# Patient Record
Sex: Female | Born: 1935 | Race: Black or African American | Hispanic: No | Smoking: Former smoker
Health system: Southern US, Community
[De-identification: ages and names within clinical notes are randomized; demographics above are authoritative.]

## PROBLEM LIST (undated history)

## (undated) DIAGNOSIS — E119 Type 2 diabetes mellitus without complications: Secondary | ICD-10-CM

## (undated) DIAGNOSIS — M1990 Unspecified osteoarthritis, unspecified site: Secondary | ICD-10-CM

## (undated) DIAGNOSIS — C801 Malignant (primary) neoplasm, unspecified: Secondary | ICD-10-CM

## (undated) DIAGNOSIS — M48061 Spinal stenosis, lumbar region without neurogenic claudication: Secondary | ICD-10-CM

## (undated) DIAGNOSIS — M549 Dorsalgia, unspecified: Secondary | ICD-10-CM

## (undated) DIAGNOSIS — I10 Essential (primary) hypertension: Secondary | ICD-10-CM

## (undated) HISTORY — PX: ABDOMINAL HYSTERECTOMY: SHX81

## (undated) HISTORY — DX: Spinal stenosis, lumbar region without neurogenic claudication: M48.061

## (undated) HISTORY — DX: Type 2 diabetes mellitus without complications: E11.9

## (undated) HISTORY — DX: Unspecified osteoarthritis, unspecified site: M19.90

---

## 2004-06-22 ENCOUNTER — Ambulatory Visit: Payer: Self-pay | Admitting: Specialist

## 2004-07-19 ENCOUNTER — Ambulatory Visit: Payer: Self-pay | Admitting: Specialist

## 2005-06-27 ENCOUNTER — Ambulatory Visit: Payer: Self-pay | Admitting: Specialist

## 2005-07-04 ENCOUNTER — Ambulatory Visit: Payer: Self-pay | Admitting: Specialist

## 2005-10-17 ENCOUNTER — Ambulatory Visit: Payer: Self-pay | Admitting: Internal Medicine

## 2005-10-23 ENCOUNTER — Ambulatory Visit: Payer: Self-pay | Admitting: Internal Medicine

## 2006-02-24 ENCOUNTER — Emergency Department: Payer: Self-pay | Admitting: General Practice

## 2006-05-15 ENCOUNTER — Ambulatory Visit: Payer: Self-pay | Admitting: Rheumatology

## 2006-07-24 ENCOUNTER — Ambulatory Visit: Payer: Self-pay | Admitting: Pain Medicine

## 2006-07-30 ENCOUNTER — Ambulatory Visit: Payer: Self-pay | Admitting: Pain Medicine

## 2006-09-04 ENCOUNTER — Ambulatory Visit: Payer: Self-pay | Admitting: Pain Medicine

## 2006-09-24 ENCOUNTER — Ambulatory Visit: Payer: Self-pay | Admitting: Pain Medicine

## 2006-11-27 ENCOUNTER — Ambulatory Visit: Payer: Self-pay | Admitting: Pain Medicine

## 2008-12-04 ENCOUNTER — Encounter: Payer: Self-pay | Admitting: Anesthesiology

## 2008-12-17 ENCOUNTER — Encounter: Payer: Self-pay | Admitting: Anesthesiology

## 2009-01-16 ENCOUNTER — Encounter: Payer: Self-pay | Admitting: Anesthesiology

## 2009-02-16 ENCOUNTER — Encounter: Payer: Self-pay | Admitting: Anesthesiology

## 2010-09-25 ENCOUNTER — Emergency Department: Payer: Self-pay | Admitting: Internal Medicine

## 2012-01-20 ENCOUNTER — Emergency Department: Payer: Self-pay | Admitting: *Deleted

## 2014-03-25 ENCOUNTER — Ambulatory Visit: Payer: Self-pay | Admitting: Ophthalmology

## 2014-04-06 ENCOUNTER — Ambulatory Visit: Payer: Self-pay | Admitting: Ophthalmology

## 2014-07-17 ENCOUNTER — Emergency Department: Payer: Self-pay | Admitting: Emergency Medicine

## 2014-10-14 ENCOUNTER — Encounter: Payer: Self-pay | Admitting: *Deleted

## 2014-10-22 ENCOUNTER — Ambulatory Visit: Payer: Self-pay | Admitting: General Surgery

## 2014-10-26 ENCOUNTER — Ambulatory Visit: Payer: Self-pay | Admitting: General Surgery

## 2015-01-09 NOTE — Op Note (Signed)
PATIENT NAME:  Ruth Rhodes, Ruth Rhodes MR#:  [id number] DATE OF BIRTH:  Jun 11, 1934  DATE OF PROCEDURE:  04/06/2014  PREOPERATIVE DIAGNOSIS:  Cataract, left eye.    POSTOPERATIVE DIAGNOSIS:  Cataract, left eye.  PROCEDURE PERFORMED:  Extracapsular cataract extraction using phacoemulsification with  placement of an Alcon [id number], 22.0-diopter posterior chamber lens, serial #[id number].  SURGEON:  Loura Back. Chayton Murata, MD  ASSISTANT:  None.  ANESTHESIA:  4% lidocaine and 0.75% Marcaine in a 50/50 mixture with 10 units/mL of Hylenex added, given as a peribulbar.   ANESTHESIOLOGIST: Dr. Benjamine Mola.  COMPLICATIONS:  None.  ESTIMATED BLOOD LOSS:  Less than 1 ml.  DESCRIPTION OF PROCEDURE:  The patient was brought to the operating room and given a peribulbar block.  The patient was then prepped and draped in the usual fashion.  The vertical rectus muscles were imbricated using 5-0 silk sutures.  These sutures were then clamped to the sterile drapes as bridle sutures.  A limbal peritomy was performed extending two clock hours and hemostasis was obtained with cautery.  A partial thickness scleral groove was made at the surgical limbus and dissected anteriorly in a lamellar dissection using an Alcon crescent knife.  The anterior chamber was entered supero-temporally with a Superblade and through the lamellar dissection with a 2.6 mm keratome.  DisCoVisc was used to replace the aqueous and a continuous tear capsulorrhexis was carried out.  Hydrodissection and hydrodelineation were carried out with balanced salt and a 27 gauge canula.  The nucleus was rotated to confirm the effectiveness of the hydrodissection.  Phacoemulsification was carried out using a divide-and-conquer technique.  Total ultrasound time was 1 minutes and 37.6 seconds with an average power of 23.8 percent and CDE of 36.72.  Irrigation/aspiration was used to remove the residual cortex.  DisCoVisc was used to inflate the capsule and the internal  incision was enlarged to 3 mm with the crescent knife.  The intraocular lens was folded and inserted into the capsular bag using the AcrySert delivery system. Irrigation/aspiration was used to remove the residual DisCoVisc.  Miostat was injected into the anterior chamber through the paracentesis track to inflate the anterior chamber and induce miosis. A tenth of a milliliter of cefuroxime was injected via the paracentesis tract containing 1 mg of drug. The wound was checked for leaks and none were found. The conjunctiva was closed with cautery and the bridle sutures were removed.  Two drops of 0.3% Vigamox were placed on the eye.   An eye shield was placed on the eye.  The patient was discharged to the recovery room in good condition.   ____________________________ Loura Back Nero Sawatzky, MD sad:sb D: 04/06/2014 10:54:50 ET T: 04/06/2014 11:20:02 ET JOB#: [id number]  cc: Remo Lipps A. Bowden Boody, MD, <Dictator> Martie Lee MD ELECTRONICALLY SIGNED 04/06/2014 12:35

## 2015-04-16 ENCOUNTER — Emergency Department
Admission: EM | Admit: 2015-04-16 | Discharge: 2015-04-17 | Disposition: A | Discharge status: Home / Self Care | Payer: Medicare Other | Attending: Emergency Medicine | Admitting: Emergency Medicine

## 2015-04-16 ENCOUNTER — Encounter: Payer: Self-pay | Admitting: *Deleted

## 2015-04-16 DIAGNOSIS — E119 Type 2 diabetes mellitus without complications: Secondary | ICD-10-CM | POA: Diagnosis not present

## 2015-04-16 DIAGNOSIS — I10 Essential (primary) hypertension: Secondary | ICD-10-CM | POA: Insufficient documentation

## 2015-04-16 DIAGNOSIS — Z79899 Other long term (current) drug therapy: Secondary | ICD-10-CM | POA: Insufficient documentation

## 2015-04-16 DIAGNOSIS — M79662 Pain in left lower leg: Secondary | ICD-10-CM | POA: Insufficient documentation

## 2015-04-16 DIAGNOSIS — R52 Pain, unspecified: Secondary | ICD-10-CM

## 2015-04-16 DIAGNOSIS — M79605 Pain in left leg: Secondary | ICD-10-CM

## 2015-04-16 DIAGNOSIS — Z791 Long term (current) use of non-steroidal anti-inflammatories (NSAID): Secondary | ICD-10-CM | POA: Insufficient documentation

## 2015-04-16 HISTORY — DX: Essential (primary) hypertension: I10

## 2015-04-16 HISTORY — DX: Unspecified osteoarthritis, unspecified site: M19.90

## 2015-04-16 HISTORY — DX: Type 2 diabetes mellitus without complications: E11.9

## 2015-04-16 NOTE — ED Notes (Addendum)
Pt states has left leg pain from knee to foot. Pt states "I have arthritis and it really hurts today." cms intact to toes. Left foot and ankle are swollen, no redness noted.

## 2015-04-17 ENCOUNTER — Encounter: Payer: Self-pay | Admitting: Emergency Medicine

## 2015-04-17 ENCOUNTER — Emergency Department: Payer: Medicare Other

## 2015-04-17 MED ORDER — ACETAMINOPHEN 325 MG PO TABS: 650.0000 mg | ORAL_TABLET | Freq: Four times a day (QID) | ORAL | Status: DC | PRN

## 2015-04-17 MED ORDER — OXYCODONE-ACETAMINOPHEN 5-325 MG PO TABS: 1.0000 | ORAL_TABLET | Freq: Four times a day (QID) | ORAL | Status: DC | PRN

## 2015-04-17 NOTE — ED Notes (Signed)
MD at bedside. 

## 2015-04-17 NOTE — ED Notes (Signed)
Patient transported to Ultrasound 

## 2015-04-17 NOTE — ED Notes (Signed)
Patient with pain and swelling to left lower extremity from knee down to her foot. Patient reports that she has arthritis and that she has problems with her leg swelling. She reports that the pain is worse today.

## 2015-04-17 NOTE — ED Provider Notes (Signed)
[facility] Emergency Department Provider Note  ____________________________________________  Time seen: 12:00 AM  I have reviewed the triage vital signs and the nursing notes.   HISTORY  Chief Complaint Leg Pain    HPI Ruth Rhodes is a 81 y.o. female who complains of pain in the left leg from the knee down to the foot is been going on for about a week. She denies any recent travel trauma hospitalization or surgery. She did have a fall at home 2 weeks ago which made her worried about her independence because she lives at home, but notes that she did not have any pain after that and does not feel that her current symptoms are related. She seen her primary care doctor who told her that it is arthritis and has not done any other things at this time. She denies any fevers chills chest pain shortness of breath. She is able to walk without difficulty. Pain is a cramping feeling in the back of the knee and the calf.     Past Medical History  Diagnosis Date  . Hypertension   . Arthritis   . Diabetes mellitus without complication     There are no active problems to display for this patient.   History reviewed. No pertinent past surgical history.  Current Outpatient Rx  Name  Route  Sig  Dispense  Refill  . clorazepate (TRANXENE) 7.5 MG tablet   Oral   Take 7.5 mg by mouth 2 (two) times daily as needed for anxiety.         . naproxen (NAPROSYN) 500 MG tablet   Oral   Take 500 mg by mouth 2 (two) times daily with a meal.         . nebivolol (BYSTOLIC) 10 MG tablet   Oral   Take 20 mg by mouth daily.         . pregabalin (LYRICA) 100 MG capsule   Oral   Take 100 mg by mouth 2 (two) times daily.         Marland Kitchen acetaminophen (TYLENOL) 325 MG tablet   Oral   Take 2 tablets (650 mg total) by mouth every 6 (six) hours as needed.   60 tablet   0     Allergies Review of patient's allergies indicates no known allergies.  History reviewed. No  pertinent family history.  Social History History  Substance Use Topics  . Smoking status: Unknown If Ever Smoked  . Smokeless tobacco: Never Used  . Alcohol Use: No    Review of Systems  Constitutional: No fever or chills. No weight changes Eyes:No blurry vision or double vision.  ENT: No sore throat. Cardiovascular: No chest pain. Respiratory: No dyspnea or cough. Gastrointestinal: Negative for abdominal pain, vomiting and diarrhea.  No BRBPR or melena. Genitourinary: Negative for dysuria, urinary retention, bloody urine, or difficulty urinating. Musculoskeletal: Left leg pain as above. Skin: Negative for rash. Neurological: Negative for headaches, focal weakness or numbness. Psychiatric:No anxiety or depression.   Endocrine:No hot/cold intolerance, changes in energy, or sleep difficulty.  10-point ROS otherwise negative.  ____________________________________________   PHYSICAL EXAM:  VITAL SIGNS: ED Triage Vitals  Enc Vitals Group     BP 04/16/15 2139 159/65 mmHg     Pulse Rate 04/16/15 2139 73     Resp 04/16/15 2139 14     Temp 04/16/15 2139 98.1 F (36.7 C)     Temp Source 04/16/15 2139 Oral     SpO2 04/16/15 2139  100 %     Weight 04/16/15 2139 130 lb (58.968 kg)     Height 04/16/15 2139 '5\' 2"'$  (1.575 m)     Head Cir --      Peak Flow --      Pain Score 04/16/15 2140 8     Pain Loc --      Pain Edu? --      Excl. in [place]? --      Constitutional: Alert and oriented. Well appearing and in no distress. Eyes: No scleral icterus. No conjunctival pallor. PERRL. EOMI ENT   Head: Normocephalic and atraumatic.   Nose: No congestion/rhinnorhea. No septal hematoma   Mouth/Throat: MMM, no pharyngeal erythema. No peritonsillar mass. No uvula shift.   Neck: No stridor. No SubQ emphysema. No meningismus. Hematological/Lymphatic/Immunilogical: No cervical lymphadenopathy. Cardiovascular: RRR. Normal and symmetric distal pulses are present in all  extremities. No murmurs, rubs, or gallops. Respiratory: Normal respiratory effort without tachypnea nor retractions. Breath sounds are clear and equal bilaterally. No wheezes/rales/rhonchi. Gastrointestinal: Soft and nontender. No distention. There is no CVA tenderness.  No rebound, rigidity, or guarding. Genitourinary: deferred Musculoskeletal: normal range of motion in all extremities. No joint effusions.  No lower extremity tenderness.  No edema. Calf circumference equal on both sides. Compartments soft, no erythema in duration or appreciable swelling. No crepitus. The patient does report tenderness to touch in the calf and behind the knee. No bony tenderness Neurologic:   Normal speech and language.  CN 2-10 normal. Motor grossly intact. No pronator drift.  Normal gait. No gross focal neurologic deficits are appreciated.  Skin:  Skin is warm, dry and intact. No rash noted.  No petechiae, purpura, or bullae. Psychiatric: Mood and affect are normal. Speech and behavior are normal. Patient exhibits appropriate insight and judgment.  ____________________________________________    LABS (pertinent positives/negatives) (all labs ordered are listed, but only abnormal results are displayed) Labs Reviewed - No data to display ____________________________________________   EKG    ____________________________________________    RADIOLOGY  Ultrasound unremarkable  ____________________________________________   PROCEDURES  ____________________________________________   INITIAL IMPRESSION / ASSESSMENT AND PLAN / ED COURSE  Pertinent labs & imaging results that were available during my care of the patient were reviewed by me and considered in my medical decision making (see chart for details).  Patient presents with left leg pain times one week. Exam is unremarkable, low suspicion for soft tissue infection necrotizing fasciitis DVT fracture or dislocation. There may be an element  of musculoskeletal pain from underlying arthritis or muscle or ligamentous strain from the fall, but nothing appreciable on exam at this time. Due to ongoing pain, we'll get an ultrasound to make sure she doesn't have a DVT. I expect she'll go to discharge home and follow-up with her primary care. ----------------------------------------- 4:17 AM on 04/17/2015 -----------------------------------------  Ultrasound unremarkable with discharge home. ____________________________________________   FINAL CLINICAL IMPRESSION(S) / ED DIAGNOSES  Final diagnoses:  Left leg pain      Carrie Mew, MD 04/17/15 [phone]

## 2015-04-17 NOTE — Discharge Instructions (Signed)
Pain of Unknown Etiology (Pain Without a Known Cause) °You have come to your caregiver because of pain. Pain can occur in any part of the body. Often there is not a definite cause. If your laboratory (blood or urine) work was normal and X-rays or other studies were normal, your caregiver may treat you without knowing the cause of the pain. An example of this is the headache. Most headaches are diagnosed by taking a history. This means your caregiver asks you questions about your headaches. Your caregiver determines a treatment based on your answers. Usually testing done for headaches is normal. Often testing is not done unless there is no response to medications. Regardless of where your pain is located today, you can be given medications to make you comfortable. If no physical cause of pain can be found, most cases of pain will gradually leave as suddenly as they came.  °If you have a painful condition and no reason can be found for the pain, it is important that you follow up with your caregiver. If the pain becomes worse or does not go away, it may be necessary to repeat tests and look further for a possible cause. °· Only take over-the-counter or prescription medicines for pain, discomfort, or fever as directed by your caregiver. °· For the protection of your privacy, test results cannot be given over the phone. Make sure you receive the results of your test. Ask how these results are to be obtained if you have not been informed. It is your responsibility to obtain your test results. °· You may continue all activities unless the activities cause more pain. When the pain lessens, it is important to gradually resume normal activities. Resume activities by beginning slowly and gradually increasing the intensity and duration of the activities or exercise. During periods of severe pain, bed rest may be helpful. Lie or sit in any position that is comfortable. °· Ice used for acute (sudden) conditions may be effective.  Use a large plastic bag filled with ice and wrapped in a towel. This may provide pain relief. °· See your caregiver for continued problems. Your caregiver can help or refer you for exercises or physical therapy if necessary. °If you were given medications for your condition, do not drive, operate machinery or power tools, or sign legal documents for 24 hours. Do not drink alcohol, take sleeping pills, or take other medications that may interfere with treatment. °See your caregiver immediately if you have pain that is becoming worse and not relieved by medications. °Document Released: 05/30/2001 Document Revised: 06/25/2013 Document Reviewed: 09/04/2005 °[employer]® Patient Information ©2015 [employer]. This information is not intended to replace advice given to you by your health care provider. Make sure you discuss any questions you have with your health care provider. ° °

## 2015-05-03 ENCOUNTER — Emergency Department
Admission: EM | Admit: 2015-05-03 | Discharge: 2015-05-03 | Disposition: A | Discharge status: Home / Self Care | Payer: Medicare Other | Attending: Emergency Medicine | Admitting: Emergency Medicine

## 2015-05-03 ENCOUNTER — Encounter: Payer: Self-pay | Admitting: Emergency Medicine

## 2015-05-03 DIAGNOSIS — M792 Neuralgia and neuritis, unspecified: Secondary | ICD-10-CM | POA: Diagnosis not present

## 2015-05-03 DIAGNOSIS — G8929 Other chronic pain: Secondary | ICD-10-CM | POA: Diagnosis not present

## 2015-05-03 DIAGNOSIS — Z87891 Personal history of nicotine dependence: Secondary | ICD-10-CM | POA: Diagnosis not present

## 2015-05-03 DIAGNOSIS — E119 Type 2 diabetes mellitus without complications: Secondary | ICD-10-CM | POA: Insufficient documentation

## 2015-05-03 DIAGNOSIS — M545 Low back pain: Secondary | ICD-10-CM | POA: Insufficient documentation

## 2015-05-03 DIAGNOSIS — I10 Essential (primary) hypertension: Secondary | ICD-10-CM | POA: Insufficient documentation

## 2015-05-03 DIAGNOSIS — M79605 Pain in left leg: Secondary | ICD-10-CM | POA: Diagnosis present

## 2015-05-03 MED ORDER — ONDANSETRON 4 MG PO TBDP
4.0000 mg | ORAL_TABLET | Freq: Once | ORAL | Status: AC
  Administered 2015-05-03: 4 mg via ORAL
  Filled 2015-05-03: qty 1

## 2015-05-03 MED ORDER — GABAPENTIN 300 MG PO CAPS: 300.0000 mg | ORAL_CAPSULE | Freq: Every day | ORAL | Status: DC

## 2015-05-03 MED ORDER — OXYCODONE-ACETAMINOPHEN 5-325 MG PO TABS
1.0000 | ORAL_TABLET | Freq: Once | ORAL | Status: AC
  Administered 2015-05-03: 1 via ORAL
  Filled 2015-05-03: qty 1

## 2015-05-03 MED ORDER — OXYCODONE-ACETAMINOPHEN 5-325 MG PO TABS: 1.0000 | ORAL_TABLET | ORAL | Status: DC | PRN

## 2015-05-03 MED ORDER — DIAZEPAM 2 MG PO TABS
1.0000 mg | ORAL_TABLET | Freq: Once | ORAL | Status: AC
  Administered 2015-05-03: 1 mg via ORAL
  Filled 2015-05-03: qty 1

## 2015-05-03 MED ORDER — DIAZEPAM 2 MG PO TABS: 2.0000 mg | ORAL_TABLET | Freq: Three times a day (TID) | ORAL | Status: DC | PRN

## 2015-05-03 MED ORDER — SODIUM CHLORIDE 0.9 % IV BOLUS (SEPSIS): 1000.0000 mL | Freq: Once | INTRAVENOUS | Status: DC

## 2015-05-03 MED ORDER — DIAZEPAM 5 MG/ML IJ SOLN
1.0000 mg | Freq: Once | INTRAMUSCULAR | Status: DC
  Filled 2015-05-03: qty 2

## 2015-05-03 MED ORDER — GABAPENTIN 300 MG PO CAPS
300.0000 mg | ORAL_CAPSULE | Freq: Once | ORAL | Status: AC
  Administered 2015-05-03: 300 mg via ORAL
  Filled 2015-05-03: qty 1

## 2015-05-03 MED ORDER — ONDANSETRON HCL 4 MG/2ML IJ SOLN
4.0000 mg | Freq: Once | INTRAMUSCULAR | Status: DC
  Filled 2015-05-03: qty 2

## 2015-05-03 NOTE — ED Notes (Signed)
Pt in with co "prickling" to inner thighs bilat, no rash noted at this time.

## 2015-05-03 NOTE — ED Notes (Signed)
Pt refused lab draw and IV meds. Pt made aware of risks. Dr. Beather Arbour notified.

## 2015-05-03 NOTE — Discharge Instructions (Signed)
1. Start gabapentin '300mg'$  nightly (#30). 2. Take medicines as needed for pain & muscle spasms (Percocet/Valium #15). 3. Return to the ER for worsening symptoms, persistent vomiting, difficulty breathing or other concerns.  Diabetic Neuropathy Diabetic neuropathy is a nerve disease or nerve damage that is caused by diabetes mellitus. About half of all people with diabetes mellitus have some form of nerve damage. Nerve damage is more common in those who have had diabetes mellitus for many years and who generally have not had good control of their blood sugar (glucose) level. Diabetic neuropathy is a common complication of diabetes mellitus. There are three more common types of diabetic neuropathy and a fourth type that is less common and less understood:   Peripheral neuropathy--This is the most common type of diabetic neuropathy. It causes damage to the nerves of the feet and legs first and then eventually the hands and arms.The damage affects the ability to sense touch.  Autonomic neuropathy--This type causes damage to the autonomic nervous system, which controls the following functions:  Heartbeat.  Body temperature.  Blood pressure.  Urination.  Digestion.  Sweating.  Sexual function.  Focal neuropathy--Focal neuropathy can be painful and unpredictable and occurs most often in older adults with diabetes mellitus. It involves a specific nerve or one area and often comes on suddenly. It usually does not cause long-term problems.  Radiculoplexus neuropathy-- Sometimes called lumbosacral radiculoplexus neuropathy, radiculoplexus neuropathy affects the nerves of the thighs, hips, buttocks, or legs. It is more common in people with type 2 diabetes mellitus and in older men. It is characterized by debilitating pain, weakness, and atrophy, usually in the thigh muscles. CAUSES  The cause of peripheral, autonomic, and focal neuropathies is diabetes mellitus that is uncontrolled and high glucose  levels. The cause of radiculoplexus neuropathy is unknown. However, it is thought to be caused by inflammation related to uncontrolled glucose levels. SIGNS AND SYMPTOMS  Peripheral Neuropathy Peripheral neuropathy develops slowly over time. When the nerves of the feet and legs no longer work there may be:   Burning, stabbing, or aching pain in the legs or feet.  Inability to feel pressure or pain in your feet. This can lead to:  Thick calluses over pressure areas.  Pressure sores.  Ulcers.  Foot deformities.  Reduced ability to feel temperature changes.  Muscle weakness. Autonomic Neuropathy The symptoms of autonomic neuropathy vary depending on which nerves are affected. Symptoms may include:  Problems with digestion, such as:  Feeling sick to your stomach (nausea).  Vomiting.  Bloating.  Constipation.  Diarrhea.  Abdominal pain.  Difficulty with urination. This occurs if you lose your ability to sense when your bladder is full. Problems include:  Urine leakage (incontinence).  Inability to empty your bladder completely (retention).  Rapid or irregular heartbeat (palpitations).  Blood pressure drops when you stand up (orthostatic hypotension). When you stand up you may feel:  Dizzy.  Weak.  Faint.  In men, inability to attain and maintain an erection.  In women, vaginal dryness and problems with decreased sexual desire and arousal.  Problems with body temperature regulation.  Increased or decreased sweating. Focal Neuropathy  Abnormal eye movements or abnormal alignment of both eyes.  Weakness in the wrist.  Foot drop. This results in an inability to lift the foot properly and abnormal walking or foot movement.  Paralysis on one side of your face (Bell palsy).  Chest or abdominal pain. Radiculoplexus Neuropathy  Sudden, severe pain in your hip, thigh, or buttocks.  Weakness and wasting of thigh muscles.  Difficulty rising from a seated  position.  Abdominal swelling.  Unexplained weight loss (usually more than 10 lb [4.5 kg]). DIAGNOSIS  Peripheral Neuropathy Your senses may be tested. Sensory function testing can be done with:  A light touch using a monofilament.  A vibration with tuning fork.  A sharp sensation with a pin prick. Other tests that can help diagnose neuropathy are:  Nerve conduction velocity. This test checks the transmission of an electrical current through a nerve.  Electromyography. This shows how muscles respond to electrical signals transmitted by nearby nerves.  Quantitative sensory testing. This is used to assess how your nerves respond to vibrations and changes in temperature. Autonomic Neuropathy Diagnosis is often based on reported symptoms. Tell your health care provider if you experience:   Dizziness.   Constipation.   Diarrhea.   Inappropriate urination or inability to urinate.   Inability to get or maintain an erection.  Tests that may be done include:   Electrocardiography or Holter monitor. These are tests that can help show problems with the heart rate or heart rhythm.   An X-ray exam may be done. Focal Neuropathy Diagnosis is made based on your symptoms and what your health care provider finds during your exam. Other tests may be done. They may include:  Nerve conduction velocities. This checks the transmission of electrical current through a nerve.  Electromyography. This shows how muscles respond to electrical signals transmitted by nearby nerves.  Quantitative sensory testing. This test is used to assess how your nerves respond to vibration and changes in temperature. Radiculoplexus Neuropathy  Often the first thing is to eliminate any other issue or problems that might be the cause, as there is no stick test for diagnosis.  X-ray exam of your spine and lumbar region.  Spinal tap to rule out cancer.  MRI to rule out other lesions. TREATMENT  Once  nerve damage occurs, it cannot be reversed. The goal of treatment is to keep the disease or nerve damage from getting worse and affecting more nerve fibers. Controlling your blood glucose level is the key. Most people with radiculoplexus neuropathy see at least a partial improvement over time. You will need to keep your blood glucose and HbA1c levels in the target range determined by your health care provider. Things that help control blood glucose levels include:   Blood glucose monitoring.   Meal planning.   Physical activity.   Diabetes medicine.  Over time, maintaining lower blood glucose levels helps lessen symptoms. Sometimes, prescription pain medicine is needed. HOME CARE INSTRUCTIONS:  Do not smoke.  Keep your blood glucose level in the range that you and your health care provider have determined acceptable for you.  Keep your blood pressure level in the range that you and your health care provider have determined acceptable for you.  Eat a well-balanced diet.  Be active every day.  Check your feet every day. SEEK MEDICAL CARE IF:   You have burning, stabbing, or aching pain in the legs or feet.  You are unable to feel pressure or pain in your feet.  You develop problems with digestion such as:  Nausea.  Vomiting.  Bloating.  Constipation.  Diarrhea.  Abdominal pain.  You have difficulty with urination, such as:  Incontinence.  Retention.  You have palpitations.  You develop orthostatic hypotension. When you stand up you may feel:  Dizzy.  Weak.  Faint.  You cannot attain and maintain an erection (  in men).  You have vaginal dryness and problems with decreased sexual desire and arousal (in women).  You have severe pain in your thighs, legs, or buttocks.  You have unexplained weight loss. Document Released: 11/13/2001 Document Revised: 06/25/2013 Document Reviewed: 02/13/2013 [facility] Patient Information 2015 [place], [place]. This  information is not intended to replace advice given to you by your health care provider. Make sure you discuss any questions you have with your health care provider.

## 2015-05-03 NOTE — ED Provider Notes (Signed)
[facility] Emergency Department Provider Note  ____________________________________________  Time seen: Approximately 4:06 AM  I have reviewed the triage vital signs and the nursing notes.   HISTORY  Chief Complaint Leg Pain    HPI Ruth Rhodes is a 81 y.o. female who presents to the ED from home with a chief complaint of "pins and needles" sensation to bilateral inner thighs. Patient states she has a history of osteoarthritis, recently evaluated in the ED s/p recent fall with negative left lower extremity Doppler who awoke prior to arrival with "prickling" sensation to both inner thighs. States she has had this previously. States she is a diabetic and sometimes experiences similar sensations in the bottom of her feet. Denies fever, chills, chest pain, shortness of breath, abdominal pain, vomiting, diarrhea, weakness.   Past Medical History  Diagnosis Date  . Hypertension   . Arthritis   . Diabetes mellitus without complication     There are no active problems to display for this patient.   History reviewed. No pertinent past surgical history.  Current Outpatient Rx  Name  Route  Sig  Dispense  Refill  . clorazepate (TRANXENE) 7.5 MG tablet   Oral   Take 7.5 mg by mouth 2 (two) times daily as needed for anxiety.         . naproxen (NAPROSYN) 500 MG tablet   Oral   Take 500 mg by mouth 2 (two) times daily with a meal.         . nebivolol (BYSTOLIC) 10 MG tablet   Oral   Take 20 mg by mouth daily.         Marland Kitchen oxyCODONE-acetaminophen (ROXICET) 5-325 MG per tablet   Oral   Take 1 tablet by mouth every 6 (six) hours as needed for severe pain.   8 tablet   0   . pregabalin (LYRICA) 100 MG capsule   Oral   Take 100 mg by mouth 2 (two) times daily.           Allergies Review of patient's allergies indicates no known allergies.  History reviewed. No pertinent family history.  Social History Social History  Substance Use Topics   . Smoking status: Former Research scientist (life sciences)  . Smokeless tobacco: Never Used  . Alcohol Use: No    Review of Systems Constitutional: No fever/chills Eyes: No visual changes. ENT: No sore throat. Cardiovascular: Denies chest pain. Respiratory: Denies shortness of breath. Gastrointestinal: No abdominal pain.  No nausea, no vomiting.  No diarrhea.  No constipation. Genitourinary: Negative for dysuria. Musculoskeletal: Positive for chronic back pain. Skin: Positive for prickling sensation to inner thighs bilaterally. Negative for rash. Neurological: Negative for headaches, focal weakness or numbness.  10-point ROS otherwise negative.  ____________________________________________   PHYSICAL EXAM:  VITAL SIGNS: ED Triage Vitals  Enc Vitals Group     BP 05/03/15 0209 169/69 mmHg     Pulse Rate 05/03/15 0209 67     Resp 05/03/15 0209 18     Temp 05/03/15 0209 98.1 F (36.7 C)     Temp Source 05/03/15 0209 Oral     SpO2 05/03/15 0209 97 %     Weight 05/03/15 0208 130 lb (58.968 kg)     Height 05/03/15 0208 '5\' 2"'$  (1.575 m)     Head Cir --      Peak Flow --      Pain Score 05/03/15 0209 10     Pain Loc --      Pain  Edu? --      Excl. in [place]? --     Constitutional: Alert and oriented. Tearful and in mild acute distress. Eyes: Conjunctivae are normal. PERRL. EOMI. Head: Atraumatic. Nose: No congestion/rhinnorhea. Mouth/Throat: Mucous membranes are moist.  Oropharynx non-erythematous. Neck: No stridor.   Cardiovascular: Normal rate, regular rhythm. Grossly normal heart sounds.  Good peripheral circulation. Respiratory: Normal respiratory effort.  No retractions. Lungs CTAB. Gastrointestinal: Soft and nontender. No distention. No abdominal bruits. No CVA tenderness. Musculoskeletal: Bilateral lower extremities symmetrically warm with 2+ femoral, popliteal, DT and PT pulses. Brisk less than 5 second capillary refill. Bilateral calves supple without swelling or tenderness. Negative Homans  sign. Bilateral thighs without evidence of external injury. There is no bruising or swelling to bilateral thighs. Patient complains of pain upon brushing my hand over the skin of her inner thighs. Neurologic:  Normal speech and language. No gross focal neurologic deficits are appreciated. No gait instability. Skin:  Skin is warm, dry and intact. No rash noted. Psychiatric: Mood and affect are anxious. Speech and behavior are normal.  ____________________________________________   LABS (all labs ordered are listed, but only abnormal results are displayed)  Labs Reviewed - No data to display ____________________________________________  EKG  None ____________________________________________  RADIOLOGY  None ____________________________________________   PROCEDURES  Procedure(s) performed: None  Critical Care performed: No  ____________________________________________   INITIAL IMPRESSION / ASSESSMENT AND PLAN / ED COURSE  Pertinent labs & imaging results that were available during my care of the patient were reviewed by me and considered in my medical decision making (see chart for details).  81 year old female complaining of neuropathic pain to bilateral inner thighs. There is no evidence for limb ischemia, compartment syndrome, necrotizing fasciitis or DVT. Will administer IV Valium for both patient's anxiety as well as possible muscle spasms. Will check electrolytes and CK. Administer gabapentin for neuropathic pain.  ----------------------------------------- 5:14 AM on 05/03/2015 -----------------------------------------  Patient refused blood draw and IV. Discussed with patient and spouse; will continue gabapentin, valium and percocet as needed for pain & muscle spasms. Close follow-up with PCP. Strict return precautions given. Both verbalize understanding and agree with plan of care. ____________________________________________   FINAL CLINICAL IMPRESSION(S) / ED  DIAGNOSES  Final diagnoses:  Neuropathic pain      Paulette Blanch, MD 05/03/15 [phone]

## 2015-05-04 ENCOUNTER — Ambulatory Visit (INDEPENDENT_AMBULATORY_CARE_PROVIDER_SITE_OTHER): Payer: Medicare Other | Admitting: Podiatry

## 2015-05-04 ENCOUNTER — Encounter: Payer: Self-pay | Admitting: Podiatry

## 2015-05-04 VITALS — BP 160/97 | HR 67 | Resp 17

## 2015-05-04 DIAGNOSIS — M205X9 Other deformities of toe(s) (acquired), unspecified foot: Secondary | ICD-10-CM

## 2015-05-04 DIAGNOSIS — L84 Corns and callosities: Secondary | ICD-10-CM | POA: Diagnosis not present

## 2015-05-04 DIAGNOSIS — B351 Tinea unguium: Secondary | ICD-10-CM

## 2015-05-04 DIAGNOSIS — M79676 Pain in unspecified toe(s): Secondary | ICD-10-CM | POA: Diagnosis not present

## 2015-05-04 MED ORDER — CICLOPIROX 8 % EX SOLN: Freq: Every day | CUTANEOUS | Status: DC

## 2015-05-04 NOTE — Patient Instructions (Signed)

## 2015-05-04 NOTE — Progress Notes (Signed)
Subjective:     Patient ID: Ruth Rhodes, female   DOB: 02/08/1934, 81 y.o.   MRN: [id number]  HPI  81 year old female presents the office today for concerns of right hallux nail fungus. She also says the remainder nails are also discolored, thick and elongated. She said no prior treatment. She states the nails are painful between the pressure and irritation. Denies any redness or drainage. No other complaints at this time.  Review of Systems  All other systems reviewed and are negative.      Objective:   Physical Exam AAO x3, NAD DP/PT pulses palpable bilaterally, CRT less than 3 seconds Protective sensation intact with Simms Weinstein monofilament, vibratory sensation decreased on the left, Achilles tendon reflex intact  nails are hypertrophic, dystrophic, brittle, discolored, elongated 10. Particularly the right hallux his most significant. There is tenderness to palpation of Nails 1-5 bilaterally. There is no swelling erythema, edema, drainage or other signs of infection. Hyperkeratotic lesions bilateral fifth digits along the dorsal lateral aspect. There is adductovarus deformity of the digits. No other areas of tenderness to bilateral lower extremities. MMT 5/5, ROM WNL.  No Other open lesions or pre-ulcerative lesions.  No overlying edema, erythema, increase in warmth to bilateral lower extremities.  No pain with calf compression, swelling, warmth, erythema bilaterally.      Assessment:      81 year old female with symptomatic onychomycosis, hyperkeratotic lesions     Plan:     -Treatment options discussed including all alternatives, risks, and complications -Nail sharply debrided 10 without complication/bleeding -Hyperkeratotic lesion sharply debrided 2 without complication/bleeding -Discussed the issue we options for onychomycosis. She  Elects to proceed with topical treatment. A prescription for Penlac was given.  Discussed applications instructions as well as side  effects. -Discussed importance daily foot inspection -Follow-up 3 months or sooner if any problems arise. In the meantime, encouraged to call the office with any questions, concerns, change in symptoms.   Celesta Gentile, DPM

## 2015-05-21 ENCOUNTER — Other Ambulatory Visit: Payer: Self-pay | Admitting: Specialist

## 2015-05-21 DIAGNOSIS — M4696 Unspecified inflammatory spondylopathy, lumbar region: Secondary | ICD-10-CM

## 2015-05-26 ENCOUNTER — Ambulatory Visit
Admission: RE | Admit: 2015-05-26 | Discharge: 2015-05-26 | Disposition: A | Discharge status: Home / Self Care | Payer: Medicare Other | Source: Ambulatory Visit | Attending: Specialist | Admitting: Specialist

## 2015-05-26 DIAGNOSIS — M4696 Unspecified inflammatory spondylopathy, lumbar region: Secondary | ICD-10-CM | POA: Diagnosis present

## 2015-05-26 DIAGNOSIS — M5136 Other intervertebral disc degeneration, lumbar region: Secondary | ICD-10-CM | POA: Diagnosis not present

## 2015-05-26 DIAGNOSIS — M2580 Other specified joint disorders, unspecified joint: Secondary | ICD-10-CM | POA: Diagnosis not present

## 2015-05-26 DIAGNOSIS — M4854XA Collapsed vertebra, not elsewhere classified, thoracic region, initial encounter for fracture: Secondary | ICD-10-CM | POA: Insufficient documentation

## 2015-05-26 DIAGNOSIS — R29890 Loss of height: Secondary | ICD-10-CM | POA: Insufficient documentation

## 2015-05-26 DIAGNOSIS — M4806 Spinal stenosis, lumbar region: Secondary | ICD-10-CM | POA: Insufficient documentation

## 2015-07-27 ENCOUNTER — Encounter: Payer: Self-pay | Admitting: Emergency Medicine

## 2015-07-27 ENCOUNTER — Emergency Department
Admission: EM | Admit: 2015-07-27 | Discharge: 2015-07-27 | Disposition: A | Discharge status: Home / Self Care | Payer: Medicare Other | Attending: Emergency Medicine | Admitting: Emergency Medicine

## 2015-07-27 DIAGNOSIS — I10 Essential (primary) hypertension: Secondary | ICD-10-CM | POA: Diagnosis not present

## 2015-07-27 DIAGNOSIS — Z79899 Other long term (current) drug therapy: Secondary | ICD-10-CM | POA: Insufficient documentation

## 2015-07-27 DIAGNOSIS — Z791 Long term (current) use of non-steroidal anti-inflammatories (NSAID): Secondary | ICD-10-CM | POA: Diagnosis not present

## 2015-07-27 DIAGNOSIS — Z87891 Personal history of nicotine dependence: Secondary | ICD-10-CM | POA: Diagnosis not present

## 2015-07-27 DIAGNOSIS — E119 Type 2 diabetes mellitus without complications: Secondary | ICD-10-CM | POA: Insufficient documentation

## 2015-07-27 DIAGNOSIS — M5441 Lumbago with sciatica, right side: Secondary | ICD-10-CM

## 2015-07-27 DIAGNOSIS — M545 Low back pain: Secondary | ICD-10-CM | POA: Diagnosis present

## 2015-07-27 HISTORY — DX: Dorsalgia, unspecified: M54.9

## 2015-07-27 MED ORDER — TAPENTADOL HCL ER 50 MG PO TB12: 50.0000 mg | ORAL_TABLET | Freq: Two times a day (BID) | ORAL | Status: DC

## 2015-07-27 MED ORDER — OXYCODONE-ACETAMINOPHEN 5-325 MG PO TABS
2.0000 | ORAL_TABLET | Freq: Once | ORAL | Status: AC
  Administered 2015-07-27: 2 via ORAL
  Filled 2015-07-27: qty 2

## 2015-07-27 NOTE — ED Notes (Signed)
Bladder scan =185m, pt voided after scan

## 2015-07-27 NOTE — ED Notes (Signed)
Pt to ed with c/o back injection on Thursday of last week.  Pt states increased pain in back since, was seen at [facility] today and told to come to ED.

## 2015-07-27 NOTE — Discharge Instructions (Signed)

## 2015-07-27 NOTE — ED Notes (Signed)
Patient states she was seeing someone At the [facility] for a "accupuncture-like" procedure. Patient got "stuck vertebrae 14 and down". Patient tearful upon questioning. "No one will do anything for me."

## 2015-07-27 NOTE — ED Provider Notes (Addendum)
[facility] Emergency Department Provider Note  Time seen: 5:26 PM  I have reviewed the triage vital signs and the nursing notes.   HISTORY  Chief Complaint Back Pain    HPI Ruth Rhodes is a 81 y.o. female with a past medical history of hypertension, arthritis, diabetes who presents the emergency department with back pain. Patient states she had a fall approximately 2 months ago. She had an MRI done at that time showing a T12 compression fracture and several areas of canal stenosis. Has been seeing a specialist and had an epidural injection performed 6 days ago, at [facility]. Patient states after the injection she was doing much better, but her pain is coming back once again. Patient admits she was prescribed OxyContin, but is not taking it because she did not want to become addicted to the medication. States occasional incontinence, but states this is because of pain and she is not able to get to the bathroom in time. States occasional tingling in her legs, but denies any new or acute tingling. Patient is able to walk but states it hurts when she walks. Describes her pain as moderate to severe, located in the lower back.    Past Medical History  Diagnosis Date  . Hypertension   . Arthritis   . Diabetes mellitus without complication ([place])   . Back pain     There are no active problems to display for this patient.   History reviewed. No pertinent past surgical history.  Current Outpatient Rx  Name  Route  Sig  Dispense  Refill  . BYSTOLIC 20 MG TABS                 Dispense as written.   . ciclopirox (PENLAC) 8 % solution   Topical   Apply topically at bedtime. Apply over nail and surrounding skin. Apply daily over previous coat. After seven (7) days, may remove with alcohol and continue cycle.   6.6 mL   2   . clorazepate (TRANXENE) 7.5 MG tablet   Oral   Take 7.5 mg by mouth 2 (two) times daily as needed for anxiety.         . diazepam  (VALIUM) 2 MG tablet   Oral   Take 1 tablet (2 mg total) by mouth every 8 (eight) hours as needed for muscle spasms.   15 tablet   0   . fluconazole (DIFLUCAN) 150 MG tablet               . gabapentin (NEURONTIN) 300 MG capsule   Oral   Take 1 capsule (300 mg total) by mouth at bedtime.   30 capsule   0   . GLYXAMBI 25-5 MG TABS                 Dispense as written.   Marland Kitchen lisinopril (PRINIVIL,ZESTRIL) 20 MG tablet               . meloxicam (MOBIC) 15 MG tablet               . naproxen (NAPROSYN) 500 MG tablet   Oral   Take 500 mg by mouth 2 (two) times daily with a meal.         . nebivolol (BYSTOLIC) 10 MG tablet   Oral   Take 20 mg by mouth daily.         Marland Kitchen oxyCODONE-acetaminophen (PERCOCET) 10-325 MG per tablet               .  oxyCODONE-acetaminophen (ROXICET) 5-325 MG per tablet   Oral   Take 1 tablet by mouth every 4 (four) hours as needed for severe pain.   15 tablet   0   . pregabalin (LYRICA) 100 MG capsule   Oral   Take 100 mg by mouth 2 (two) times daily.         . Saxagliptin-Metformin (KOMBIGLYZE XR) 01-999 MG TB24   Oral   Take by mouth.         . triamterene-hydrochlorothiazide (MAXZIDE-25) 37.5-25 MG per tablet                 Allergies Tramadol  History reviewed. No pertinent family history.  Social History Social History  Substance Use Topics  . Smoking status: Former Research scientist (life sciences)  . Smokeless tobacco: Never Used  . Alcohol Use: No    Review of Systems Constitutional: Negative for fever. Cardiovascular: Negative for chest pain. Respiratory: Negative for shortness of breath. Gastrointestinal: Negative for abdominal pain Musculoskeletal: Positive for lower back pain. Neurological: Negative for headaches, focal weakness or numbness, tingling in bilateral legs. 10-point ROS otherwise negative.  ____________________________________________   PHYSICAL EXAM:  VITAL SIGNS: ED Triage Vitals  Enc Vitals Group      BP 07/27/15 1658 153/92 mmHg     Pulse Rate 07/27/15 1658 85     Resp 07/27/15 1658 14     Temp 07/27/15 1658 98 F (36.7 C)     Temp Source 07/27/15 1658 Oral     SpO2 07/27/15 1658 98 %     Weight --      Height --      Head Cir --      Peak Flow --      Pain Score 07/27/15 1508 10     Pain Loc --      Pain Edu? --      Excl. in [place]? --     Constitutional: Alert and oriented. Well appearing and in no distress. Eyes: Normal exam ENT   Head: Normocephalic and atraumatic.   Mouth/Throat: Mucous membranes are moist. Cardiovascular: Normal rate, regular rhythm.  Respiratory: Normal respiratory effort without tachypnea nor retractions. Breath sounds are clear and equal bilaterally. No wheezes/rales/rhonchi. Gastrointestinal: Soft and nontender. No distention. Musculoskeletal: Nontender with normal range of motion in all extremities.  Neurologic:  Normal speech and language. No gross focal neurologic deficits are appreciated. Speech is normal. 5/5 motor in lower extremities. Patient states sensation is equal and intact in lower extremities however she does note a tingling sensation in bilateral thighs. Skin:  Skin is warm, dry and intact.  Psychiatric: Mood and affect are normal. Speech and behavior are normal.  ____________________________________________    INITIAL IMPRESSION / ASSESSMENT AND PLAN / ED COURSE  Pertinent labs & imaging results that were available during my care of the patient were reviewed by me and considered in my medical decision making (see chart for details).  Patient presents to the emergency department with continued back pain. She describes incontinence at times, but states this is because she cannot get to the restroom fast enough. It does not appear to be overflow incontinence, we will obtain a postvoid bladder scan to evaluate. Patient denies any acute worsening of pain, states it is chronic and progressive. Denies any new numbness, states  tingling in bilateral thighs which has been ongoing for 2 months. Patient had an MRI in September. Recent epidural injection 07/21/15. Patient states she is not taking anything but Tylenol for her pain  because she did not want to become addicted to pain medications. I discussed with the patient that her discomfort will likely last weeks to months and pain medication might be necessary. Patient is agreeable to trying Nucynta. Patient has great motor in bilateral lower extremities, overall a very normal neurologic exam. I do not suspect cauda equina syndrome. I discussed with the patient obtaining a repeat MRI given the incontinence. The patient adamantly opposes an MRI at this time. I discussed with the patient for symptoms worsen she is to return to the emergency department for the MRI, the patient is agreeable. We will do a trial of pain medication at home, follow up with her primary care physician. Patient is to return to the emergency department for any worsening symptoms.  Postvoid bladder scan shows 150 cc, and the patient was able to urinate again after that but her scan. We will discharge the patient home with Nucynta for pain control, she is follow-up with her primary care physician in one to 2 days.  ____________________________________________   FINAL CLINICAL IMPRESSION(S) / ED DIAGNOSES  Lower back pain   Harvest Dark, MD 07/27/15 1732  Harvest Dark, MD 07/27/15 1747

## 2015-08-05 ENCOUNTER — Ambulatory Visit: Payer: Medicare Other | Admitting: Podiatry

## 2015-08-16 ENCOUNTER — Emergency Department: Payer: Medicare Other

## 2015-08-16 ENCOUNTER — Emergency Department
Admission: EM | Admit: 2015-08-16 | Discharge: 2015-08-16 | Disposition: A | Discharge status: Home / Self Care | Payer: Medicare Other | Attending: Emergency Medicine | Admitting: Emergency Medicine

## 2015-08-16 DIAGNOSIS — M62838 Other muscle spasm: Secondary | ICD-10-CM | POA: Diagnosis not present

## 2015-08-16 DIAGNOSIS — Z791 Long term (current) use of non-steroidal anti-inflammatories (NSAID): Secondary | ICD-10-CM | POA: Diagnosis not present

## 2015-08-16 DIAGNOSIS — M79605 Pain in left leg: Secondary | ICD-10-CM | POA: Diagnosis not present

## 2015-08-16 DIAGNOSIS — Z79899 Other long term (current) drug therapy: Secondary | ICD-10-CM | POA: Insufficient documentation

## 2015-08-16 DIAGNOSIS — E119 Type 2 diabetes mellitus without complications: Secondary | ICD-10-CM | POA: Diagnosis not present

## 2015-08-16 DIAGNOSIS — I10 Essential (primary) hypertension: Secondary | ICD-10-CM | POA: Diagnosis not present

## 2015-08-16 DIAGNOSIS — Z87891 Personal history of nicotine dependence: Secondary | ICD-10-CM | POA: Insufficient documentation

## 2015-08-16 DIAGNOSIS — R2242 Localized swelling, mass and lump, left lower limb: Secondary | ICD-10-CM | POA: Diagnosis present

## 2015-08-16 MED ORDER — DIAZEPAM 2 MG PO TABS
1.0000 mg | ORAL_TABLET | Freq: Once | ORAL | Status: AC
  Administered 2015-08-16: 1 mg via ORAL
  Filled 2015-08-16: qty 1

## 2015-08-16 MED ORDER — DIAZEPAM 2 MG PO TABS: ORAL_TABLET | ORAL | Status: DC

## 2015-08-16 MED ORDER — IBUPROFEN 600 MG PO TABS
600.0000 mg | ORAL_TABLET | Freq: Once | ORAL | Status: AC
  Administered 2015-08-16: 600 mg via ORAL
  Filled 2015-08-16: qty 1

## 2015-08-16 MED ORDER — NAPROXEN 500 MG PO TABS: 500.0000 mg | ORAL_TABLET | Freq: Two times a day (BID) | ORAL | Status: DC

## 2015-08-16 NOTE — ED Notes (Signed)
Patient to room from ultrasound.

## 2015-08-16 NOTE — ED Notes (Signed)
Pt to u/s via w/c accomp by u/s tech

## 2015-08-16 NOTE — ED Provider Notes (Signed)
[facility] Emergency Department Provider Note  ____________________________________________  Time seen: Approximately 4:15 AM  I have reviewed the triage vital signs and the nursing notes.   HISTORY  Chief Complaint Leg Swelling    HPI Ruth Rhodes is a 81 y.o. female who presents to the ED from home with a chief complaint of left leg pain and swelling. Patient states she had a prior episode several weeks ago. Denies recent trauma or injury to leg (she had a recent injury to her back and is scheduled to have back surgery in the near future). Complains of a one-day history of left lower leg pain, cramps and swelling. Denies associated symptoms of leg weakness, numbness or tingling. Denies recent travel. Denies fever, chills, chest pain, shortness of breath, abdominal pain, nausea, vomiting, diarrhea, weakness. Nothing makes her pain better.Movement and ambulation make her pain worse.   Past Medical History  Diagnosis Date  . Hypertension   . Arthritis   . Diabetes mellitus without complication ([place])   . Back pain     There are no active problems to display for this patient.   No past surgical history on file.  Current Outpatient Rx  Name  Route  Sig  Dispense  Refill  . BYSTOLIC 20 MG TABS                 Dispense as written.   . ciclopirox (PENLAC) 8 % solution   Topical   Apply topically at bedtime. Apply over nail and surrounding skin. Apply daily over previous coat. After seven (7) days, may remove with alcohol and continue cycle.   6.6 mL   2   . clorazepate (TRANXENE) 7.5 MG tablet   Oral   Take 7.5 mg by mouth 2 (two) times daily as needed for anxiety.         . diazepam (VALIUM) 2 MG tablet   Oral   Take 1 tablet (2 mg total) by mouth every 8 (eight) hours as needed for muscle spasms.   15 tablet   0   . fluconazole (DIFLUCAN) 150 MG tablet               . gabapentin (NEURONTIN) 300 MG capsule   Oral   Take 1  capsule (300 mg total) by mouth at bedtime.   30 capsule   0   . GLYXAMBI 25-5 MG TABS                 Dispense as written.   Marland Kitchen lisinopril (PRINIVIL,ZESTRIL) 20 MG tablet               . meloxicam (MOBIC) 15 MG tablet               . naproxen (NAPROSYN) 500 MG tablet   Oral   Take 500 mg by mouth 2 (two) times daily with a meal.         . nebivolol (BYSTOLIC) 10 MG tablet   Oral   Take 20 mg by mouth daily.         Marland Kitchen oxyCODONE-acetaminophen (PERCOCET) 10-325 MG per tablet               . oxyCODONE-acetaminophen (ROXICET) 5-325 MG per tablet   Oral   Take 1 tablet by mouth every 4 (four) hours as needed for severe pain.   15 tablet   0   . pregabalin (LYRICA) 100 MG capsule   Oral   Take 100 mg  by mouth 2 (two) times daily.         . Saxagliptin-Metformin (KOMBIGLYZE XR) 01-999 MG TB24   Oral   Take by mouth.         . Tapentadol HCl (NUCYNTA ER) 50 MG TB12   Oral   Take 50 mg by mouth 2 (two) times daily.   20 tablet   0   . triamterene-hydrochlorothiazide (MAXZIDE-25) 37.5-25 MG per tablet                 Allergies Tramadol  No family history on file.  Social History Social History  Substance Use Topics  . Smoking status: Former Research scientist (life sciences)  . Smokeless tobacco: Never Used  . Alcohol Use: No    Review of Systems Constitutional: No fever/chills Eyes: No visual changes. ENT: No sore throat. Cardiovascular: Denies chest pain. Respiratory: Denies shortness of breath. Gastrointestinal: No abdominal pain.  No nausea, no vomiting.  No diarrhea.  No constipation. Genitourinary: Negative for dysuria. Musculoskeletal: Positive for left lower leg pain and swelling. Negative for back pain. Skin: Negative for rash. Neurological: Negative for headaches, focal weakness or numbness.  10-point ROS otherwise negative.  ____________________________________________   PHYSICAL EXAM:  VITAL SIGNS: ED Triage Vitals  Enc Vitals Group      BP 08/16/15 0149 175/68 mmHg     Pulse Rate 08/16/15 0149 66     Resp 08/16/15 0149 18     Temp 08/16/15 0149 97.9 F (36.6 C)     Temp Source 08/16/15 0149 Oral     SpO2 08/16/15 0149 98 %     Weight 08/16/15 0149 130 lb (58.968 kg)     Height 08/16/15 0149 '5\' 2"'$  (1.575 m)     Head Cir --      Peak Flow --      Pain Score 08/16/15 0410 10     Pain Loc --      Pain Edu? --      Excl. in [place]? --     Constitutional: Alert and oriented. Well appearing and in no acute distress. Eyes: Conjunctivae are normal. PERRL. EOMI. Head: Atraumatic. Nose: No congestion/rhinnorhea. Mouth/Throat: Mucous membranes are moist.  Oropharynx non-erythematous. Neck: No stridor.   Cardiovascular: Normal rate, regular rhythm. Grossly normal heart sounds.  Good peripheral circulation. Respiratory: Normal respiratory effort.  No retractions. Lungs CTAB. Gastrointestinal: Soft and nontender. No distention. No abdominal bruits. No CVA tenderness. Musculoskeletal: Left lower extremity with no visibly appreciable swelling. There is no warmth or erythema. Equal calf circumference bilaterally at 34 cm. Left calf is supple but tender to palpation. No joint effusions. 2+ femoral, popliteal and distal pulses. Symmetrically warm limbs without evidence for ischemia. Brisk, less than 5 second capillary refill. Neurologic:  Normal speech and language. No gross focal neurologic deficits are appreciated. No gait instability. Skin:  Skin is warm, dry and intact. No rash noted. Psychiatric: Mood and affect are normal. Speech and behavior are normal.  ____________________________________________   LABS (all labs ordered are listed, but only abnormal results are displayed)  Labs Reviewed - No data to display ____________________________________________  EKG  None ____________________________________________  RADIOLOGY  Left lower extremity Doppler ultrasound interpreted per Dr. Alroy Dust: No evidence of deep venous  thrombosis. ____________________________________________   PROCEDURES  Procedure(s) performed: None  Critical Care performed: No  ____________________________________________   INITIAL IMPRESSION / ASSESSMENT AND PLAN / ED COURSE  Pertinent labs & imaging results that were available during my care of the patient were reviewed by  me and considered in my medical decision making (see chart for details).  81 year old female who presents with left lower extremity pain and cramps. Doppler ultrasound negative for DVT. Patient burst into tears at my suggestion for checking lab work for electrolytes and CK as patient is on a diuretic. We will hold off on lab draw as this upset the patient greatly. Will treat with NSAIDs and mild muscle relaxer. Strict return precautions given. Patient verbalizes understanding and agrees with plan of care. ____________________________________________   FINAL CLINICAL IMPRESSION(S) / ED DIAGNOSES  Final diagnoses:  Muscle spasm of left lower extremity  Pain of left lower extremity      Paulette Blanch, MD 08/16/15 [phone]

## 2015-08-16 NOTE — ED Notes (Signed)
Pt ambulatory to triage with no difficulty. Pt reports pain to her left lower leg with swelling that started on Sunday. Pt denies injury but does report recent injury to her back reporting she is scheduled to have back surgery in the near future.

## 2015-08-16 NOTE — Discharge Instructions (Signed)
1. You may take medicines as needed for pain and muscle cramps (Naprosyn/Valium). 2. Apply moist heat to affected area several times daily. 3. Return to the ER for worsening symptoms, persistent vomiting, difficulty breathing or other concerns.  Muscle Cramps and Spasms Muscle cramps and spasms occur when a muscle or muscles tighten and you have no control over this tightening (involuntary muscle contraction). They are a common problem and can develop in any muscle. The most common place is in the calf muscles of the leg. Both muscle cramps and muscle spasms are involuntary muscle contractions, but they also have differences:   Muscle cramps are sporadic and painful. They may last a few seconds to a quarter of an hour. Muscle cramps are often more forceful and last longer than muscle spasms.  Muscle spasms may or may not be painful. They may also last just a few seconds or much longer. CAUSES  It is uncommon for cramps or spasms to be due to a serious underlying problem. In many cases, the cause of cramps or spasms is unknown. Some common causes are:   Overexertion.   Overuse from repetitive motions (doing the same thing over and over).   Remaining in a certain position for a long period of time.   Improper preparation, form, or technique while performing a sport or activity.   Dehydration.   Injury.   Side effects of some medicines.   Abnormally low levels of the salts and ions in your blood (electrolytes), especially potassium and calcium. This could happen if you are taking water pills (diuretics) or you are pregnant.  Some underlying medical problems can make it more likely to develop cramps or spasms. These include, but are not limited to:   Diabetes.   Parkinson disease.   Hormone disorders, such as thyroid problems.   Alcohol abuse.   Diseases specific to muscles, joints, and bones.   Blood vessel disease where not enough blood is getting to the muscles.   HOME CARE INSTRUCTIONS   Stay well hydrated. Drink enough water and fluids to keep your urine clear or pale yellow.  It may be helpful to massage, stretch, and relax the affected muscle.  For tight or tense muscles, use a warm towel, heating pad, or hot shower water directed to the affected area.  If you are sore or have pain after a cramp or spasm, applying ice to the affected area may relieve discomfort.  Put ice in a plastic bag.  Place a towel between your skin and the bag.  Leave the ice on for 15-20 minutes, 03-04 times a day.  Medicines used to treat a known cause of cramps or spasms may help reduce their frequency or severity. Only take over-the-counter or prescription medicines as directed by your caregiver. SEEK MEDICAL CARE IF:  Your cramps or spasms get more severe, more frequent, or do not improve over time.  MAKE SURE YOU:   Understand these instructions.  Will watch your condition.  Will get help right away if you are not doing well or get worse.   This information is not intended to replace advice given to you by your health care provider. Make sure you discuss any questions you have with your health care provider.   Document Released: 02/24/2002 Document Revised: 12/30/2012 Document Reviewed: 08/21/2012 Elsevier Interactive Patient Education 2016 Elsevier Inc.  Musculoskeletal Pain Musculoskeletal pain is muscle and boney aches and pains. These pains can occur in any part of the body. Your caregiver may treat  you without knowing the cause of the pain. They may treat you if blood or urine tests, X-rays, and other tests were normal.  CAUSES There is often not a definite cause or reason for these pains. These pains may be caused by a type of germ (virus). The discomfort may also come from overuse. Overuse includes working out too hard when your body is not fit. Boney aches also come from weather changes. Bone is sensitive to atmospheric pressure changes. HOME  CARE INSTRUCTIONS   Ask when your test results will be ready. Make sure you get your test results.  Only take over-the-counter or prescription medicines for pain, discomfort, or fever as directed by your caregiver. If you were given medications for your condition, do not drive, operate machinery or power tools, or sign legal documents for 24 hours. Do not drink alcohol. Do not take sleeping pills or other medications that may interfere with treatment.  Continue all activities unless the activities cause more pain. When the pain lessens, slowly resume normal activities. Gradually increase the intensity and duration of the activities or exercise.  During periods of severe pain, bed rest may be helpful. Lay or sit in any position that is comfortable.  Putting ice on the injured area.  Put ice in a bag.  Place a towel between your skin and the bag.  Leave the ice on for 15 to 20 minutes, 3 to 4 times a day.  Follow up with your caregiver for continued problems and no reason can be found for the pain. If the pain becomes worse or does not go away, it may be necessary to repeat tests or do additional testing. Your caregiver may need to look further for a possible cause. SEEK IMMEDIATE MEDICAL CARE IF:  You have pain that is getting worse and is not relieved by medications.  You develop chest pain that is associated with shortness or breath, sweating, feeling sick to your stomach (nauseous), or throw up (vomit).  Your pain becomes localized to the abdomen.  You develop any new symptoms that seem different or that concern you. MAKE SURE YOU:   Understand these instructions.  Will watch your condition.  Will get help right away if you are not doing well or get worse.   This information is not intended to replace advice given to you by your health care provider. Make sure you discuss any questions you have with your health care provider.   Document Released: 09/04/2005 Document Revised:  11/27/2011 Document Reviewed: 05/09/2013 Elsevier Interactive Patient Education 2016 [place] therapy can help ease sore, stiff, injured, and tight muscles and joints. Heat relaxes your muscles, which may help ease your pain. Heat therapy should only be used on old, pre-existing, or long-lasting (chronic) injuries. Do not use heat therapy unless told by your doctor. HOW TO USE HEAT THERAPY There are several different kinds of heat therapy, including:  Moist heat pack.  Warm water bath.  Hot water bottle.  Electric heating pad.  Heated gel pack.  Heated wrap.  Electric heating pad. GENERAL HEAT THERAPY RECOMMENDATIONS   Do not sleep while using heat therapy. Only use heat therapy while you are awake.  Your skin may turn pink while using heat therapy. Do not use heat therapy if your skin turns red.  Do not use heat therapy if you have new pain.  High heat or long exposure to heat can cause burns. Be careful when using heat therapy to avoid burning  your skin.  Do not use heat therapy on areas of your skin that are already irritated, such as with a rash or sunburn. GET HELP IF:   You have blisters, redness, swelling (puffiness), or numbness.  You have new pain.  Your pain is worse. MAKE SURE YOU:  Understand these instructions.  Will watch your condition.  Will get help right away if you are not doing well or get worse.   This information is not intended to replace advice given to you by your health care provider. Make sure you discuss any questions you have with your health care provider.   Document Released: 11/27/2011 Document Revised: 09/25/2014 Document Reviewed: 10/28/2013 Elsevier Interactive Patient Education [employer].

## 2015-09-03 ENCOUNTER — Other Ambulatory Visit (HOSPITAL_COMMUNITY): Payer: Self-pay | Admitting: Neurosurgery

## 2015-09-17 ENCOUNTER — Encounter (HOSPITAL_COMMUNITY)
Admission: RE | Admit: 2015-09-17 | Discharge: 2015-09-17 | Disposition: A | Discharge status: Home / Self Care | Payer: Medicare Other | Source: Ambulatory Visit | Attending: Neurosurgery | Admitting: Neurosurgery

## 2015-09-17 ENCOUNTER — Encounter (HOSPITAL_COMMUNITY): Payer: Self-pay

## 2015-09-17 DIAGNOSIS — Z7984 Long term (current) use of oral hypoglycemic drugs: Secondary | ICD-10-CM | POA: Insufficient documentation

## 2015-09-17 DIAGNOSIS — Z01812 Encounter for preprocedural laboratory examination: Secondary | ICD-10-CM | POA: Diagnosis not present

## 2015-09-17 DIAGNOSIS — E119 Type 2 diabetes mellitus without complications: Secondary | ICD-10-CM | POA: Insufficient documentation

## 2015-09-17 DIAGNOSIS — Z79899 Other long term (current) drug therapy: Secondary | ICD-10-CM | POA: Diagnosis not present

## 2015-09-17 DIAGNOSIS — Z87891 Personal history of nicotine dependence: Secondary | ICD-10-CM | POA: Diagnosis not present

## 2015-09-17 DIAGNOSIS — Z01818 Encounter for other preprocedural examination: Secondary | ICD-10-CM | POA: Diagnosis present

## 2015-09-17 DIAGNOSIS — I10 Essential (primary) hypertension: Secondary | ICD-10-CM | POA: Insufficient documentation

## 2015-09-17 DIAGNOSIS — M549 Dorsalgia, unspecified: Secondary | ICD-10-CM | POA: Diagnosis not present

## 2015-09-17 DIAGNOSIS — M4806 Spinal stenosis, lumbar region: Secondary | ICD-10-CM | POA: Insufficient documentation

## 2015-09-17 DIAGNOSIS — R9431 Abnormal electrocardiogram [ECG] [EKG]: Secondary | ICD-10-CM | POA: Insufficient documentation

## 2015-09-17 DIAGNOSIS — M5127 Other intervertebral disc displacement, lumbosacral region: Secondary | ICD-10-CM | POA: Diagnosis not present

## 2015-09-17 LAB — CBC
HCT: 37.5 % (ref 36.0–46.0)
HEMOGLOBIN: 13 g/dL (ref 12.0–15.0)
MCH: 28.8 pg (ref 26.0–34.0)
MCHC: 34.7 g/dL (ref 30.0–36.0)
MCV: 83 fL (ref 78.0–100.0)
Platelets: 244 10*3/uL (ref 150–400)
RBC: 4.52 MIL/uL (ref 3.87–5.11)
RDW: 14.3 % (ref 11.5–15.5)
WBC: 5 10*3/uL (ref 4.0–10.5)

## 2015-09-17 LAB — BASIC METABOLIC PANEL
ANION GAP: 11 (ref 5–15)
BUN: 15 mg/dL (ref 6–20)
CALCIUM: 9.6 mg/dL (ref 8.9–10.3)
CHLORIDE: 108 mmol/L (ref 101–111)
CO2: 24 mmol/L (ref 22–32)
Creatinine, Ser: 0.74 mg/dL (ref 0.44–1.00)
GFR calc non Af Amer: >60 mL/min (ref >60)
Glucose, Bld: 187 mg/dL — ABNORMAL HIGH (ref 65–99)
Potassium: 3.7 mmol/L (ref 3.5–5.1)
Sodium: 143 mmol/L (ref 135–145)

## 2015-09-17 LAB — SURGICAL PCR SCREEN
MRSA, PCR: NEGATIVE
Staphylococcus aureus: NEGATIVE

## 2015-09-17 LAB — GLUCOSE, CAPILLARY: Glucose-Capillary: 165 mg/dL — ABNORMAL HIGH (ref 65–99)

## 2015-09-17 NOTE — Progress Notes (Signed)
PCP is Dr Nicky Pugh Denies ever seeing a cardiologist. Denies ever having a stress test, echo, or card cath. States that she doesn't check her blood sugar, and doesn't have a cbg machine at home.

## 2015-09-17 NOTE — Pre-Procedure Instructions (Signed)
Ruth Rhodes  09/17/2015      [facility] - [place], [place] - [address] [place] [address] Phone: [phone] Fax: [phone]    Your procedure is scheduled on Jan 5th  Report to [place] at 1015 A.M.  Call this number if you have problems the morning of surgery:  [phone]   Remember:  Do not eat food or drink liquids after midnight.  Take these medicines the morning of surgery with A SIP OF WATER gabapentin (Neurontin), Nebivolol (Bystolic), methocarbamol (Robaxin), Oxyc Oxycodone-acetaminophen (Roxicet) if needed  Stop taking Asprin, Ibuprofen, Advil, Aleve, BC's, Goody's, Herbal medication, Fish Oil, Naproxen. How to Manage Your Diabetes Before Surgery   Why is it important to control my blood sugar before and after surgery?   Improving blood sugar levels before and after surgery helps healing and can limit problems.  A way of improving blood sugar control is eating a healthy diet by:  - Eating less sugar and carbohydrates  - Increasing activity/exercise  - Talk with your doctor about reaching your blood sugar goals  High blood sugars (greater than 180 mg/dL) can raise your risk of infections and slow down your recovery so you will need to focus on controlling your diabetes during the weeks before surgery.  Make sure that the doctor who takes care of your diabetes knows about your planned surgery including the date and location.  How do I manage my blood sugars before surgery?   Check your blood sugar at least 4 times a day, 2 days before surgery to make sure that they are not too high or low.   Check your blood sugar the morning of your surgery when you wake up and every 2  hours until you get to the Short-Stay unit.  If your blood sugar is less than 70 mg/dL, you will need to treat for low blood sugar by:  Treat a low blood sugar (less than 70 mg/dL) with 1/2 cup of clear juice (cranberry or  apple), 4 glucose tablets, OR glucose gel.  Recheck blood sugar in 15 minutes after treatment (to make sure it is greater than 70 mg/dL).  If blood sugar is not greater than 70 mg/dL on re-check, call [phone] for further instructions.   Report your blood sugar to the Short-Stay nurse when you get to Short-Stay.  References:  [facility], 2007 "How to Manage your Diabetes Before and After Surgery".  What do I do about my diabetes medications?   Do not take oral diabetes medicines (pills) the morning of surgery. Glyxambi    If your CBG is greater than 220 mg/dL, you may take 1/2 of your sliding scale (correction) dose of insulin.   Do not wear jewelry, make-up or nail polish.  Do not wear lotions, powders, or perfumes.  You may wear deodorant.  Do not shave 48 hours prior to surgery.  Men may shave face and neck.  Do not bring valuables to the hospital.  [facility] is not responsible for any belongings or valuables.  Contacts, dentures or bridgework may not be worn into surgery.  Leave your suitcase in the car.  After surgery it may be brought to your room.  For patients admitted to the hospital, discharge time will be determined by your treatment team.  Patients discharged the day of surgery will not be allowed to drive home.    Special instructions: [place] - Preparing for Surgery  Before surgery, you can play an important role.  Because skin is not sterile, your skin needs to be as free of germs as possible.  You can reduce the number of germs on you skin by washing with CHG (chlorahexidine gluconate) soap before surgery.  CHG is an antiseptic cleaner which kills germs and bonds with the skin to continue killing germs even after washing.  Please DO NOT use if you have an allergy to CHG or antibacterial soaps.  If your skin becomes reddened/irritated stop using the CHG and inform your nurse when you arrive at Short Stay.  Do not shave  (including legs and underarms) for at least 48 hours prior to the first CHG shower.  You may shave your face.  Please follow these instructions carefully:   1.  Shower with CHG Soap the night before surgery and the    morning of Surgery.  2.  If you choose to wash your hair, wash your hair first as usual with your normal shampoo.  3.  After you shampoo, rinse your hair and body thoroughly to remove the   Shampoo.  4.  Use CHG as you would any other liquid soap.  You can apply chg directly   to the skin and wash gently with scrungie or a clean washcloth.  5.  Apply the CHG Soap to your body ONLY FROM THE NECK DOWN.    Do not use on open wounds or open sores.  Avoid contact with your eyes,  ears, mouth and genitals (private parts).  Wash genitals (private parts) with your normal soap.  6.  Wash thoroughly, paying special attention to the area where your surgery  will be performed.  7.  Thoroughly rinse your body with warm water from the neck down.  8.  DO NOT shower/wash with your normal soap after using and rinsing off   the CHG Soap.  9.  Pat yourself dry with a clean towel.            10.  Wear clean pajamas.            11.  Place clean sheets on your bed the night of your first shower and do not        sleep with pets.  Day of Surgery  Do not apply any lotions/deoderants the morning of surgery.  Please wear clean clothes to the hospital/surgery center.     Please read over the following fact sheets that you were given. Pain Booklet, Coughing and Deep Breathing, MRSA Information and Surgical Site Infection Prevention

## 2015-09-18 LAB — HEMOGLOBIN A1C
Hgb A1c MFr Bld: 8.9 % — ABNORMAL HIGH (ref 4.8–5.6)
Mean Plasma Glucose: 209 mg/dL

## 2015-09-21 NOTE — Progress Notes (Addendum)
Anesthesia chart review: Patient is a 82 year old female scheduled for laminectomy and foraminotomy L4-5 bilateral, Coflex L5-S1, left on 09/23/2015 by Dr. Hal Neer.  History includes former smoker, hypertension, diabetes mellitus type 2, back pain, arthritis, hysterectomy. PCP is listed as Dr. Nicky Pugh. She was evaluated by cardiologist Dr. Lujean Amel in 03/2014 for a pre-operative evaluation for eye surgery due to finding of murmur on physical exam. According to his note, she reported a previous cardiac work-up several years ago that was "okay."  Meds include by systolic, Neurontin, lisinopril, Robaxin, Naprosyn, Roxicet,Glyxambi.   09/17/15 EKG: NSR, inferior infarct (age undetermined), T wave abnormality, consider anterolateral ischemia. T wave changes less prominent compared to previous tracing 03/25/14.   04/01/14 Echo (Care Everywhere): INTERPRETATION NORMAL LEFT VENTRICULAR SYSTOLIC FUNCTION WITH MODERATE LVH MILD VALVULAR REGURGITATION (Mild AR/MR/TR/PR) NO VALVULAR STENOSIS IAS ANEURYSM Mild PHTN Normal left ventricular function ejection fraction of between 50 and 55%  Preoperative labs noted. A1C 8.9.   Discussed with anesthesiologist Dr. Linna Caprice. Abnormal EKG is overall similar to 03/2014. However, due to CAD risk factors including HTN and  uncontrolled DM would recommend cardiology clearance. It doesn't look like she has had a recent stress test. She was seen by Dr. Clayborn Bigness last year prior to cataract extraction with echo only by records available to me. He will need to determine if additional testing is needed prior to this surgery. Jessica at Dr. Sande Rives office notified.   George Hugh [facility] Short Stay Center/Anesthesiology Phone [phone] 09/21/2015 3:02 PM   Addendum: Dr. Clayborn Bigness was sent my note and the last two EKGs in Epic for review. Today he signed a note of cardiac clearance for this procedure.  George Hugh [facility] Short Stay  Center/Anesthesiology Phone [phone] 09/22/2015 3:29 PM

## 2015-09-22 MED ORDER — DEXAMETHASONE SODIUM PHOSPHATE 10 MG/ML IJ SOLN
10.0000 mg | INTRAMUSCULAR | Status: AC
  Administered 2015-09-23: 10 mg via INTRAVENOUS
  Filled 2015-09-22: qty 1

## 2015-09-22 MED ORDER — CEFAZOLIN SODIUM-DEXTROSE 2-3 GM-% IV SOLR
2.0000 g | INTRAVENOUS | Status: AC
  Administered 2015-09-23: 2 g via INTRAVENOUS
  Filled 2015-09-22: qty 50

## 2015-09-23 ENCOUNTER — Inpatient Hospital Stay (HOSPITAL_COMMUNITY): Payer: Medicare Other | Admitting: Vascular Surgery

## 2015-09-23 ENCOUNTER — Inpatient Hospital Stay (HOSPITAL_COMMUNITY): Payer: Medicare Other | Admitting: Certified Registered Nurse Anesthetist

## 2015-09-23 ENCOUNTER — Encounter (HOSPITAL_COMMUNITY): Payer: Self-pay | Admitting: *Deleted

## 2015-09-23 ENCOUNTER — Inpatient Hospital Stay (HOSPITAL_COMMUNITY): Payer: Medicare Other

## 2015-09-23 ENCOUNTER — Inpatient Hospital Stay (HOSPITAL_COMMUNITY)
Admission: RE | Admit: 2015-09-23 | Discharge: 2015-09-28 | DRG: 520 | Disposition: A | Discharge status: Skilled Nursing Facility | Payer: Medicare Other | Source: Ambulatory Visit | Attending: Neurosurgery | Admitting: Neurosurgery

## 2015-09-23 ENCOUNTER — Encounter (HOSPITAL_COMMUNITY)
Admission: RE | Disposition: A | Discharge status: Skilled Nursing Facility | Payer: Self-pay | Source: Ambulatory Visit | Attending: Neurosurgery

## 2015-09-23 DIAGNOSIS — Z888 Allergy status to other drugs, medicaments and biological substances status: Secondary | ICD-10-CM

## 2015-09-23 DIAGNOSIS — M4806 Spinal stenosis, lumbar region: Secondary | ICD-10-CM | POA: Diagnosis present

## 2015-09-23 DIAGNOSIS — Z7984 Long term (current) use of oral hypoglycemic drugs: Secondary | ICD-10-CM | POA: Diagnosis not present

## 2015-09-23 DIAGNOSIS — Z79899 Other long term (current) drug therapy: Secondary | ICD-10-CM

## 2015-09-23 DIAGNOSIS — E119 Type 2 diabetes mellitus without complications: Secondary | ICD-10-CM | POA: Diagnosis present

## 2015-09-23 DIAGNOSIS — Z87891 Personal history of nicotine dependence: Secondary | ICD-10-CM

## 2015-09-23 DIAGNOSIS — M48062 Spinal stenosis, lumbar region with neurogenic claudication: Secondary | ICD-10-CM | POA: Diagnosis present

## 2015-09-23 DIAGNOSIS — Z419 Encounter for procedure for purposes other than remedying health state, unspecified: Secondary | ICD-10-CM

## 2015-09-23 DIAGNOSIS — M549 Dorsalgia, unspecified: Secondary | ICD-10-CM | POA: Diagnosis present

## 2015-09-23 DIAGNOSIS — I10 Essential (primary) hypertension: Secondary | ICD-10-CM | POA: Diagnosis present

## 2015-09-23 HISTORY — PX: LUMBAR LAMINECTOMY WITH COFLEX 2 LEVEL: SHX6515

## 2015-09-23 LAB — GLUCOSE, CAPILLARY
GLUCOSE-CAPILLARY: 121 mg/dL — AB (ref 65–99)
GLUCOSE-CAPILLARY: 85 mg/dL (ref 65–99)
GLUCOSE-CAPILLARY: 146 mg/dL — AB (ref 65–99)
GLUCOSE-CAPILLARY: 292 mg/dL — AB (ref 65–99)
Glucose-Capillary: 103 mg/dL — ABNORMAL HIGH (ref 65–99)
Glucose-Capillary: 71 mg/dL (ref 65–99)
Glucose-Capillary: 157 mg/dL — ABNORMAL HIGH (ref 65–99)

## 2015-09-23 SURGERY — LUMBAR LAMINECTOMY WITH COFLEX 2 LEVEL
Anesthesia: General | Site: Spine Lumbar | Laterality: Bilateral

## 2015-09-23 MED ORDER — SODIUM CHLORIDE 0.9 % IJ SOLN
INTRAMUSCULAR | Status: AC
  Filled 2015-09-23: qty 10

## 2015-09-23 MED ORDER — FENTANYL CITRATE (PF) 250 MCG/5ML IJ SOLN
INTRAMUSCULAR | Status: AC
  Filled 2015-09-23: qty 5

## 2015-09-23 MED ORDER — LISINOPRIL 20 MG PO TABS
20.0000 mg | ORAL_TABLET | Freq: Every day | ORAL | Status: DC
  Administered 2015-09-24 – 2015-09-28 (×4): 20 mg via ORAL
  Filled 2015-09-23 (×5): qty 1

## 2015-09-23 MED ORDER — EPHEDRINE SULFATE 50 MG/ML IJ SOLN
INTRAMUSCULAR | Status: AC
  Filled 2015-09-23: qty 1

## 2015-09-23 MED ORDER — INSULIN ASPART 100 UNIT/ML ~~LOC~~ SOLN
0.0000 [IU] | Freq: Every day | SUBCUTANEOUS | Status: DC
  Administered 2015-09-23: 3 [IU] via SUBCUTANEOUS

## 2015-09-23 MED ORDER — ARTIFICIAL TEARS OP OINT
TOPICAL_OINTMENT | OPHTHALMIC | Status: AC
  Filled 2015-09-23: qty 3.5

## 2015-09-23 MED ORDER — HEMOSTATIC AGENTS (NO CHARGE) OPTIME
TOPICAL | Status: DC | PRN
  Administered 2015-09-23: 1 via TOPICAL

## 2015-09-23 MED ORDER — ROCURONIUM BROMIDE 50 MG/5ML IV SOLN
INTRAVENOUS | Status: AC
  Filled 2015-09-23: qty 1

## 2015-09-23 MED ORDER — LIDOCAINE HCL (CARDIAC) 20 MG/ML IV SOLN
INTRAVENOUS | Status: AC
  Filled 2015-09-23: qty 5

## 2015-09-23 MED ORDER — PHENOL 1.4 % MT LIQD: 1.0000 | OROMUCOSAL | Status: DC | PRN

## 2015-09-23 MED ORDER — INSULIN ASPART 100 UNIT/ML ~~LOC~~ SOLN
4.0000 [IU] | Freq: Three times a day (TID) | SUBCUTANEOUS | Status: DC
  Administered 2015-09-25 – 2015-09-26 (×2): 4 [IU] via SUBCUTANEOUS

## 2015-09-23 MED ORDER — SODIUM CHLORIDE 0.9 % IV SOLN: 250.0000 mL | INTRAVENOUS | Status: DC

## 2015-09-23 MED ORDER — SODIUM CHLORIDE 0.9 % IJ SOLN
3.0000 mL | Freq: Two times a day (BID) | INTRAMUSCULAR | Status: DC
  Administered 2015-09-26: 3 mL via INTRAVENOUS

## 2015-09-23 MED ORDER — PANTOPRAZOLE SODIUM 40 MG PO TBEC
40.0000 mg | DELAYED_RELEASE_TABLET | Freq: Every day | ORAL | Status: DC
  Administered 2015-09-23 – 2015-09-28 (×5): 40 mg via ORAL
  Filled 2015-09-23 (×6): qty 1

## 2015-09-23 MED ORDER — FENTANYL CITRATE (PF) 100 MCG/2ML IJ SOLN
INTRAMUSCULAR | Status: DC | PRN
  Administered 2015-09-23: 150 ug via INTRAVENOUS
  Administered 2015-09-23 (×2): 50 ug via INTRAVENOUS

## 2015-09-23 MED ORDER — 0.9 % SODIUM CHLORIDE (POUR BTL) OPTIME
TOPICAL | Status: DC | PRN
  Administered 2015-09-23: 1000 mL

## 2015-09-23 MED ORDER — NEBIVOLOL HCL 10 MG PO TABS
20.0000 mg | ORAL_TABLET | Freq: Every day | ORAL | Status: DC
  Administered 2015-09-24 – 2015-09-28 (×4): 20 mg via ORAL
  Filled 2015-09-23 (×5): qty 2

## 2015-09-23 MED ORDER — LACTATED RINGERS IV SOLN
INTRAVENOUS | Status: DC
  Administered 2015-09-23 (×2): via INTRAVENOUS

## 2015-09-23 MED ORDER — SUCCINYLCHOLINE CHLORIDE 20 MG/ML IJ SOLN
INTRAMUSCULAR | Status: AC
  Filled 2015-09-23: qty 1

## 2015-09-23 MED ORDER — METHOCARBAMOL 500 MG PO TABS
500.0000 mg | ORAL_TABLET | Freq: Four times a day (QID) | ORAL | Status: DC | PRN
  Administered 2015-09-23 – 2015-09-28 (×9): 500 mg via ORAL
  Filled 2015-09-23 (×12): qty 1

## 2015-09-23 MED ORDER — ONDANSETRON HCL 4 MG/2ML IJ SOLN
INTRAMUSCULAR | Status: AC
  Filled 2015-09-23: qty 2

## 2015-09-23 MED ORDER — EPHEDRINE SULFATE 50 MG/ML IJ SOLN
INTRAMUSCULAR | Status: DC | PRN
  Administered 2015-09-23: 5 mg via INTRAVENOUS
  Administered 2015-09-23: 10 mg via INTRAVENOUS

## 2015-09-23 MED ORDER — EMPAGLIFLOZIN-LINAGLIPTIN 25-5 MG PO TABS: 1.0000 | ORAL_TABLET | Freq: Every day | ORAL | Status: DC

## 2015-09-23 MED ORDER — NEOSTIGMINE METHYLSULFATE 10 MG/10ML IV SOLN
INTRAVENOUS | Status: DC | PRN
  Administered 2015-09-23: 3 mg via INTRAVENOUS

## 2015-09-23 MED ORDER — OXYCODONE-ACETAMINOPHEN 5-325 MG PO TABS
ORAL_TABLET | ORAL | Status: AC
  Filled 2015-09-23: qty 2

## 2015-09-23 MED ORDER — OXYCODONE-ACETAMINOPHEN 5-325 MG PO TABS
1.0000 | ORAL_TABLET | ORAL | Status: DC | PRN
  Administered 2015-09-23 – 2015-09-28 (×19): 2 via ORAL
  Filled 2015-09-23 (×18): qty 2

## 2015-09-23 MED ORDER — LINAGLIPTIN 5 MG PO TABS
5.0000 mg | ORAL_TABLET | Freq: Every day | ORAL | Status: DC
  Administered 2015-09-24 – 2015-09-28 (×4): 5 mg via ORAL
  Filled 2015-09-23 (×5): qty 1

## 2015-09-23 MED ORDER — HYDROMORPHONE HCL 1 MG/ML IJ SOLN
INTRAMUSCULAR | Status: AC
  Filled 2015-09-23: qty 1

## 2015-09-23 MED ORDER — GABAPENTIN 400 MG PO CAPS
400.0000 mg | ORAL_CAPSULE | Freq: Three times a day (TID) | ORAL | Status: DC
  Administered 2015-09-23 – 2015-09-28 (×14): 400 mg via ORAL
  Filled 2015-09-23 (×8): qty 1
  Filled 2015-09-23: qty 4
  Filled 2015-09-23 (×4): qty 1

## 2015-09-23 MED ORDER — CANAGLIFLOZIN 100 MG PO TABS
100.0000 mg | ORAL_TABLET | Freq: Every day | ORAL | Status: DC
  Administered 2015-09-24 – 2015-09-28 (×4): 100 mg via ORAL
  Filled 2015-09-23 (×7): qty 1

## 2015-09-23 MED ORDER — ACETAMINOPHEN 650 MG RE SUPP: 650.0000 mg | RECTAL | Status: DC | PRN

## 2015-09-23 MED ORDER — GLYCOPYRROLATE 0.2 MG/ML IJ SOLN
INTRAMUSCULAR | Status: DC | PRN
  Administered 2015-09-23: .4 mg via INTRAVENOUS

## 2015-09-23 MED ORDER — BUPIVACAINE HCL (PF) 0.5 % IJ SOLN
INTRAMUSCULAR | Status: DC | PRN
  Administered 2015-09-23: 20 mL

## 2015-09-23 MED ORDER — ACETAMINOPHEN 325 MG PO TABS
650.0000 mg | ORAL_TABLET | ORAL | Status: DC | PRN
  Administered 2015-09-26: 650 mg via ORAL
  Filled 2015-09-23: qty 2

## 2015-09-23 MED ORDER — GLYCOPYRROLATE 0.2 MG/ML IJ SOLN
INTRAMUSCULAR | Status: AC
  Filled 2015-09-23: qty 3

## 2015-09-23 MED ORDER — INSULIN ASPART 100 UNIT/ML ~~LOC~~ SOLN
0.0000 [IU] | Freq: Three times a day (TID) | SUBCUTANEOUS | Status: DC
  Administered 2015-09-25: 2 [IU] via SUBCUTANEOUS
  Administered 2015-09-26: 5 [IU] via SUBCUTANEOUS

## 2015-09-23 MED ORDER — LACTATED RINGERS IV SOLN: INTRAVENOUS | Status: DC

## 2015-09-23 MED ORDER — NEOSTIGMINE METHYLSULFATE 10 MG/10ML IV SOLN
INTRAVENOUS | Status: AC
  Filled 2015-09-23: qty 1

## 2015-09-23 MED ORDER — THROMBIN 5000 UNITS EX SOLR
CUTANEOUS | Status: DC | PRN
Start: 2015-09-23 — End: 2015-09-23
  Administered 2015-09-23 (×2): 5000 [IU] via TOPICAL

## 2015-09-23 MED ORDER — DEXTROSE 50 % IV SOLN
INTRAVENOUS | Status: AC
  Administered 2015-09-23: .5 via INTRAVENOUS
  Filled 2015-09-23: qty 50

## 2015-09-23 MED ORDER — ONDANSETRON HCL 4 MG/2ML IJ SOLN
4.0000 mg | INTRAMUSCULAR | Status: DC | PRN
  Administered 2015-09-27: 4 mg via INTRAVENOUS
  Filled 2015-09-23: qty 2

## 2015-09-23 MED ORDER — HYDROMORPHONE HCL 1 MG/ML IJ SOLN
0.2500 mg | INTRAMUSCULAR | Status: DC | PRN
  Administered 2015-09-23 (×3): 0.5 mg via INTRAVENOUS

## 2015-09-23 MED ORDER — ROCURONIUM BROMIDE 100 MG/10ML IV SOLN
INTRAVENOUS | Status: DC | PRN
  Administered 2015-09-23: 10 mg via INTRAVENOUS
  Administered 2015-09-23: 40 mg via INTRAVENOUS

## 2015-09-23 MED ORDER — METHOCARBAMOL 1000 MG/10ML IJ SOLN
500.0000 mg | Freq: Four times a day (QID) | INTRAVENOUS | Status: DC | PRN
  Administered 2015-09-26: 500 mg via INTRAVENOUS
  Filled 2015-09-23 (×2): qty 5

## 2015-09-23 MED ORDER — BACITRACIN 50000 UNITS IM SOLR
INTRAMUSCULAR | Status: DC | PRN
  Administered 2015-09-23: 14:00:00

## 2015-09-23 MED ORDER — LIDOCAINE HCL (CARDIAC) 20 MG/ML IV SOLN
INTRAVENOUS | Status: DC | PRN
  Administered 2015-09-23: 60 mg via INTRAVENOUS

## 2015-09-23 MED ORDER — MENTHOL 3 MG MT LOZG: 1.0000 | LOZENGE | OROMUCOSAL | Status: DC | PRN

## 2015-09-23 MED ORDER — ONDANSETRON HCL 4 MG/2ML IJ SOLN
INTRAMUSCULAR | Status: DC | PRN
  Administered 2015-09-23: 4 mg via INTRAVENOUS

## 2015-09-23 MED ORDER — CEFAZOLIN SODIUM-DEXTROSE 2-3 GM-% IV SOLR
2.0000 g | Freq: Three times a day (TID) | INTRAVENOUS | Status: AC
  Administered 2015-09-23 – 2015-09-24 (×2): 2 g via INTRAVENOUS
  Filled 2015-09-23 (×2): qty 50

## 2015-09-23 MED ORDER — PHENYLEPHRINE 40 MCG/ML (10ML) SYRINGE FOR IV PUSH (FOR BLOOD PRESSURE SUPPORT)
PREFILLED_SYRINGE | INTRAVENOUS | Status: AC
  Filled 2015-09-23: qty 10

## 2015-09-23 MED ORDER — BISACODYL 5 MG PO TBEC: 5.0000 mg | DELAYED_RELEASE_TABLET | Freq: Every day | ORAL | Status: DC | PRN

## 2015-09-23 MED ORDER — GLYCOPYRROLATE 0.2 MG/ML IJ SOLN
INTRAMUSCULAR | Status: AC
  Filled 2015-09-23: qty 2

## 2015-09-23 MED ORDER — POTASSIUM CHLORIDE IN NACL 20-0.45 MEQ/L-% IV SOLN
INTRAVENOUS | Status: DC
  Administered 2015-09-27: 75 mL/h via INTRAVENOUS
  Filled 2015-09-23 (×11): qty 1000

## 2015-09-23 MED ORDER — HYDROMORPHONE HCL 1 MG/ML IJ SOLN
1.0000 mg | INTRAMUSCULAR | Status: DC | PRN
  Administered 2015-09-27: 1.5 mg via INTRAMUSCULAR
  Filled 2015-09-23: qty 2

## 2015-09-23 MED ORDER — PANTOPRAZOLE SODIUM 40 MG IV SOLR: 40.0000 mg | Freq: Every day | INTRAVENOUS | Status: DC

## 2015-09-23 MED ORDER — SODIUM CHLORIDE 0.9 % IJ SOLN: 3.0000 mL | INTRAMUSCULAR | Status: DC | PRN

## 2015-09-23 MED ORDER — DOCUSATE SODIUM 100 MG PO CAPS
100.0000 mg | ORAL_CAPSULE | Freq: Two times a day (BID) | ORAL | Status: DC
  Administered 2015-09-23 – 2015-09-28 (×9): 100 mg via ORAL
  Filled 2015-09-23 (×10): qty 1

## 2015-09-23 MED ORDER — PROPOFOL 10 MG/ML IV BOLUS
INTRAVENOUS | Status: AC
  Filled 2015-09-23: qty 20

## 2015-09-23 MED ORDER — PROPOFOL 10 MG/ML IV BOLUS
INTRAVENOUS | Status: DC | PRN
  Administered 2015-09-23: 120 mg via INTRAVENOUS
  Administered 2015-09-23: 20 mg via INTRAVENOUS

## 2015-09-23 SURGICAL SUPPLY — 50 items
BAG DECANTER FOR FLEXI CONT (MISCELLANEOUS) ×3 IMPLANT
BENZOIN TINCTURE PRP APPL 2/3 (GAUZE/BANDAGES/DRESSINGS) ×3 IMPLANT
BLADE CLIPPER SURG (BLADE) ×3 IMPLANT
BRUSH SCRUB EZ PLAIN DRY (MISCELLANEOUS) ×3 IMPLANT
BUR CUTTER 7.0 ROUND (BURR) ×6 IMPLANT
BUR MATCHSTICK NEURO 3.0 LAGG (BURR) IMPLANT
CANISTER SUCT 3000ML PPV (MISCELLANEOUS) ×3 IMPLANT
CLOSURE WOUND 1/2 X4 (GAUZE/BANDAGES/DRESSINGS) ×1
DERMABOND ADVANCED (GAUZE/BANDAGES/DRESSINGS) ×2
DERMABOND ADVANCED .7 DNX12 (GAUZE/BANDAGES/DRESSINGS) ×1 IMPLANT
DEVICE COFLEX STABLIZATION 8MM (Neuro Prosthesis/Implant) ×3 IMPLANT
DRAPE C-ARM 42X72 X-RAY (DRAPES) ×6 IMPLANT
DRAPE LAPAROTOMY 100X72X124 (DRAPES) ×3 IMPLANT
DRAPE SURG 17X23 STRL (DRAPES) ×6 IMPLANT
DRSG OPSITE POSTOP 4X6 (GAUZE/BANDAGES/DRESSINGS) ×3 IMPLANT
DURAPREP 26ML APPLICATOR (WOUND CARE) ×3 IMPLANT
ELECT REM PT RETURN 9FT ADLT (ELECTROSURGICAL) ×3
ELECTRODE REM PT RTRN 9FT ADLT (ELECTROSURGICAL) ×1 IMPLANT
GAUZE SPONGE 4X4 12PLY STRL (GAUZE/BANDAGES/DRESSINGS) ×3 IMPLANT
GAUZE SPONGE 4X4 16PLY XRAY LF (GAUZE/BANDAGES/DRESSINGS) ×6 IMPLANT
GLOVE BIO SURGEON STRL SZ 6.5 (GLOVE) ×2 IMPLANT
GLOVE BIO SURGEONS STRL SZ 6.5 (GLOVE) ×1
GLOVE ECLIPSE 8.0 STRL XLNG CF (GLOVE) ×3 IMPLANT
GLOVE INDICATOR 6.5 STRL GRN (GLOVE) ×6 IMPLANT
GLOVE INDICATOR 7.0 STRL GRN (GLOVE) ×3 IMPLANT
GLOVE INDICATOR 7.5 STRL GRN (GLOVE) ×3 IMPLANT
GLOVE SURG SS PI 6.5 STRL IVOR (GLOVE) ×3 IMPLANT
GOWN STRL REUS W/ TWL LRG LVL3 (GOWN DISPOSABLE) IMPLANT
GOWN STRL REUS W/ TWL XL LVL3 (GOWN DISPOSABLE) ×2 IMPLANT
GOWN STRL REUS W/TWL 2XL LVL3 (GOWN DISPOSABLE) IMPLANT
GOWN STRL REUS W/TWL LRG LVL3 (GOWN DISPOSABLE)
GOWN STRL REUS W/TWL XL LVL3 (GOWN DISPOSABLE) ×4
KIT BASIN OR (CUSTOM PROCEDURE TRAY) ×3 IMPLANT
KIT ROOM TURNOVER OR (KITS) ×3 IMPLANT
LIQUID BAND (GAUZE/BANDAGES/DRESSINGS) ×3 IMPLANT
NEEDLE HYPO 22GX1.5 SAFETY (NEEDLE) ×3 IMPLANT
NEEDLE SPNL 22GX3.5 QUINCKE BK (NEEDLE) ×6 IMPLANT
NS IRRIG 1000ML POUR BTL (IV SOLUTION) ×3 IMPLANT
PACK LAMINECTOMY NEURO (CUSTOM PROCEDURE TRAY) ×3 IMPLANT
PAD ARMBOARD 7.5X6 YLW CONV (MISCELLANEOUS) ×9 IMPLANT
PATTIES SURGICAL .75X.75 (GAUZE/BANDAGES/DRESSINGS) ×3 IMPLANT
RUBBERBAND STERILE (MISCELLANEOUS) ×3 IMPLANT
SPONGE SURGIFOAM ABS GEL SZ50 (HEMOSTASIS) ×3 IMPLANT
STRIP CLOSURE SKIN 1/2X4 (GAUZE/BANDAGES/DRESSINGS) ×2 IMPLANT
SUT PROLENE 0 CT 1 30 (SUTURE) IMPLANT
SUT VIC AB 2-0 OS6 18 (SUTURE) ×9 IMPLANT
SUT VIC AB 3-0 CP2 18 (SUTURE) ×3 IMPLANT
TOWEL OR 17X24 6PK STRL BLUE (TOWEL DISPOSABLE) ×3 IMPLANT
TOWEL OR 17X26 10 PK STRL BLUE (TOWEL DISPOSABLE) ×3 IMPLANT
WATER STERILE IRR 1000ML POUR (IV SOLUTION) ×3 IMPLANT

## 2015-09-23 NOTE — Anesthesia Procedure Notes (Signed)
Procedure Name: Intubation Date/Time: 09/23/2015 2:23 PM Performed by: Mariea Clonts Pre-anesthesia Checklist: Patient identified, Timeout performed, Emergency Drugs available, Suction available and Patient being monitored Patient Re-evaluated:Patient Re-evaluated prior to inductionOxygen Delivery Method: Circle system utilized Preoxygenation: Pre-oxygenation with 100% oxygen Intubation Type: IV induction Ventilation: Mask ventilation without difficulty Laryngoscope Size: Miller and 2 Grade View: Grade I Tube type: Oral Tube size: 7.0 mm Number of attempts: 1 Placement Confirmation: ETT inserted through vocal cords under direct vision,  breath sounds checked- equal and bilateral and positive ETCO2 Tube secured with: Tape Dental Injury: Teeth and Oropharynx as per pre-operative assessment

## 2015-09-23 NOTE — H&P (Signed)
Ruth Rhodes is an 82 y.o. female.   Chief Complaint: Back pain into the legs HPI: The patient is a 82 year old female who is evaluated in the office for back pain with radiation to the legs which is fairly equal bilaterally. She's had a spinal for a number of months. She has seen a pain specialist who gave her an epidural shot without relief. She's tried gabapentin Robaxin Ultram without help. Walking increases the pain. She says that she's been getting steadily worse. The patient underwent an MRI scan of the cervical region because of some weakness of the left shoulder but was not particularly severe. We will back review her MRI scan which showed severe stenosis at L4-5 with a disc herniation L5-S1 on the left. The options were discussed and was elected to decompress L4-5 bilaterally and placing Coflex intralaminar device and then do a discectomy at L5-S1 on the left. The patient agrees with the plan. I've had a long discussion with her regarding the risks and benefits of surgical intervention. The risks discussed include but are not limited to bleeding infection weakness numbness paralysis spinal fluid leak trouble instrumentation coma and death. We have discussed alternative methods of therapy along with risks and benefits of nonintervention. She's had the opportunity numerous questions and appears to understand. With this information in hand she has requested we proceed with surgery.  Past Medical History  Diagnosis Date  . Hypertension   . Arthritis   . Diabetes mellitus without complication ([place])   . Back pain     Past Surgical History  Procedure Laterality Date  . Abdominal hysterectomy      History reviewed. No pertinent family history. Social History:  reports that she has quit smoking. She has never used smokeless tobacco. She reports that she does not drink alcohol or use illicit drugs.  Allergies:  Allergies  Allergen Reactions  . Tramadol Nausea Only and Nausea And Vomiting     Medications Prior to Admission  Medication Sig Dispense Refill  . BYSTOLIC 20 MG TABS Take 20 mg by mouth daily.     Marland Kitchen gabapentin (NEURONTIN) 400 MG capsule Take 400 mg by mouth 3 (three) times daily.    Marland Kitchen GLYXAMBI 25-5 MG TABS Take 1 tablet by mouth daily.     Marland Kitchen lisinopril (PRINIVIL,ZESTRIL) 20 MG tablet Take 20 mg by mouth daily.     Marland Kitchen oxyCODONE-acetaminophen (ROXICET) 5-325 MG per tablet Take 1 tablet by mouth every 4 (four) hours as needed for severe pain. 15 tablet 0  . methocarbamol (ROBAXIN) 500 MG tablet Take 500 mg by mouth 2 (two) times daily.    . naproxen (NAPROSYN) 500 MG tablet Take 1 tablet (500 mg total) by mouth 2 (two) times daily with a meal. 10 tablet 0    Results for orders placed or performed during the hospital encounter of 09/23/15 (from the past 48 hour(s))  Glucose, capillary     Status: Abnormal   Collection Time: 09/23/15  9:14 AM  Result Value Ref Range   Glucose-Capillary 121 (H) 65 - 99 mg/dL  Glucose, capillary     Status: Abnormal   Collection Time: 09/23/15 11:39 AM  Result Value Ref Range   Glucose-Capillary 103 (H) 65 - 99 mg/dL  Glucose, capillary     Status: None   Collection Time: 09/23/15  1:27 PM  Result Value Ref Range   Glucose-Capillary 85 65 - 99 mg/dL   No results found.  Unremarkable  Blood pressure 183/62, pulse 58, temperature  97.8 F (36.6 C), temperature source Oral, resp. rate 18, height '5\' 2"'$  (1.575 m), weight 61.644 kg (135 lb 14.4 oz), SpO2 99 %.  The patient is awake alert and oriented. She is no facial asymmetry. Her gait is mildly antalgic. Reflexes are decreased diffusely. Her lower extremity strength is intact but she does have some weakness of the left deltoid muscle. Assessment/Plan Impression is that of spinal stenosis at L4-5 with a disc herniation at L5-S1. The plan is for decompression L4-5 and discectomy at L5-S1.  Faythe Ghee, MD 09/23/2015, 2:04 PM

## 2015-09-23 NOTE — Anesthesia Postprocedure Evaluation (Signed)
Anesthesia Post Note  Patient: Ruth Rhodes  Procedure(s) Performed: Procedure(s) (LRB): Laminectomy and Foraminotomy L4-L5 bilateral with CoFlex L5-S1 - left (Bilateral)  Patient location during evaluation: PACU Anesthesia Type: General Level of consciousness: awake and alert, awake and oriented Pain management: pain level controlled Vital Signs Assessment: post-procedure vital signs reviewed and stable Respiratory status: spontaneous breathing, nonlabored ventilation, respiratory function stable and patient connected to nasal cannula oxygen Cardiovascular status: blood pressure returned to baseline and stable Postop Assessment: no signs of nausea or vomiting Anesthetic complications: no    Last Vitals:  Filed Vitals:   09/23/15 1800 09/23/15 1808  BP: 172/81   Pulse: 74 76  Temp:  36.6 C  Resp: 11 13    Last Pain:  Filed Vitals:   09/23/15 1809  PainSc: [place] Edward Taye Cato

## 2015-09-23 NOTE — Anesthesia Preprocedure Evaluation (Addendum)
Anesthesia Evaluation  Patient identified by MRN, date of birth, ID band Patient awake    Reviewed: Allergy & Precautions, H&P , NPO status , Patient's Chart, lab work & pertinent test results, reviewed documented beta blocker date and time   Airway Mallampati: II  TM Distance: >3 FB Neck ROM: full    Dental  (+) Dental Advisory Given, Edentulous Upper, Edentulous Lower   Pulmonary neg pulmonary ROS, former smoker,    Pulmonary exam normal breath sounds clear to auscultation       Cardiovascular hypertension, Pt. on medications and Pt. on home beta blockers Normal cardiovascular exam Rhythm:regular Rate:Normal  Normal echo 2015. Has cardiac clearance.   Neuro/Psych negative neurological ROS  negative psych ROS   GI/Hepatic negative GI ROS, Neg liver ROS,   Endo/Other  diabetes, Well Controlled, Type 2Diet controlled DM  Renal/GU negative Renal ROS  negative genitourinary   Musculoskeletal   Abdominal   Peds  Hematology negative hematology ROS (+)   Anesthesia Other Findings   Reproductive/Obstetrics negative OB ROS                         Anesthesia Physical Anesthesia Plan  ASA: III  Anesthesia Plan: General   Post-op Pain Management:    Induction: Intravenous  Airway Management Planned: Oral ETT  Additional Equipment:   Intra-op Plan:   Post-operative Plan: Extubation in OR  Informed Consent: I have reviewed the patients History and Physical, chart, labs and discussed the procedure including the risks, benefits and alternatives for the proposed anesthesia with the patient or authorized representative who has indicated his/her understanding and acceptance.   Dental Advisory Given  Plan Discussed with: CRNA and Surgeon  Anesthesia Plan Comments:         Anesthesia Quick Evaluation

## 2015-09-23 NOTE — Op Note (Signed)
Preop diagnosis: Spinal stenosis L4-5 with grade 1 spondylolisthesis Herniated disc L5-S1 left Postop diagnosis: Same Procedure: Bilateral L4-5 decompressive laminotomy for relief of central and lateral recess stenosis Left L5-S1 microdiscectomy L4-5 intralaminar stabilization with Coflex intralaminar dynamic device Surgeon: Davin Muramoto Asst.: Jones  After and placed the prone position the patient's back was prepped and draped in the usual sterile fashion. Localizing x-rays taken prior to incision to identify the appropriate levels. Midline incision was made above the spinous processes of L4-L5 and S1. Using Bovie cutting current the incision was carried on the spinous processes. Suppressed dissection was then carried out on the left side of the spinous processes and lamina and subcutaneous tract was placed for exposure. X-ray showed approach to the L5-S1 level and L4-5 as well. At L5-S1 we did a laminotomy by removing the inferior one third of the elbow 5 lamina the medial one third of the facet joint the superior one third of the S1 lamina. Residual bone and ligamentum flavum removed in a piecemeal fashion. The microscope was draped brought in the field and used for the remainder of the case at this level. We identified the lateral aspect of the thecal sac and S1 nerve root. Further coagulation was carried out down before the canal to identify the L5-S1 disc which is not remarkably herniated. We incised the disc with a 15 blade and thoroughly cleaned out with pituitary rongeurs and curettes. We inspected all directions for any evidence of residual compression and none could be identified. We then turned our attention to L4-5. We did a subperiosteal still dissection along the right side of the spinous processes and lamina placed bilateral retractors at that level. We identified the intralaminar spinous space removed soft tissue within the spinous processes. We then did generous foraminotomies bilaterally by  removing the inferior one half of the L4 lamina the medial third of the facet joint the superior edge of the L5 lamina. Residual bone and markedly hypertrophic ligamentum flavum was removed. We tracked the L5 nerve root out its foramen and resolved the lateral recess stenosis as well as the medial stenosis. We irrigated once more in all directions for any evidence of residual compression and none could be identified. Any bleeding was controlled proper coagulation Gelfoam. We measured the intra-laminar space and found that an 8 mm Coflex was a good choice. We splayed the wings and then placed the device without difficulty. We then impacted the device without difficulty and crimped the wings after was confirmed to be in good location. We irrigated once more controlled any bleeders proper coagulation Gelfoam. We left a drain in the space around the Coflex device is recommended. We then closed the wound in multiple layers of Vicryl on the fascia subcutaneous and subcuticular tissues. Dermabond and Steri-Strips were placed on the skin. A sterile dressing was then applied and the patient was extubated and taken to recovery room in stable condition.

## 2015-09-23 NOTE — Transfer of Care (Signed)
Immediate Anesthesia Transfer of Care Note  Patient: Ruth Rhodes  Procedure(s) Performed: Procedure(s): Laminectomy and Foraminotomy L4-L5 bilateral with CoFlex L5-S1 - left (Bilateral)  Patient Location: PACU  Anesthesia Type:General  Level of Consciousness: awake, alert , patient cooperative and responds to stimulation  Airway & Oxygen Therapy: Patient Spontanous Breathing and Patient connected to nasal cannula oxygen  Post-op Assessment: Report given to RN, Post -op Vital signs reviewed and stable and Patient moving all extremities X 4  Post vital signs: Reviewed and stable  Last Vitals:  Filed Vitals:   09/23/15 0905  BP: 183/62  Pulse: 58  Temp: 36.6 C  Resp: 18    Complications: No apparent anesthesia complications

## 2015-09-24 ENCOUNTER — Encounter (HOSPITAL_COMMUNITY): Payer: Self-pay | Admitting: Neurosurgery

## 2015-09-24 LAB — GLUCOSE, CAPILLARY
GLUCOSE-CAPILLARY: 203 mg/dL — AB (ref 65–99)
GLUCOSE-CAPILLARY: 126 mg/dL — AB (ref 65–99)
Glucose-Capillary: 148 mg/dL — ABNORMAL HIGH (ref 65–99)
Glucose-Capillary: 163 mg/dL — ABNORMAL HIGH (ref 65–99)

## 2015-09-24 NOTE — Progress Notes (Signed)
Patient ID: Ruth Rhodes, female   DOB: 1934/06/02, 82 y.o.   MRN: [id number] Afeb, vss No new neuro issues Slowly increasing activity. Feels that she needs at least til tomorrowm because she lives alne. Will see what happens re the weather.

## 2015-09-24 NOTE — Clinical Social Work Note (Signed)
Clinical Social Work Assessment  Patient Details  Name: Ruth Rhodes MRN: [id number] Date of Birth: 1933-11-21  Date of referral:  09/24/15               Reason for consult:  Facility Placement                Permission sought to share information with:  Chartered certified accountant granted to share information::  Yes, Verbal Permission Granted  Name::     N/A  Agency::  [facility] SNFs  Relationship::     Contact Information:     Housing/Transportation Living arrangements for the past 2 months:  [place] of Information:  Patient Patient Interpreter Needed:  None Criminal Activity/Legal Involvement Pertinent to Current Situation/Hospitalization:  No - Comment as needed Significant Relationships:  Friend Lives with:    Do you feel safe going back to the place where you live?  No Need for family participation in patient care:  No (Coment)  Care giving concerns:  CSW received referral for possible SNF placement at time of discharge. Patient lives alone. CSW met with patient regarding SNF placement at time of discharge. Patient reports being currently unable to care for herself at home given patient's current physical needs. Patient expressed understanding of SNF process and requests a SNF placement at time of discharge. CSW to continue to follow and assist with discharge planning needs.   Social Worker assessment / plan:  CSW spoke with patient and patient's wife concerning possibility of rehab at [facility] before returning home.  Employment status:  Retired Nurse, adult PT Recommendations:  Not assessed at this time [place] / Referral to community resources:  [place]  Patient/Family's Response to care:  Patient requests rehab before returning home and is agreeable to a SNF in [place]. Patient reported preference for [facility] in [place].  Patient/Family's Understanding of and Emotional  Response to Diagnosis, Current Treatment, and Prognosis:  No questions or concerns.  Emotional Assessment Appearance:  Appears stated age Attitude/Demeanor/Rapport:   (Appropriate) Affect (typically observed):  Accepting, Appropriate Orientation:  Oriented to Self, Oriented to Place, Oriented to  Time, Oriented to Situation Alcohol / Substance use:  Not Applicable Psych involvement (Current and /or in the community):  No (Comment)  Discharge Needs  Concerns to be addressed:  Care Coordination Readmission within the last 30 days:  No Current discharge risk:  None Barriers to Discharge:  Continued Medical Work up   [employer], [place] 09/24/2015, 4:03 PM

## 2015-09-24 NOTE — Progress Notes (Signed)
Patient transferred from unit 3C to room 5C02 at this time. Alert and in stable condition. Bed in lowest position and call bell within reach.

## 2015-09-24 NOTE — NC FL2 (Signed)
[place] MEDICAID FL2 LEVEL OF CARE SCREENING TOOL     IDENTIFICATION  Patient Name: Ruth Rhodes Birthdate: [birth year] Sex: female Admission Date (Current Location): 09/23/2015  [facility] and [place] Number:  Engineering geologist and Address:  [facility], [place] [address], [place], [place] [address]      Provider Number: [id number]  Attending Physician Name and Address:  Karie Chimera, MD  Relative Name and Phone Number:  N/A    Current Level of Care: Hospital Recommended Level of Care: [place] Prior Approval Number:    Date Approved/Denied:   PASRR Number: [id number]  Discharge Plan: SNF    Current Diagnoses: Patient Active Problem List   Diagnosis Date Noted  . Lumbar stenosis with neurogenic claudication 09/23/2015    Orientation RESPIRATION BLADDER Height & Weight    Self, Time, Situation, Place  Normal Continent '5\' 2"'$  (157.5 cm) 135 lbs.  BEHAVIORAL SYMPTOMS/MOOD NEUROLOGICAL BOWEL NUTRITION STATUS   (N/A)   Continent  (Please see DC summary)  AMBULATORY STATUS COMMUNICATION OF NEEDS Skin   Supervision Verbally Surgical wounds (Incision on back.)                       Personal Care Assistance Level of Assistance  Bathing, Feeding, Dressing Bathing Assistance: Independent Feeding assistance: Independent Dressing Assistance: Limited assistance     Functional Limitations Info             SPECIAL CARE FACTORS FREQUENCY                       Contractures      Additional Factors Info  Code Status, Allergies, Insulin Sliding Scale Code Status Info: Full Allergies Info: Tramadol   Insulin Sliding Scale Info: 3 times daily       Current Medications (09/24/2015):  This is the current hospital active medication list Current Facility-Administered Medications  Medication Dose Route Frequency Provider Last Rate Last Dose  . 0.45 % NaCl with KCl 20 mEq / L infusion   Intravenous Continuous Karie Chimera, MD      . 0.9 %  sodium chloride infusion  250 mL Intravenous Continuous Karie Chimera, MD      . acetaminophen (TYLENOL) tablet 650 mg  650 mg Oral Q4H PRN Karie Chimera, MD       Or  . acetaminophen (TYLENOL) suppository 650 mg  650 mg Rectal Q4H PRN Karie Chimera, MD      . bisacodyl (DULCOLAX) EC tablet 5 mg  5 mg Oral Daily PRN Karie Chimera, MD      . canagliflozin [facility]) tablet 100 mg  100 mg Oral QAC breakfast Karie Chimera, MD   100 mg at 09/24/15 0618  . docusate sodium (COLACE) capsule 100 mg  100 mg Oral BID Karie Chimera, MD   100 mg at 09/24/15 1023  . gabapentin (NEURONTIN) capsule 400 mg  400 mg Oral TID Karie Chimera, MD   400 mg at 09/24/15 1023  . HYDROmorphone (DILAUDID) injection 1-1.5 mg  1-1.5 mg Intramuscular Q3H PRN Karie Chimera, MD      . insulin aspart (novoLOG) injection 0-15 Units  0-15 Units Subcutaneous TID WC Karie Chimera, MD   0 Units at 09/24/15 0800  . insulin aspart (novoLOG) injection 0-5 Units  0-5 Units Subcutaneous QHS Karie Chimera, MD   3 Units at 09/23/15 2251  . insulin aspart (novoLOG) injection 4 Units  4 Units Subcutaneous TID  WC Karie Chimera, MD   4 Units at 09/24/15 0800  . linagliptin (TRADJENTA) tablet 5 mg  5 mg Oral Daily Karie Chimera, MD   5 mg at 09/24/15 1026  . lisinopril (PRINIVIL,ZESTRIL) tablet 20 mg  20 mg Oral Daily Karie Chimera, MD   20 mg at 09/24/15 1025  . menthol-cetylpyridinium (CEPACOL) lozenge 3 mg  1 lozenge Oral PRN Karie Chimera, MD       Or  . phenol (CHLORASEPTIC) mouth spray 1 spray  1 spray Mouth/Throat PRN Karie Chimera, MD      . methocarbamol (ROBAXIN) tablet 500 mg  500 mg Oral Q6H PRN Karie Chimera, MD   500 mg at 09/24/15 0532   Or  . methocarbamol (ROBAXIN) 500 mg in dextrose 5 % 50 mL IVPB  500 mg Intravenous Q6H PRN Karie Chimera, MD      . nebivolol (BYSTOLIC) tablet 20 mg  20 mg Oral Daily Karie Chimera, MD   20 mg at 09/24/15 1025  . ondansetron (ZOFRAN) injection 4 mg  4 mg Intravenous  Q4H PRN Karie Chimera, MD      . oxyCODONE-acetaminophen (PERCOCET/ROXICET) 5-325 MG per tablet 1-2 tablet  1-2 tablet Oral Q4H PRN Karie Chimera, MD   2 tablet at 09/24/15 1428  . pantoprazole (PROTONIX) EC tablet 40 mg  40 mg Oral Daily Karie Chimera, MD   40 mg at 09/24/15 1023  . sodium chloride 0.9 % injection 3 mL  3 mL Intravenous Q12H Karie Chimera, MD   3 mL at 09/23/15 2203  . sodium chloride 0.9 % injection 3 mL  3 mL Intravenous PRN Karie Chimera, MD         Discharge Medications: Please see discharge summary for a list of discharge medications.  Relevant Imaging Results:  Relevant Lab Results:   Additional Information SSN: [id number]  Benard Halsted, LCSWA

## 2015-09-24 NOTE — Progress Notes (Signed)
Pt transferred to 5C02. Report given to RN at the bedside. Pt is stable @ transfer. Holli Humbles, RN

## 2015-09-25 LAB — GLUCOSE, CAPILLARY
GLUCOSE-CAPILLARY: 122 mg/dL — AB (ref 65–99)
GLUCOSE-CAPILLARY: 114 mg/dL — AB (ref 65–99)
Glucose-Capillary: 96 mg/dL (ref 65–99)
Glucose-Capillary: 177 mg/dL — ABNORMAL HIGH (ref 65–99)

## 2015-09-25 NOTE — Evaluation (Signed)
Physical Therapy Evaluation Patient Details Name: Ruth Rhodes MRN: [id number] DOB: May 28, 1934 Today's Date: 09/25/2015   History of Present Illness  Pt is a 82 y/o female who presents s/p L4-L5 decompressive laminotomy and L5-S1 microdiscectomy on 09/24/15.  Clinical Impression  Pt admitted with above diagnosis. Pt currently with functional limitations due to the deficits listed below (see PT Problem List). At the time of PT eval pt was able to perform transfers and ambulation with assistance for balance and support. Pt reports she has spoken with staff regarding inpatient rehab at d/c. Therapist explained process of being accepted into inpatient rehab. Pt was very independent PTA and feel she will progress well as pain decreases. Pt will benefit from skilled PT to increase their independence and safety with mobility to allow discharge to the venue listed below.       Follow Up Recommendations CIR;Supervision for mobility/OOB    Equipment Recommendations  Rolling walker with 5" wheels;3in1 (PT)    Recommendations for Other Services Rehab consult     Precautions / Restrictions Precautions Precautions: Fall;Back Precaution Booklet Issued: Yes (comment) Precaution Comments: Reviewed handout and pt was educated on back precautions throughout functional mobility.  Required Braces or Orthoses:  (No brace per orders) Restrictions Weight Bearing Restrictions: No      Mobility  Bed Mobility Overal bed mobility: Needs Assistance Bed Mobility: Rolling;Sidelying to Sit Rolling: Min assist Sidelying to sit: Mod assist       General bed mobility comments: Assist to fully roll to the L side, and assist at the trunk to elevate to full sitting position. Increased time for pt to gain/maintain sitting balance.   Transfers Overall transfer level: Needs assistance Equipment used: Rolling walker (2 wheeled) Transfers: Sit to/from Stand Sit to Stand: Mod assist         General transfer  comment: Assist to power-up to full standing position. Increased time required and VC's for maintenance of back precautions.   Ambulation/Gait Ambulation/Gait assistance: Min guard;Min assist Ambulation Distance (Feet): 15 Feet Assistive device: Rolling walker (2 wheeled) Gait Pattern/deviations: Step-through pattern;Decreased stride length;Trunk flexed Gait velocity: Decreased Gait velocity interpretation: Below normal speed for age/gender General Gait Details: Grossly min guard however occasional min assist required for walker maneuvering around room.   Stairs            Wheelchair Mobility    Modified Rankin (Stroke Patients Only)       Balance Overall balance assessment: Needs assistance Sitting-balance support: Feet supported;No upper extremity supported Sitting balance-Leahy Scale: Poor Sitting balance - Comments: Requires UE support and needs cues to return to midline Postural control: Posterior lean;Left lateral lean Standing balance support: Bilateral upper extremity supported;During functional activity Standing balance-Leahy Scale: Poor                               Pertinent Vitals/Pain Pain Assessment: 0-10 Pain Score: 8  Pain Location: back Pain Descriptors / Indicators: Operative site guarding;Discomfort Pain Intervention(s): Limited activity within patient's tolerance;Monitored during session;Repositioned    Home Living Family/patient expects to be discharged to:: Inpatient rehab Living Arrangements: Alone                    Prior Function Level of Independence: Independent               Hand Dominance   Dominant Hand: Right    Extremity/Trunk Assessment   Upper Extremity Assessment: Defer to OT  evaluation           Lower Extremity Assessment: Generalized weakness      Cervical / Trunk Assessment: Kyphotic  Communication   Communication:  (Slow to respond at times.)  Cognition Arousal/Alertness:  Awake/alert Behavior During Therapy: WFL for tasks assessed/performed Overall Cognitive Status: Within Functional Limits for tasks assessed                      General Comments      Exercises        Assessment/Plan    PT Assessment Patient needs continued PT services  PT Diagnosis Difficulty walking;Acute pain   PT Problem List Decreased strength;Decreased range of motion;Decreased activity tolerance;Decreased balance;Decreased mobility;Decreased knowledge of use of DME;Decreased safety awareness;Decreased knowledge of precautions;Pain  PT Treatment Interventions DME instruction;Gait training;Stair training;Functional mobility training;Therapeutic activities;Therapeutic exercise;Neuromuscular re-education;Patient/family education   PT Goals (Current goals can be found in the Care Plan section) Acute Rehab PT Goals Patient Stated Goal: "Go to inpatient rehab before I go home" PT Goal Formulation: With patient Time For Goal Achievement: 10/09/15 Potential to Achieve Goals: Good    Frequency Min 5X/week   Barriers to discharge Decreased caregiver support Pt lives alone    Co-evaluation               End of Session Equipment Utilized During Treatment: Gait belt Activity Tolerance: Patient limited by fatigue;Patient limited by pain Patient left: in chair;with chair alarm set;with call bell/phone within reach Nurse Communication: Mobility status         Time: 5956-3875 PT Time Calculation (min) (ACUTE ONLY): 23 min   Charges:   PT Evaluation $PT Eval Moderate Complexity: 1 Procedure     PT G Codes:        Rolinda Roan 23-Oct-2015, 1:34 PM   Rolinda Roan, PT, DPT Acute Rehabilitation Services Pager: [phone]

## 2015-09-25 NOTE — Progress Notes (Signed)
Rehab Admissions Coordinator Note:  Patient was screened by Cleatrice Burke for appropriateness for an Inpatient Acute Rehab Consult per PT recommendation.   At this time, we are recommending SNF rehab. It is very doubtful that [facility] will approve an inpt rehab admission for pt's current diagnosis.   Cleatrice Burke 09/25/2015, 7:39 PM  I can be reached at [phone].

## 2015-09-25 NOTE — Progress Notes (Signed)
Patient ID: Ruth Rhodes, female   DOB: 1934/08/13, 82 y.o.   MRN: [id number] Patient condition back pain no leg pain  Strength out of 5 wound clean dry and intact  Patient still feels like she is in too much back pain to be discharged home we'll continue to observe and physical therapy.

## 2015-09-26 LAB — GLUCOSE, CAPILLARY
GLUCOSE-CAPILLARY: 201 mg/dL — AB (ref 65–99)
GLUCOSE-CAPILLARY: 92 mg/dL (ref 65–99)
Glucose-Capillary: 124 mg/dL — ABNORMAL HIGH (ref 65–99)
Glucose-Capillary: 165 mg/dL — ABNORMAL HIGH (ref 65–99)

## 2015-09-26 NOTE — Progress Notes (Signed)
Physical Therapy Treatment Patient Details Name: MAKELLE MARRONE MRN: [id number] DOB: 1934-07-09 Today's Date: 09/26/2015    History of Present Illness Pt is a 82 y/o female who presents s/p L4-L5 decompressive laminotomy and L5-S1 microdiscectomy on 09/24/15.    PT Comments    Patient making improvement with gait.  Bed mobility/transfers limited by increased pain, requiring mod assist.  Noted CIR note.  Patient now for SNF at discharge for continued therapy.  Follow Up Recommendations  SNF;Supervision/Assistance - 24 hour     Equipment Recommendations  Rolling walker with 5" wheels;3in1 (PT)    Recommendations for Other Services       Precautions / Restrictions Precautions Precautions: Fall;Back Precaution Comments: Reviewed back precautions Restrictions Weight Bearing Restrictions: No    Mobility  Bed Mobility Overal bed mobility: Needs Assistance Bed Mobility: Rolling;Sidelying to Sit;Sit to Sidelying Rolling: Mod assist (Due to increased pain) Sidelying to sit: Mod assist     Sit to sidelying: Min assist General bed mobility comments: Assist with bed pad to roll to right side - patient with limited movement of Lt shoulder.  Mod assist to raise trunk to upright sitting position.  Required min assist to bring LE's into bed to return to sidelying.  Transfers Overall transfer level: Needs assistance Equipment used: Rolling walker (2 wheeled) Transfers: Sit to/from Stand Sit to Stand: Mod assist         General transfer comment: Assist to power up to stance from bed and chair.  Ambulation/Gait Ambulation/Gait assistance: Min assist Ambulation Distance (Feet): 140 Feet Assistive device: Rolling walker (2 wheeled) Gait Pattern/deviations: Step-through pattern;Decreased step length - right;Decreased step length - left;Decreased stride length Gait velocity: Decreased Gait velocity interpretation: Below normal speed for age/gender General Gait Details: Slow gait  pattern with short step length.  Assist to maneuver RW in turns and around obstacles.  Pain limiting mobility.   Stairs            Wheelchair Mobility    Modified Rankin (Stroke Patients Only)       Balance                                    Cognition Arousal/Alertness: Awake/alert Behavior During Therapy: WFL for tasks assessed/performed Overall Cognitive Status: Within Functional Limits for tasks assessed                      Exercises General Exercises - Lower Extremity Ankle Circles/Pumps: AROM;Both;10 reps;Seated    General Comments        Pertinent Vitals/Pain Pain Assessment: 0-10 Pain Score: 10-Worst pain ever Pain Location: Back Pain Descriptors / Indicators: Aching;Sore Pain Intervention(s): Monitored during session;Repositioned;Patient requesting pain meds-RN notified;RN gave pain meds during session    Home Living                      Prior Function            PT Goals (current goals can now be found in the care plan section) Acute Rehab PT Goals Patient Stated Goal: Go to SNF in [place] Progress towards PT goals: Progressing toward goals    Frequency  Min 5X/week    PT Plan Discharge plan needs to be updated    Co-evaluation             End of Session Equipment Utilized During Treatment: Gait belt Activity Tolerance: Patient limited  by pain;Patient limited by fatigue Patient left: in bed;with call bell/phone within reach     Time: 1025-1055 PT Time Calculation (min) (ACUTE ONLY): 30 min  Charges:  $Gait Training: 23-37 mins                    G Codes:      Despina Pole 10/17/15, 11:24 AM Carita Pian. Sanjuana Kava, [place] Pager [phone]

## 2015-09-26 NOTE — Progress Notes (Signed)
Order to d/c Hemovac drain without success. Oncall MD notified.

## 2015-09-26 NOTE — Progress Notes (Signed)
No acute events AVSS Moving legs well Incision clean, dry, intact Neurologically stable DC drain Hopefully to SNF tomorrow

## 2015-09-27 ENCOUNTER — Inpatient Hospital Stay (HOSPITAL_COMMUNITY): Payer: Medicare Other | Admitting: Certified Registered Nurse Anesthetist

## 2015-09-27 ENCOUNTER — Encounter (HOSPITAL_COMMUNITY)
Admission: RE | Disposition: A | Discharge status: Skilled Nursing Facility | Payer: Self-pay | Source: Ambulatory Visit | Attending: Neurosurgery

## 2015-09-27 HISTORY — PX: WOUND EXPLORATION: SHX6188

## 2015-09-27 LAB — GLUCOSE, CAPILLARY
GLUCOSE-CAPILLARY: 93 mg/dL (ref 65–99)
Glucose-Capillary: 119 mg/dL — ABNORMAL HIGH (ref 65–99)
Glucose-Capillary: 120 mg/dL — ABNORMAL HIGH (ref 65–99)
Glucose-Capillary: 184 mg/dL — ABNORMAL HIGH (ref 65–99)
Glucose-Capillary: 146 mg/dL — ABNORMAL HIGH (ref 65–99)

## 2015-09-27 LAB — BASIC METABOLIC PANEL
ANION GAP: 10 (ref 5–15)
BUN: 12 mg/dL (ref 6–20)
CHLORIDE: 101 mmol/L (ref 101–111)
CO2: 27 mmol/L (ref 22–32)
Calcium: 9.1 mg/dL (ref 8.9–10.3)
Creatinine, Ser: 0.84 mg/dL (ref 0.44–1.00)
GFR calc non Af Amer: >60 mL/min (ref >60)
GLUCOSE: 138 mg/dL — AB (ref 65–99)
Potassium: 3.2 mmol/L — ABNORMAL LOW (ref 3.5–5.1)
Sodium: 138 mmol/L (ref 135–145)

## 2015-09-27 SURGERY — WOUND EXPLORATION
Anesthesia: General | Site: Back

## 2015-09-27 MED ORDER — ARTIFICIAL TEARS OP OINT
TOPICAL_OINTMENT | OPHTHALMIC | Status: DC | PRN
  Administered 2015-09-27: 1 via OPHTHALMIC

## 2015-09-27 MED ORDER — SUCCINYLCHOLINE CHLORIDE 20 MG/ML IJ SOLN
INTRAMUSCULAR | Status: AC
  Filled 2015-09-27: qty 1

## 2015-09-27 MED ORDER — ONDANSETRON HCL 4 MG/2ML IJ SOLN
INTRAMUSCULAR | Status: DC | PRN
  Administered 2015-09-27: 4 mg via INTRAVENOUS

## 2015-09-27 MED ORDER — BUPIVACAINE LIPOSOME 1.3 % IJ SUSP
INTRAMUSCULAR | Status: DC | PRN
  Administered 2015-09-27: 20 mL

## 2015-09-27 MED ORDER — CEFAZOLIN SODIUM-DEXTROSE 2-3 GM-% IV SOLR
INTRAVENOUS | Status: DC | PRN
  Administered 2015-09-27: 2 g via INTRAVENOUS

## 2015-09-27 MED ORDER — FENTANYL CITRATE (PF) 100 MCG/2ML IJ SOLN
INTRAMUSCULAR | Status: DC | PRN
  Administered 2015-09-27: 100 ug via INTRAVENOUS
  Administered 2015-09-27: 50 ug via INTRAVENOUS

## 2015-09-27 MED ORDER — LACTATED RINGERS IV SOLN
INTRAVENOUS | Status: DC | PRN
  Administered 2015-09-27: 09:00:00 via INTRAVENOUS

## 2015-09-27 MED ORDER — DEXAMETHASONE SODIUM PHOSPHATE 10 MG/ML IJ SOLN
INTRAMUSCULAR | Status: AC
  Filled 2015-09-27: qty 2

## 2015-09-27 MED ORDER — FENTANYL CITRATE (PF) 250 MCG/5ML IJ SOLN
INTRAMUSCULAR | Status: AC
  Filled 2015-09-27: qty 5

## 2015-09-27 MED ORDER — LIDOCAINE HCL (CARDIAC) 20 MG/ML IV SOLN
INTRAVENOUS | Status: AC
  Filled 2015-09-27: qty 5

## 2015-09-27 MED ORDER — PROPOFOL 10 MG/ML IV BOLUS
INTRAVENOUS | Status: DC | PRN
  Administered 2015-09-27: 100 mg via INTRAVENOUS

## 2015-09-27 MED ORDER — DEXAMETHASONE SODIUM PHOSPHATE 10 MG/ML IJ SOLN
INTRAMUSCULAR | Status: DC | PRN
  Administered 2015-09-27: 10 mg via INTRAVENOUS

## 2015-09-27 MED ORDER — BUPIVACAINE LIPOSOME 1.3 % IJ SUSP
20.0000 mL | INTRAMUSCULAR | Status: AC
  Filled 2015-09-27: qty 20

## 2015-09-27 MED ORDER — VANCOMYCIN HCL 1000 MG IV SOLR
INTRAVENOUS | Status: DC | PRN
  Administered 2015-09-27: 1000 mg

## 2015-09-27 MED ORDER — ROCURONIUM BROMIDE 50 MG/5ML IV SOLN
INTRAVENOUS | Status: AC
Start: 2015-09-27 — End: 2015-09-27
  Filled 2015-09-27: qty 2

## 2015-09-27 MED ORDER — PHENYLEPHRINE 40 MCG/ML (10ML) SYRINGE FOR IV PUSH (FOR BLOOD PRESSURE SUPPORT)
PREFILLED_SYRINGE | INTRAVENOUS | Status: AC
  Filled 2015-09-27: qty 10

## 2015-09-27 MED ORDER — SUCCINYLCHOLINE CHLORIDE 20 MG/ML IJ SOLN
INTRAMUSCULAR | Status: DC | PRN
  Administered 2015-09-27: 100 mg via INTRAVENOUS

## 2015-09-27 MED ORDER — CEFAZOLIN SODIUM-DEXTROSE 2-3 GM-% IV SOLR
INTRAVENOUS | Status: AC
  Filled 2015-09-27: qty 50

## 2015-09-27 MED ORDER — THROMBIN 5000 UNITS EX SOLR
CUTANEOUS | Status: DC | PRN
  Administered 2015-09-27 (×2): 5000 [IU] via TOPICAL

## 2015-09-27 MED ORDER — BACITRACIN 50000 UNITS IM SOLR
INTRAMUSCULAR | Status: DC | PRN
  Administered 2015-09-27: 500 mL

## 2015-09-27 MED ORDER — VANCOMYCIN HCL 1000 MG IV SOLR
INTRAVENOUS | Status: AC
  Filled 2015-09-27: qty 1000

## 2015-09-27 MED ORDER — 0.9 % SODIUM CHLORIDE (POUR BTL) OPTIME
TOPICAL | Status: DC | PRN
  Administered 2015-09-27: 1000 mL

## 2015-09-27 MED ORDER — EPHEDRINE SULFATE 50 MG/ML IJ SOLN
INTRAMUSCULAR | Status: AC
  Filled 2015-09-27: qty 1

## 2015-09-27 MED ORDER — EPHEDRINE SULFATE 50 MG/ML IJ SOLN
INTRAMUSCULAR | Status: DC | PRN
  Administered 2015-09-27: 20 mg via INTRAVENOUS

## 2015-09-27 MED ORDER — HEMOSTATIC AGENTS (NO CHARGE) OPTIME
TOPICAL | Status: DC | PRN
  Administered 2015-09-27: 1 via TOPICAL

## 2015-09-27 MED ORDER — PHENYLEPHRINE HCL 10 MG/ML IJ SOLN
INTRAMUSCULAR | Status: DC | PRN
  Administered 2015-09-27: 120 ug via INTRAVENOUS

## 2015-09-27 MED ORDER — LIDOCAINE HCL (CARDIAC) 20 MG/ML IV SOLN
INTRAVENOUS | Status: DC | PRN
  Administered 2015-09-27: 60 mg via INTRAVENOUS

## 2015-09-27 SURGICAL SUPPLY — 54 items
BENZOIN TINCTURE PRP APPL 2/3 (GAUZE/BANDAGES/DRESSINGS) IMPLANT
BIT DRILL NEURO 2X3.1 SFT TUCH (MISCELLANEOUS) IMPLANT
BLADE CLIPPER SURG (BLADE) IMPLANT
BLADE SURG 11 STRL SS (BLADE) IMPLANT
BUR ROUND FLUTED 5 RND (BURR) IMPLANT
CANISTER SUCT 3000ML PPV (MISCELLANEOUS) ×2 IMPLANT
CHLORAPREP W/TINT 26ML (MISCELLANEOUS) ×2 IMPLANT
DECANTER SPIKE VIAL GLASS SM (MISCELLANEOUS) ×2 IMPLANT
DERMABOND ADHESIVE PROPEN (GAUZE/BANDAGES/DRESSINGS) ×1
DERMABOND ADVANCED (GAUZE/BANDAGES/DRESSINGS)
DERMABOND ADVANCED .7 DNX12 (GAUZE/BANDAGES/DRESSINGS) IMPLANT
DERMABOND ADVANCED .7 DNX6 (GAUZE/BANDAGES/DRESSINGS) ×1 IMPLANT
DRAPE MICROSCOPE LEICA (MISCELLANEOUS) IMPLANT
DRAPE POUCH INSTRU U-SHP 10X18 (DRAPES) ×2 IMPLANT
DRAPE SURG 17X23 STRL (DRAPES) IMPLANT
DRILL NEURO 2X3.1 SOFT TOUCH (MISCELLANEOUS)
DRSG OPSITE POSTOP 4X6 (GAUZE/BANDAGES/DRESSINGS) ×2 IMPLANT
ELECT REM PT RETURN 9FT ADLT (ELECTROSURGICAL) ×2
ELECTRODE REM PT RTRN 9FT ADLT (ELECTROSURGICAL) ×1 IMPLANT
GAUZE SPONGE 4X4 12PLY STRL (GAUZE/BANDAGES/DRESSINGS) IMPLANT
GAUZE SPONGE 4X4 16PLY XRAY LF (GAUZE/BANDAGES/DRESSINGS) IMPLANT
GLOVE BIOGEL PI IND STRL 7.5 (GLOVE) ×1 IMPLANT
GLOVE BIOGEL PI INDICATOR 7.5 (GLOVE) ×1
GLOVE EXAM NITRILE LRG STRL (GLOVE) IMPLANT
GLOVE EXAM NITRILE MD LF STRL (GLOVE) IMPLANT
GLOVE EXAM NITRILE XL STR (GLOVE) IMPLANT
GLOVE EXAM NITRILE XS STR PU (GLOVE) IMPLANT
GLOVE SS BIOGEL STRL SZ 7 (GLOVE) ×2 IMPLANT
GLOVE SUPERSENSE BIOGEL SZ 7 (GLOVE) ×2
GOWN STRL REUS W/ TWL LRG LVL3 (GOWN DISPOSABLE) ×3 IMPLANT
GOWN STRL REUS W/ TWL XL LVL3 (GOWN DISPOSABLE) IMPLANT
GOWN STRL REUS W/TWL LRG LVL3 (GOWN DISPOSABLE) ×3
GOWN STRL REUS W/TWL XL LVL3 (GOWN DISPOSABLE)
HEMOSTAT POWDER KIT SURGIFOAM (HEMOSTASIS) IMPLANT
KIT BASIN OR (CUSTOM PROCEDURE TRAY) ×2 IMPLANT
KIT ROOM TURNOVER OR (KITS) ×2 IMPLANT
NEEDLE HYPO 25X1 1.5 SAFETY (NEEDLE) ×2 IMPLANT
NS IRRIG 1000ML POUR BTL (IV SOLUTION) ×2 IMPLANT
PACK LAMINECTOMY NEURO (CUSTOM PROCEDURE TRAY) IMPLANT
PACK SURGICAL SETUP 50X90 (CUSTOM PROCEDURE TRAY) ×2 IMPLANT
PACK UNIVERSAL I (CUSTOM PROCEDURE TRAY) ×2 IMPLANT
PAD ARMBOARD 7.5X6 YLW CONV (MISCELLANEOUS) ×6 IMPLANT
PATTIES SURGICAL .5X1.5 (GAUZE/BANDAGES/DRESSINGS) IMPLANT
RUBBERBAND STERILE (MISCELLANEOUS) IMPLANT
SPONGE SURGIFOAM ABS GEL SZ50 (HEMOSTASIS) ×2 IMPLANT
STRIP CLOSURE SKIN 1/2X4 (GAUZE/BANDAGES/DRESSINGS) IMPLANT
SUT VIC AB 0 CT1 18XCR BRD8 (SUTURE) ×1 IMPLANT
SUT VIC AB 0 CT1 8-18 (SUTURE) ×1
SUT VIC AB 2-0 CT1 18 (SUTURE) ×2 IMPLANT
SUT VIC AB 3-0 SH 8-18 (SUTURE) ×2 IMPLANT
TOWEL OR 17X24 6PK STRL BLUE (TOWEL DISPOSABLE) IMPLANT
TOWEL OR 17X26 10 PK STRL BLUE (TOWEL DISPOSABLE) ×2 IMPLANT
TUBE CONNECTING 12X1/4 (SUCTIONS) ×2 IMPLANT
WATER STERILE IRR 1000ML POUR (IV SOLUTION) ×2 IMPLANT

## 2015-09-27 NOTE — Care Management Note (Signed)
Case Management Note  Patient Details  Name: KHAMIYAH GREFE MRN: [id number] Date of Birth: May 15, 1934  Subjective/Objective:   Patient admitted for L4-5 lami/ foramainotomy. Returned to OR today d/t break in hemovac tubing. Patient is from home alone.                Action/Plan: PT recommending SNF. CM will continue to follow for discharge needs.   Expected Discharge Date:                  Expected Discharge Plan:  [place]  In-House Referral:     Discharge planning Services     Post Acute Care Choice:    Choice offered to:     DME Arranged:    DME Agency:     HH Arranged:    [place] Agency:     Status of Service:  In process, will continue to follow  Medicare Important Message Given:    Date Medicare IM Given:    Medicare IM give by:    Date Additional Medicare IM Given:    Additional Medicare Important Message give by:     If discussed at [place] of Stay Meetings, dates discussed:    Additional Comments:  Pollie Friar, RN 09/27/2015, 11:12 AM

## 2015-09-27 NOTE — Clinical Social Work Note (Signed)
Patient has a bed at [facility] once medically stable for discharge. Patient understands she will be admitted into semi-private room and placed on waiting list for private room.   CSW remains available as needed.  Glendon Axe, MSW, [place] [phone] 09/27/2015 2:08 PM

## 2015-09-27 NOTE — Care Management Important Message (Signed)
Important Message  Patient Details  Name: REBBIE LAURICELLA MRN: [id number] Date of Birth: Feb 13, 1934   Medicare Important Message Given:  Yes    Louanne Belton 09/27/2015, 1:29 PMImportant Message  Patient Details  Name: JIANNA DRABIK MRN: [id number] Date of Birth: [birth year]   Medicare Important Message Given:  Yes    Rishon Thilges G 09/27/2015, 1:28 PM

## 2015-09-27 NOTE — Transfer of Care (Signed)
Immediate Anesthesia Transfer of Care Note  Patient: Ruth Rhodes  Procedure(s) Performed: Procedure(s) with comments: WOUND EXPLORATION (N/A) - WOUND EXPLORATION  Patient Location: PACU  Anesthesia Type:General  Level of Consciousness: awake, alert  and patient cooperative  Airway & Oxygen Therapy: Patient Spontanous Breathing and Patient connected to nasal cannula oxygen  Post-op Assessment: Report given to RN, Post -op Vital signs reviewed and stable and Patient moving all extremities  Post vital signs: Reviewed and stable  Last Vitals:  Filed Vitals:   09/27/15 0215 09/27/15 0507  BP: 147/60 162/62  Pulse: 72 66  Temp: 37.2 C 37.1 C  Resp: 20 20    Complications: No apparent anesthesia complications

## 2015-09-27 NOTE — Anesthesia Preprocedure Evaluation (Signed)
Anesthesia Evaluation  Patient identified by MRN, date of birth, ID band Patient awake  General Assessment Comment:PT had a can of Sprite at 0630 this am.   Reviewed: Allergy & Precautions, H&P , NPO status , Patient's Chart, lab work & pertinent test results, reviewed documented beta blocker date and time   History of Anesthesia Complications Negative for: history of anesthetic complications  Airway Mallampati: II  TM Distance: >3 FB Neck ROM: full    Dental  (+) Dental Advisory Given, Edentulous Upper, Edentulous Lower   Pulmonary neg pulmonary ROS, former smoker,    Pulmonary exam normal breath sounds clear to auscultation       Cardiovascular hypertension, Pt. on medications and Pt. on home beta blockers Normal cardiovascular exam Rhythm:regular Rate:Normal  Normal echo 2015. Has cardiac clearance.   Neuro/Psych negative neurological ROS  negative psych ROS   GI/Hepatic negative GI ROS, Neg liver ROS,   Endo/Other  diabetes, Well Controlled, Type 2, Insulin DependentDiet controlled DM  Renal/GU negative Renal ROS  negative genitourinary   Musculoskeletal  (+) Arthritis , Osteoarthritis,    Abdominal   Peds  Hematology negative hematology ROS (+)   Anesthesia Other Findings   Reproductive/Obstetrics negative OB ROS                             BP Readings from Last 3 Encounters:  09/27/15 162/62  09/17/15 173/69  08/16/15 175/78   Lab Results  Component Value Date   WBC 5.0 09/17/2015   HGB 13.0 09/17/2015   HCT 37.5 09/17/2015   MCV 83.0 09/17/2015   PLT 244 09/17/2015     Chemistry      Component Value Date/Time   NA 138 09/27/2015 0643   K 3.2* 09/27/2015 0643   CL 101 09/27/2015 0643   CO2 27 09/27/2015 0643   BUN 12 09/27/2015 0643   CREATININE 0.84 09/27/2015 0643      Component Value Date/Time   CALCIUM 9.1 09/27/2015 0643     Lab Results  Component Value  Date   HGBA1C 8.9* 09/17/2015   Past Surgical History  Procedure Laterality Date  . Abdominal hysterectomy    . Lumbar laminectomy with coflex 2 level Bilateral 09/23/2015    Procedure: Laminectomy and Foraminotomy L4-L5 bilateral with CoFlex L5-S1 - left;  Surgeon: Karie Chimera, MD;  Location: [facility];  Service: Neurosurgery;  Laterality: Bilateral;    Anesthesia Physical  Anesthesia Plan  ASA: III  Anesthesia Plan: General   Post-op Pain Management:    Induction: Intravenous  Airway Management Planned: Oral ETT  Additional Equipment:   Intra-op Plan:   Post-operative Plan: Extubation in OR  Informed Consent: I have reviewed the patients History and Physical, chart, labs and discussed the procedure including the risks, benefits and alternatives for the proposed anesthesia with the patient or authorized representative who has indicated his/her understanding and acceptance.   Dental Advisory Given  Plan Discussed with: CRNA and Surgeon  Anesthesia Plan Comments:         Anesthesia Quick Evaluation

## 2015-09-27 NOTE — Clinical Social Work Note (Signed)
Clinical Social Worker met with patient to present bed offers. Patient has expressed interest in [facility]. CSW contacted [facility] to review referral. Pt requesting private room however understands [facility] currently does not have private rooms available and will be placed on private room waiting list.   CSW remains available as needed.  Glendon Axe, MSW, LCSWA [phone] 09/27/2015 12:21 PM

## 2015-09-27 NOTE — Progress Notes (Signed)
Nurse attempted to remove hemovac drain yesterday and could not Tmax 101.1 VSS Moving legs well Incision c/d/i Drain snapped when I attempted to remove it To OR for wound exploration and drain removal

## 2015-09-27 NOTE — Anesthesia Procedure Notes (Signed)
Procedure Name: Intubation Date/Time: 09/27/2015 9:19 AM Performed by: Layla Maw Pre-anesthesia Checklist: Patient identified, Patient being monitored, Timeout performed, Emergency Drugs available and Suction available Patient Re-evaluated:Patient Re-evaluated prior to inductionOxygen Delivery Method: Circle System Utilized Preoxygenation: Pre-oxygenation with 100% oxygen Intubation Type: IV induction, Rapid sequence and Cricoid Pressure applied Laryngoscope Size: Miller and 3 Grade View: Grade I Tube type: Oral Tube size: 7.5 mm Number of attempts: 1 Airway Equipment and Method: Stylet Placement Confirmation: ETT inserted through vocal cords under direct vision,  positive ETCO2 and breath sounds checked- equal and bilateral Secured at: 21 cm Tube secured with: Tape Dental Injury: Teeth and Oropharynx as per pre-operative assessment

## 2015-09-27 NOTE — Progress Notes (Signed)
Attempted to see pt.  Pt going to OR for wound exploration.  Will attempt back as schedule allows. [name], [place] [phone]

## 2015-09-27 NOTE — Op Note (Signed)
09/23/2015 - 09/27/2015  10:03 AM  PATIENT:  Ruth Rhodes  82 y.o. female  PRE-OPERATIVE DIAGNOSIS:  Retained Hemovac drain  POST-OPERATIVE DIAGNOSIS:  Same  PROCEDURE:  Lumbar wound exploration for drain removal  SURGEON:  Cyndy Freeze, MD  ASSISTANTS: None  ANESTHESIA:   General  DRAINS: None  SPECIMEN: None  INDICATION FOR PROCEDURE: 82 year old female who underwent lumbar decompression with Coflex device last week. Nursing staff attempted to remove her drain yesterday and were unable to. I tried today and the drain snapped with about 9 cm left inside the patient. I explained that this needed to be removed in order to reduce the risk of infection. Patient understood the risks, benefits, and alternatives and potential outcomes and wished to proceed.  PROCEDURE DETAILS: After smooth induction of general endotracheal anesthesia the patient was turned prone on a Wilson frame. The skin was prepped and draped in usual sterile fashion. After timeout was performed I reopened the existing incision by cutting and removing the interrupted Vicryl sutures used to close it. After opening the superior portion of the fascia I encountered the retained Hemovac drain adjacent to the lamina at the superior level on the left. This was removed. I irrigated vigorously with bacitracin saline. I placed a proximally 500 mg of vancomycin powder beneath the fascia. The fascia was then closed in layers with interrupted Vicryl sutures. I irrigated again and then placed the remaining 500 mg vancomycin powder in the wound. The remaining layers were closed with interrupted Vicryl sutures. The skin was closed with Dermabond. The patient tolerated it well.  PATIENT DISPOSITION:  PACU then floor.   Delay start of Pharmacological VTE agent (>24hrs) due to surgical blood loss or risk of bleeding:  No

## 2015-09-27 NOTE — Progress Notes (Signed)
Physical Therapy Treatment Patient Details Name: Ruth Rhodes MRN: [id number] DOB: 10/27/1933 Today's Date: 09/27/2015    History of Present Illness Pt is a 82 y/o female who presents s/p L4-L5 decompressive laminotomy and L5-S1 microdiscectomy on 09/24/15.    PT Comments    Pt progressing slowly towards physical therapy goals. Focus of session was transition to the bathroom, peri-care, and transition back to bed. Pt reports increased dizziness with standing and declined sitting in the recliner at end of session. Pt very emotional throughout session and she required redirection to task more frequently. Will continue to follow and progress as able per POC.   Follow Up Recommendations  SNF;Supervision/Assistance - 24 hour     Equipment Recommendations  Rolling walker with 5" wheels;3in1 (PT)    Recommendations for Other Services       Precautions / Restrictions Precautions Precautions: Fall;Back Precaution Booklet Issued: Yes (comment) Precaution Comments: Reviewed back precautions Required Braces or Orthoses:  (No brace required per orders) Restrictions Weight Bearing Restrictions: No    Mobility  Bed Mobility Overal bed mobility: Needs Assistance Bed Mobility: Rolling;Sidelying to Sit;Sit to Sidelying Rolling: Min assist Sidelying to sit: Min assist     Sit to sidelying: Mod assist General bed mobility comments: Assist with bed pad to roll to right side. Assist required to raise trunk to upright sitting position. Required min assist to bring LE's into bed to return to sidelying.  Transfers Overall transfer level: Needs assistance Equipment used: Rolling walker (2 wheeled) Transfers: Sit to/from Stand Sit to Stand: Min assist;+2 physical assistance         General transfer comment: Assist to power-up to full standing position. Increased time required and VC's for maintenance of back precautions.   Ambulation/Gait Ambulation/Gait assistance: Min assist;+2  safety/equipment Ambulation Distance (Feet): 10 Feet Assistive device: Rolling walker (2 wheeled) Gait Pattern/deviations: Step-through pattern;Decreased stride length Gait velocity: Decreased Gait velocity interpretation: Below normal speed for age/gender General Gait Details: Slow gait pattern with short step length.  Assist to maneuver RW in turns and around obstacles.    Stairs            Wheelchair Mobility    Modified Rankin (Stroke Patients Only)       Balance Overall balance assessment: Needs assistance Sitting-balance support: Feet supported;No upper extremity supported Sitting balance-Leahy Scale: Poor Sitting balance - Comments: Requires UE support and needs cues to return to midline   Standing balance support: Bilateral upper extremity supported;During functional activity Standing balance-Leahy Scale: Poor                      Cognition Arousal/Alertness: Awake/alert Behavior During Therapy: WFL for tasks assessed/performed Overall Cognitive Status: Within Functional Limits for tasks assessed                      Exercises      General Comments General comments (skin integrity, edema, etc.): Very tearful throughout session. Appears calm and then will have another emotional outburst and begin crying again.       Pertinent Vitals/Pain Pain Assessment: Faces Faces Pain Scale: Hurts little more Pain Location: Back Pain Descriptors / Indicators: Operative site guarding;Discomfort Pain Intervention(s): Limited activity within patient's tolerance;Monitored during session;Repositioned    Home Living                      Prior Function            PT Goals (  current goals can now be found in the care plan section) Acute Rehab PT Goals Patient Stated Goal: Go to SNF in [place] PT Goal Formulation: With patient Time For Goal Achievement: 10/09/15 Potential to Achieve Goals: Good Progress towards PT goals: Progressing toward  goals    Frequency  Min 5X/week    PT Plan Current plan remains appropriate    Co-evaluation             End of Session Equipment Utilized During Treatment: Gait belt Activity Tolerance: Patient limited by fatigue Patient left: in bed;with call bell/phone within reach;with bed alarm set     Time: 8118-8677 PT Time Calculation (min) (ACUTE ONLY): 21 min  Charges:  $Gait Training: 8-22 mins                    G Codes:      Rolinda Roan 10/21/15, 2:23 PM   Rolinda Roan, PT, DPT Acute Rehabilitation Services Pager: [phone]

## 2015-09-27 NOTE — Anesthesia Postprocedure Evaluation (Signed)
Anesthesia Post Note  Patient: Ruth Rhodes  Procedure(s) Performed: Procedure(s) (LRB): WOUND EXPLORATION (N/A)  Patient location during evaluation: PACU Anesthesia Type: General Level of consciousness: awake Pain management: pain level controlled Vital Signs Assessment: post-procedure vital signs reviewed and stable Respiratory status: spontaneous breathing Cardiovascular status: stable Anesthetic complications: no    Last Vitals:  Filed Vitals:   09/27/15 1046 09/27/15 1333  BP: 160/67 177/87  Pulse: 76 66  Temp: 36.7 C 36.4 C  Resp: 18 20    Last Pain:  Filed Vitals:   09/27/15 1335  PainSc: 0-No pain                 EDWARDS,Jullien Granquist

## 2015-09-28 ENCOUNTER — Encounter (HOSPITAL_COMMUNITY): Payer: Self-pay | Admitting: Neurological Surgery

## 2015-09-28 LAB — GLUCOSE, CAPILLARY
GLUCOSE-CAPILLARY: 230 mg/dL — AB (ref 65–99)
GLUCOSE-CAPILLARY: 265 mg/dL — AB (ref 65–99)
Glucose-Capillary: 248 mg/dL — ABNORMAL HIGH (ref 65–99)

## 2015-09-28 MED ORDER — OXYCODONE-ACETAMINOPHEN 5-325 MG PO TABS: 1.0000 | ORAL_TABLET | ORAL | Status: DC | PRN

## 2015-09-28 MED ORDER — METHOCARBAMOL 500 MG PO TABS: 500.0000 mg | ORAL_TABLET | Freq: Four times a day (QID) | ORAL | Status: DC | PRN

## 2015-09-28 NOTE — Clinical Social Work Placement (Signed)
   CLINICAL SOCIAL WORK PLACEMENT  NOTE  Date:  09/28/2015  Patient Details  Name: Ruth Rhodes MRN: [id number] Date of Birth: Feb 22, 1934  Clinical Social Work is seeking post-discharge placement for this patient at the [place] level of care (*CSW will initial, date and re-position this form in  chart as items are completed):  Yes   Patient/family provided with [place] Work Department's list of facilities offering this level of care within the geographic area requested by the patient (or if unable, by the patient's family).  Yes   Patient/family informed of their freedom to choose among providers that offer the needed level of care, that participate in Medicare, Medicaid or managed care program needed by the patient, have an available bed and are willing to accept the patient.  Yes   Patient/family informed of [facility]'s ownership interest in [facility] and [facility], as well as of the fact that they are under no obligation to receive care at these facilities.  PASRR submitted to EDS on 09/27/15     PASRR number received on 09/27/15     Existing PASRR number confirmed on       FL2 transmitted to all facilities in geographic area requested by pt/family on 09/27/15     FL2 transmitted to all facilities within larger geographic area on       Patient informed that his/her managed care company has contracts with or will negotiate with certain facilities, including the following:        Yes   Patient/family informed of bed offers received.  Patient chooses bed at  [facility] )     Physician recommends and patient chooses bed at      Patient to be transferred to  [facility] ) on 09/28/15.  Patient to be transferred to facility by  [facility] )     Patient family notified on 09/28/15 of transfer.  Name of family member notified:  CSW have made several attempts to contact pt's friends [name] and family member (1st  cousin) [name] [phone]. Unable to leave message.  PHYSICIAN Please sign FL2     Additional Comment:    _______________________________________________ Rozell Searing, LCSW 09/28/2015, 1:56 PM

## 2015-09-28 NOTE — Discharge Summary (Signed)
Date of admission: September 23, 2015  Date of discharge: September 28, 2015  Admission diagnosis: Spinal stenosis L4 L5 with grade 1 spondylolisthesis, herniated disc L5-S1 left  Discharge diagnosis: Same  Procedure performed: Bilateral L4 L5 decompressive laminotomy with Coflex device and left L5-S1 microdiscectomy on September 23, 2015, wound exploration and drain removal on September 27, 2015  Attending: Karie Chimera, M.D.  Hospital course: This patient was admitted to the hospital on the day surgery and taken to the operating room by Dr. Hal Neer for the first of the above-listed operations.  She tolerated this well.  Over the coming days her pain control remained an issue and it was suggested she be discharged to an SNF.  The day prior to her anticipated discharge the nursing staff attempted to remove her drain but were unable to do so.  I attempted to remove the drain the next day and the drain broke with a segment of the drain retained within the patient.  She was then taken back to the operating room for wound exploration and drain removal.  This was accomplished without issue.  The day after that she remained medically and neurologically stable and was ready for discharge.  Discharge medications: Resume prior medications  Follow-up: With Dr. Hal Neer in 2-3 weeks

## 2015-09-28 NOTE — Care Management Note (Signed)
Case Management Note  Patient Details  Name: EVOLETTE PENDELL MRN: [id number] Date of Birth: 08-12-1934  Subjective/Objective:                    Action/Plan: Plan is for patient to discharge to [facility] today. No further needs per CM.   Expected Discharge Date:                  Expected Discharge Plan:  [place]  In-House Referral:     Discharge planning Services     Post Acute Care Choice:    Choice offered to:     DME Arranged:    DME Agency:     HH Arranged:    [place] Agency:     Status of Service:  Completed, signed off  Medicare Important Message Given:  Yes Date Medicare IM Given:    Medicare IM give by:    Date Additional Medicare IM Given:    Additional Medicare Important Message give by:     If discussed at [facility] of Stay Meetings, dates discussed:    Additional Comments:  Pollie Friar, RN 09/28/2015, 10:59 AM

## 2015-09-28 NOTE — Clinical Social Work Note (Signed)
Clinical Social Worker facilitated patient discharge including contacting patient family and facility to confirm patient discharge plans.  Clinical information faxed to facility and family agreeable with plan.  CSW arranged ambulance transport via [facility] to [facility]  RN to call report prior to discharge.  Pt's cousin, [name] returned phone call and is agreeable and pleased with pt's decision to discharge to [facility].   Clinical Social Worker will sign off for now as social work intervention is no longer needed. Please consult [place] again if new need arises.  Glendon Axe, MSW, [place] [phone] 09/28/2015 2:20 PM

## 2015-09-28 NOTE — Progress Notes (Signed)
No acute events AVSS Stable neuro exam Ready for d/c to SNF

## 2015-09-28 NOTE — Progress Notes (Signed)
Patient is discharged from room 5C02 at this time. Alert and in stable condition. IV site d/c'd. Report given to nurse Myer Haff at [facility]. Transported via [employer] [facility] by [employer] with all belongings at side.

## 2015-09-28 NOTE — Evaluation (Signed)
Occupational Therapy Evaluation Patient Details Name: Ruth Rhodes MRN: [id number] DOB: 1934/05/12 Today's Date: 09/28/2015    History of Present Illness Pt is a 82 y/o female who presents s/p L4-L5 decompressive laminotomy and L5-S1 microdiscectomy on 09/24/15. Returned to OR for removal of drain 09/26/15.   Clinical Impression   Pt was independent prior to admission. Presents with generalized weakness, back pain, and impaired balance interfering with ability to perform ADL.  Pt lives alone and will need post acute rehab prior to return home.  Will defer further OT to SNF.    Follow Up Recommendations  SNF;Supervision/Assistance - 24 hour    Equipment Recommendations       Recommendations for Other Services       Precautions / Restrictions Precautions Precautions: Fall;Back Precaution Comments: Reviewed back precautions      Mobility Bed Mobility Overal bed mobility: Needs Assistance Bed Mobility: Rolling;Sidelying to Sit Rolling: Min guard Sidelying to sit: Min guard       General bed mobility comments: assisted only to bring hips to EOB with pad  Transfers Overall transfer level: Needs assistance Equipment used: Rolling walker (2 wheeled) Transfers: Sit to/from Stand Sit to Stand: Min guard         General transfer comment: verbal cues for hand placement    Balance                                            ADL Overall ADL's : Needs assistance/impaired     Grooming: Wash/dry hands;Wash/dry face;Sitting;Set up   Upper Body Bathing: Minimal assitance;Sitting Upper Body Bathing Details (indicate cue type and reason): assist for back Lower Body Bathing: Minimal assistance;Sit to/from stand   Upper Body Dressing : Set up;Sitting   Lower Body Dressing: Minimal assistance;Sit to/from stand   Toilet Transfer: Minimal assistance;Stand-pivot;BSC   Toileting- Clothing Manipulation and Hygiene: Sit to/from stand;Minimal assistance       Functional mobility during ADLs: Herbalist     Praxis      Pertinent Vitals/Pain Pain Assessment: 0-10 Pain Score: 8  Pain Location: back Pain Descriptors / Indicators: Guarding Pain Intervention(s): Limited activity within patient's tolerance;Monitored during session;Premedicated before session;Repositioned     Hand Dominance     Extremity/Trunk Assessment             Communication     Cognition Arousal/Alertness: Awake/alert Behavior During Therapy: WFL for tasks assessed/performed Overall Cognitive Status: Within Functional Limits for tasks assessed                     General Comments       Exercises       Shoulder Instructions      Home Living                                          Prior Functioning/Environment               OT Diagnosis:     OT Problem List:     OT Treatment/Interventions:      OT Goals(Current goals can be found in the care plan section) Acute Rehab OT Goals Patient Stated Goal: Go to SNF in [place]  OT Frequency: Min 2X/week   Barriers to D/C:            Co-evaluation              End of Session Equipment Utilized During Treatment: Rolling walker  Activity Tolerance: Patient tolerated treatment well Patient left: in chair;with call bell/phone within reach;with chair alarm set   Time: 3154-0086 OT Time Calculation (min): 46 min Charges:  OT General Charges $OT Visit: 1 Procedure OT Evaluation $OT Eval Moderate Complexity: 1 Procedure OT Treatments $Self Care/Home Management : 23-37 mins G-Codes:    Malka So 09/28/2015, 11:27 AM  [phone]

## 2015-09-29 ENCOUNTER — Encounter
Admission: RE | Admit: 2015-09-29 | Discharge: 2015-09-29 | Disposition: A | Discharge status: Home / Self Care | Payer: Medicare Other | Source: Ambulatory Visit | Attending: Internal Medicine | Admitting: Internal Medicine

## 2015-09-29 ENCOUNTER — Other Ambulatory Visit
Admission: RE | Admit: 2015-09-29 | Payer: Medicare Other | Source: Skilled Nursing Facility | Admitting: Internal Medicine

## 2015-09-29 DIAGNOSIS — E119 Type 2 diabetes mellitus without complications: Secondary | ICD-10-CM | POA: Diagnosis present

## 2015-09-29 DIAGNOSIS — E876 Hypokalemia: Secondary | ICD-10-CM | POA: Diagnosis present

## 2015-09-29 DIAGNOSIS — Z794 Long term (current) use of insulin: Secondary | ICD-10-CM | POA: Diagnosis not present

## 2015-09-29 DIAGNOSIS — R350 Frequency of micturition: Secondary | ICD-10-CM | POA: Diagnosis not present

## 2015-09-29 LAB — URINALYSIS COMPLETE WITH MICROSCOPIC (ARMC ONLY)
Bacteria, UA: NONE SEEN
Bilirubin Urine: NEGATIVE
Glucose, UA: >500 mg/dL — AB
Hgb urine dipstick: NEGATIVE
Leukocytes, UA: NEGATIVE
Nitrite: NEGATIVE
PROTEIN: NEGATIVE mg/dL
Specific Gravity, Urine: 1.031 — ABNORMAL HIGH (ref 1.005–1.030)
pH: 5 (ref 5.0–8.0)

## 2015-09-29 LAB — GLUCOSE, CAPILLARY: Glucose-Capillary: 218 mg/dL — ABNORMAL HIGH (ref 65–99)

## 2015-09-30 DIAGNOSIS — E876 Hypokalemia: Secondary | ICD-10-CM | POA: Diagnosis not present

## 2015-09-30 LAB — CBC WITH DIFFERENTIAL/PLATELET
BASOS ABS: 0 10*3/uL (ref 0–0.1)
BASOS PCT: 0 %
EOS ABS: 0.3 10*3/uL (ref 0–0.7)
EOS PCT: 5 %
HCT: 37.9 % (ref 35.0–47.0)
Hemoglobin: 12.9 g/dL (ref 12.0–16.0)
Lymphocytes Relative: 32 %
Lymphs Abs: 2 10*3/uL (ref 1.0–3.6)
MCH: 28.4 pg (ref 26.0–34.0)
MCHC: 34 g/dL (ref 32.0–36.0)
MCV: 83.6 fL (ref 80.0–100.0)
Monocytes Absolute: 1.1 10*3/uL — ABNORMAL HIGH (ref 0.2–0.9)
Monocytes Relative: 18 %
Neutro Abs: 2.8 10*3/uL (ref 1.4–6.5)
Neutrophils Relative %: 45 %
PLATELETS: 320 10*3/uL (ref 150–440)
RBC: 4.54 MIL/uL (ref 3.80–5.20)
RDW: 14.5 % (ref 11.5–14.5)
WBC: 6.2 10*3/uL (ref 3.6–11.0)

## 2015-09-30 LAB — COMPREHENSIVE METABOLIC PANEL
ALT: 12 U/L — AB (ref 14–54)
AST: 17 U/L (ref 15–41)
Albumin: 3.3 g/dL — ABNORMAL LOW (ref 3.5–5.0)
Alkaline Phosphatase: 66 U/L (ref 38–126)
Anion gap: 8 (ref 5–15)
BUN: 11 mg/dL (ref 6–20)
CHLORIDE: 101 mmol/L (ref 101–111)
CO2: 29 mmol/L (ref 22–32)
CREATININE: 0.69 mg/dL (ref 0.44–1.00)
Calcium: 9.3 mg/dL (ref 8.9–10.3)
GFR calc Af Amer: >60 mL/min (ref >60)
GFR calc non Af Amer: >60 mL/min (ref >60)
Glucose, Bld: 179 mg/dL — ABNORMAL HIGH (ref 65–99)
Potassium: 3.7 mmol/L (ref 3.5–5.1)
SODIUM: 138 mmol/L (ref 135–145)
TOTAL PROTEIN: 6.8 g/dL (ref 6.5–8.1)
Total Bilirubin: 0.9 mg/dL (ref 0.3–1.2)

## 2015-09-30 LAB — GLUCOSE, CAPILLARY
GLUCOSE-CAPILLARY: 135 mg/dL — AB (ref 65–99)
GLUCOSE-CAPILLARY: 176 mg/dL — AB (ref 65–99)
Glucose-Capillary: 175 mg/dL — ABNORMAL HIGH (ref 65–99)

## 2015-10-01 DIAGNOSIS — E876 Hypokalemia: Secondary | ICD-10-CM | POA: Diagnosis not present

## 2015-10-01 LAB — GLUCOSE, CAPILLARY
Glucose-Capillary: 124 mg/dL — ABNORMAL HIGH (ref 65–99)
Glucose-Capillary: 170 mg/dL — ABNORMAL HIGH (ref 65–99)
Glucose-Capillary: 193 mg/dL — ABNORMAL HIGH (ref 65–99)

## 2015-10-01 LAB — URINE CULTURE: CULTURE: NO GROWTH

## 2015-10-01 LAB — VITAMIN B12: VITAMIN B 12: 1742 pg/mL — AB (ref 180–914)

## 2015-10-02 DIAGNOSIS — E876 Hypokalemia: Secondary | ICD-10-CM | POA: Diagnosis not present

## 2015-10-02 LAB — GLUCOSE, CAPILLARY
GLUCOSE-CAPILLARY: 194 mg/dL — AB (ref 65–99)
Glucose-Capillary: 121 mg/dL — ABNORMAL HIGH (ref 65–99)

## 2015-10-03 LAB — GLUCOSE, CAPILLARY
GLUCOSE-CAPILLARY: 153 mg/dL — AB (ref 65–99)
GLUCOSE-CAPILLARY: 139 mg/dL — AB (ref 65–99)
GLUCOSE-CAPILLARY: 268 mg/dL — AB (ref 65–99)
Glucose-Capillary: 133 mg/dL — ABNORMAL HIGH (ref 65–99)
Glucose-Capillary: 110 mg/dL — ABNORMAL HIGH (ref 65–99)
Glucose-Capillary: 113 mg/dL — ABNORMAL HIGH (ref 65–99)
Glucose-Capillary: 263 mg/dL — ABNORMAL HIGH (ref 65–99)

## 2015-10-04 DIAGNOSIS — E876 Hypokalemia: Secondary | ICD-10-CM | POA: Diagnosis not present

## 2015-10-05 LAB — GLUCOSE, CAPILLARY
GLUCOSE-CAPILLARY: 115 mg/dL — AB (ref 65–99)
GLUCOSE-CAPILLARY: 187 mg/dL — AB (ref 65–99)
GLUCOSE-CAPILLARY: 174 mg/dL — AB (ref 65–99)
GLUCOSE-CAPILLARY: 300 mg/dL — AB (ref 65–99)
Glucose-Capillary: 134 mg/dL — ABNORMAL HIGH (ref 65–99)
Glucose-Capillary: 184 mg/dL — ABNORMAL HIGH (ref 65–99)
Glucose-Capillary: 176 mg/dL — ABNORMAL HIGH (ref 65–99)

## 2015-10-06 DIAGNOSIS — E876 Hypokalemia: Secondary | ICD-10-CM | POA: Diagnosis not present

## 2015-10-06 LAB — GLUCOSE, CAPILLARY
GLUCOSE-CAPILLARY: 121 mg/dL — AB (ref 65–99)
GLUCOSE-CAPILLARY: 208 mg/dL — AB (ref 65–99)
Glucose-Capillary: 166 mg/dL — ABNORMAL HIGH (ref 65–99)
Glucose-Capillary: 133 mg/dL — ABNORMAL HIGH (ref 65–99)

## 2015-10-08 DIAGNOSIS — E876 Hypokalemia: Secondary | ICD-10-CM | POA: Diagnosis not present

## 2015-10-08 HISTORY — PX: LAMINOTOMY: SHX998

## 2015-10-08 LAB — GLUCOSE, CAPILLARY
GLUCOSE-CAPILLARY: 186 mg/dL — AB (ref 65–99)
GLUCOSE-CAPILLARY: 114 mg/dL — AB (ref 65–99)
GLUCOSE-CAPILLARY: 111 mg/dL — AB (ref 65–99)
GLUCOSE-CAPILLARY: 153 mg/dL — AB (ref 65–99)
GLUCOSE-CAPILLARY: 181 mg/dL — AB (ref 65–99)
Glucose-Capillary: 131 mg/dL — ABNORMAL HIGH (ref 65–99)
Glucose-Capillary: 170 mg/dL — ABNORMAL HIGH (ref 65–99)
Glucose-Capillary: 147 mg/dL — ABNORMAL HIGH (ref 65–99)

## 2015-10-09 DIAGNOSIS — E876 Hypokalemia: Secondary | ICD-10-CM | POA: Diagnosis not present

## 2015-10-09 LAB — GLUCOSE, CAPILLARY
GLUCOSE-CAPILLARY: 132 mg/dL — AB (ref 65–99)
Glucose-Capillary: 110 mg/dL — ABNORMAL HIGH (ref 65–99)

## 2015-10-10 LAB — GLUCOSE, CAPILLARY
GLUCOSE-CAPILLARY: 149 mg/dL — AB (ref 65–99)
GLUCOSE-CAPILLARY: 150 mg/dL — AB (ref 65–99)

## 2015-10-11 DIAGNOSIS — E876 Hypokalemia: Secondary | ICD-10-CM | POA: Diagnosis not present

## 2015-10-11 LAB — GLUCOSE, CAPILLARY
GLUCOSE-CAPILLARY: 105 mg/dL — AB (ref 65–99)
GLUCOSE-CAPILLARY: 152 mg/dL — AB (ref 65–99)
GLUCOSE-CAPILLARY: 163 mg/dL — AB (ref 65–99)
Glucose-Capillary: 248 mg/dL — ABNORMAL HIGH (ref 65–99)
Glucose-Capillary: 173 mg/dL — ABNORMAL HIGH (ref 65–99)
Glucose-Capillary: 165 mg/dL — ABNORMAL HIGH (ref 65–99)

## 2015-10-12 DIAGNOSIS — E876 Hypokalemia: Secondary | ICD-10-CM | POA: Diagnosis not present

## 2015-10-12 LAB — GLUCOSE, CAPILLARY
Glucose-Capillary: 173 mg/dL — ABNORMAL HIGH (ref 65–99)
Glucose-Capillary: 160 mg/dL — ABNORMAL HIGH (ref 65–99)

## 2015-10-13 DIAGNOSIS — E876 Hypokalemia: Secondary | ICD-10-CM | POA: Diagnosis not present

## 2015-10-13 LAB — GLUCOSE, CAPILLARY
Glucose-Capillary: 112 mg/dL — ABNORMAL HIGH (ref 65–99)
Glucose-Capillary: 162 mg/dL — ABNORMAL HIGH (ref 65–99)
Glucose-Capillary: 113 mg/dL — ABNORMAL HIGH (ref 65–99)
Glucose-Capillary: 104 mg/dL — ABNORMAL HIGH (ref 65–99)

## 2015-10-14 LAB — GLUCOSE, CAPILLARY: GLUCOSE-CAPILLARY: 112 mg/dL — AB (ref 65–99)

## 2016-01-09 ENCOUNTER — Emergency Department
Admission: EM | Admit: 2016-01-09 | Discharge: 2016-01-10 | Disposition: A | Discharge status: Home / Self Care | Payer: Medicare Other | Attending: Emergency Medicine | Admitting: Emergency Medicine

## 2016-01-09 ENCOUNTER — Emergency Department: Payer: Medicare Other

## 2016-01-09 ENCOUNTER — Encounter: Payer: Self-pay | Admitting: *Deleted

## 2016-01-09 DIAGNOSIS — R1032 Left lower quadrant pain: Secondary | ICD-10-CM | POA: Diagnosis not present

## 2016-01-09 DIAGNOSIS — I10 Essential (primary) hypertension: Secondary | ICD-10-CM | POA: Insufficient documentation

## 2016-01-09 DIAGNOSIS — M1990 Unspecified osteoarthritis, unspecified site: Secondary | ICD-10-CM | POA: Diagnosis not present

## 2016-01-09 DIAGNOSIS — Z87891 Personal history of nicotine dependence: Secondary | ICD-10-CM | POA: Insufficient documentation

## 2016-01-09 DIAGNOSIS — Z79899 Other long term (current) drug therapy: Secondary | ICD-10-CM | POA: Diagnosis not present

## 2016-01-09 DIAGNOSIS — E119 Type 2 diabetes mellitus without complications: Secondary | ICD-10-CM | POA: Diagnosis not present

## 2016-01-09 LAB — COMPREHENSIVE METABOLIC PANEL
ALBUMIN: 4 g/dL (ref 3.5–5.0)
ALK PHOS: 76 U/L (ref 38–126)
ALT: 12 U/L — AB (ref 14–54)
AST: 16 U/L (ref 15–41)
Anion gap: 7 (ref 5–15)
BUN: 18 mg/dL (ref 6–20)
CALCIUM: 9.7 mg/dL (ref 8.9–10.3)
CHLORIDE: 104 mmol/L (ref 101–111)
CO2: 29 mmol/L (ref 22–32)
CREATININE: 1.05 mg/dL — AB (ref 0.44–1.00)
GFR calc Af Amer: 57 mL/min — ABNORMAL LOW (ref >60)
GFR calc non Af Amer: 49 mL/min — ABNORMAL LOW (ref >60)
GLUCOSE: 199 mg/dL — AB (ref 65–99)
Potassium: 3.4 mmol/L — ABNORMAL LOW (ref 3.5–5.1)
SODIUM: 140 mmol/L (ref 135–145)
Total Bilirubin: 0.4 mg/dL (ref 0.3–1.2)
Total Protein: 7.1 g/dL (ref 6.5–8.1)

## 2016-01-09 LAB — CBC
HCT: 38.6 % (ref 35.0–47.0)
HEMOGLOBIN: 13.4 g/dL (ref 12.0–16.0)
MCH: 29.7 pg (ref 26.0–34.0)
MCHC: 34.8 g/dL (ref 32.0–36.0)
MCV: 85.4 fL (ref 80.0–100.0)
Platelets: 261 10*3/uL (ref 150–440)
RBC: 4.51 MIL/uL (ref 3.80–5.20)
RDW: 14.2 % (ref 11.5–14.5)
WBC: 6.9 10*3/uL (ref 3.6–11.0)

## 2016-01-09 LAB — URINALYSIS COMPLETE WITH MICROSCOPIC (ARMC ONLY)
BILIRUBIN URINE: NEGATIVE
Bacteria, UA: NONE SEEN
Glucose, UA: 50 mg/dL — AB
Hgb urine dipstick: NEGATIVE
KETONES UR: NEGATIVE mg/dL
Leukocytes, UA: NEGATIVE
NITRITE: NEGATIVE
PH: 5 (ref 5.0–8.0)
Protein, ur: NEGATIVE mg/dL
Specific Gravity, Urine: 1.019 (ref 1.005–1.030)

## 2016-01-09 LAB — LIPASE, BLOOD: LIPASE: 29 U/L (ref 11–51)

## 2016-01-09 LAB — TROPONIN I: Troponin I: <0.03 ng/mL (ref <0.031)

## 2016-01-09 MED ORDER — DOCUSATE SODIUM 100 MG PO CAPS: 200.0000 mg | ORAL_CAPSULE | Freq: Two times a day (BID) | ORAL | Status: DC

## 2016-01-09 MED ORDER — SENNA 8.6 MG PO TABS: 2.0000 | ORAL_TABLET | Freq: Two times a day (BID) | ORAL | Status: DC

## 2016-01-09 MED ORDER — IOPAMIDOL (ISOVUE-300) INJECTION 61%
100.0000 mL | Freq: Once | INTRAVENOUS | Status: AC | PRN
  Administered 2016-01-09: 100 mL via INTRAVENOUS

## 2016-01-09 MED ORDER — DIATRIZOATE MEGLUMINE & SODIUM 66-10 % PO SOLN
15.0000 mL | Freq: Once | ORAL | Status: AC
  Administered 2016-01-09: 15 mL via ORAL

## 2016-01-09 NOTE — ED Notes (Signed)
Pt assisted up to commode to void.  

## 2016-01-09 NOTE — ED Notes (Signed)
Pt declines offer for pain medication.

## 2016-01-09 NOTE — Discharge Instructions (Signed)

## 2016-01-09 NOTE — ED Notes (Signed)
Patient transported to CT 

## 2016-01-09 NOTE — ED Notes (Signed)
md in to assess pt.

## 2016-01-09 NOTE — ED Notes (Signed)
Pt c/o LLQ abdominal pain x 4 days, worsening over time. Pt denies urinary sxs, n/v/d, and fever. Pt c/o tenderness to LLQ and is tender to palpation in triage. Pt c/o new onset of dizziness, occurring only when standing upright.

## 2016-01-09 NOTE — ED Provider Notes (Signed)
[facility] Emergency Department Provider Note  ____________________________________________  Time seen: 9:00 PM  I have reviewed the triage vital signs and the nursing notes.   HISTORY  Chief Complaint Abdominal Pain    HPI Ruth Rhodes is a 82 y.o. female who complains of gradual onset left lower quadrant abdominal pain which is been constant for the past 3 days. Steady, not worsening. Has occasional nausea but no vomiting or diarrhea. No constipation, normal bowel movements. Has had a few episodes of dizziness when standing as well. No syncope. No chest pain shortness of breath fevers or head injury. Currently moderate in intensity. No aggravating or alleviating factors. Nonradiating.    Past Medical History  Diagnosis Date  . Hypertension   . Arthritis   . Diabetes mellitus without complication ([place])   . Back pain      Patient Active Problem List   Diagnosis Date Noted  . Lumbar stenosis with neurogenic claudication 09/23/2015     Past Surgical History  Procedure Laterality Date  . Abdominal hysterectomy    . Lumbar laminectomy with coflex 2 level Bilateral 09/23/2015    Procedure: Laminectomy and Foraminotomy L4-L5 bilateral with CoFlex L5-S1 - left;  Surgeon: Karie Chimera, MD;  Location: [place] [facility];  Service: Neurosurgery;  Laterality: Bilateral;  . Wound exploration N/A 09/27/2015    Procedure: WOUND EXPLORATION;  Surgeon: Kevan Ny Ditty, MD;  Location: [place] [facility];  Service: Neurosurgery;  Laterality: N/A;  WOUND EXPLORATION     Current Outpatient Rx  Name  Route  Sig  Dispense  Refill  . BYSTOLIC 20 MG TABS   Oral   Take 20 mg by mouth daily.            Dispense as written.   . gabapentin (NEURONTIN) 400 MG capsule   Oral   Take 400 mg by mouth 3 (three) times daily.         Marland Kitchen lisinopril (PRINIVIL,ZESTRIL) 20 MG tablet   Oral   Take 20 mg by mouth daily.          Marland Kitchen oxyCODONE-acetaminophen (ROXICET) 5-325 MG  tablet   Oral   Take 1 tablet by mouth every 4 (four) hours as needed for severe pain.   60 tablet   0   . docusate sodium (COLACE) 100 MG capsule   Oral   Take 2 capsules (200 mg total) by mouth 2 (two) times daily.   120 capsule   0   . methocarbamol (ROBAXIN) 500 MG tablet   Oral   Take 1 tablet (500 mg total) by mouth every 6 (six) hours as needed for muscle spasms.   120 tablet   1   . senna (SENOKOT) 8.6 MG TABS tablet   Oral   Take 2 tablets (17.2 mg total) by mouth 2 (two) times daily.   120 each   0      Allergies Tramadol   History reviewed. No pertinent family history.  Social History Social History  Substance Use Topics  . Smoking status: Former Research scientist (life sciences)  . Smokeless tobacco: Never Used  . Alcohol Use: No    Review of Systems  Constitutional:   No fever Or positive chills  Eyes:   No vision changes.  ENT:   No sore throat. No rhinorrhea. Cardiovascular:   No chest pain. Respiratory:   No dyspnea or cough. Gastrointestinal:   Positive left lower quadrant abdominal pain without vomiting and diarrhea.  No bloody stool. Genitourinary:  Negative for dysuria or difficulty urinating. Musculoskeletal:   Negative for focal pain or swelling Neurological:   Negative for headaches 10-point ROS otherwise negative.  ____________________________________________   PHYSICAL EXAM:  VITAL SIGNS: ED Triage Vitals  Enc Vitals Group     BP 01/09/16 1943 167/89 mmHg     Pulse Rate 01/09/16 1943 64     Resp 01/09/16 1943 18     Temp 01/09/16 1943 97.8 F (36.6 C)     Temp Source 01/09/16 1943 Oral     SpO2 01/09/16 1943 97 %     Weight 01/09/16 1943 130 lb (58.968 kg)     Height 01/09/16 1943 '5\' 2"'$  (1.575 m)     Head Cir --      Peak Flow --      Pain Score 01/09/16 2000 9     Pain Loc --      Pain Edu? --      Excl. in [place]? --     Vital signs reviewed, nursing assessments reviewed.   Constitutional:   Alert and oriented. Well appearing and in no  distress. Eyes:   No scleral icterus. No conjunctival pallor. PERRL. EOMI ENT   Head:   Normocephalic and atraumatic.   Nose:   No congestion/rhinnorhea. No septal hematoma   Mouth/Throat:   MMM, no pharyngeal erythema. No peritonsillar mass.    Neck:   No stridor. No SubQ emphysema. No meningismus. Hematological/Lymphatic/Immunilogical:   No cervical lymphadenopathy. Cardiovascular:   RRR. Symmetric bilateral radial and DP pulses.  No murmurs.  Respiratory:   Normal respiratory effort without tachypnea nor retractions. Breath sounds are clear and equal bilaterally. No wheezes/rales/rhonchi. Gastrointestinal:   Soft With focal left lower quadrant tenderness. Non distended. There is no CVA tenderness.  No rebound, rigidity, or guarding. Genitourinary:   deferred Musculoskeletal:   Nontender with normal range of motion in all extremities. No joint effusions.  No lower extremity tenderness.  No edema. Neurologic:   Normal speech and language.  CN 2-10 normal. Motor grossly intact. No gross focal neurologic deficits are appreciated.  Skin:    Skin is warm, dry and intact. No rash noted.  No petechiae, purpura, or bullae.  ____________________________________________    LABS (pertinent positives/negatives) (all labs ordered are listed, but only abnormal results are displayed) Labs Reviewed  COMPREHENSIVE METABOLIC PANEL - Abnormal; Notable for the following:    Potassium 3.4 (*)    Glucose, Bld 199 (*)    Creatinine, Ser 1.05 (*)    ALT 12 (*)    GFR calc non Af Amer 49 (*)    GFR calc Af Amer 57 (*)    All other components within normal limits  URINALYSIS COMPLETEWITH MICROSCOPIC ([facility] ONLY) - Abnormal; Notable for the following:    Color, Urine YELLOW (*)    APPearance CLOUDY (*)    Glucose, UA 50 (*)    Squamous Epithelial / LPF 0-5 (*)    All other components within normal limits  LIPASE, BLOOD  CBC  TROPONIN I    ____________________________________________   EKG  Interpreted by me Normal sinus rhythm rate of 60, normal axis, normal intervals. Incomplete right bundle-branch block. T-wave inversions in V2 and V3. Normal ST segments.Unchanged from 09/17/2015  ____________________________________________    RADIOLOGY  CT abdomen and pelvis unremarkable. Patient has diverticulosis but no diverticulitis or other complications  ____________________________________________   PROCEDURES   ____________________________________________   INITIAL IMPRESSION / ASSESSMENT AND PLAN / ED COURSE  Pertinent labs &  imaging results that were available during my care of the patient were reviewed by me and considered in my medical decision making (see chart for details).  Well-appearing no acute distress. Exam concerning for diverticulitis. I proceeded with CT scan which was unremarkable. Vital signs unremarkable, labs unremarkable. I'll put her on senna and Colace and have her follow up with primary care.     ____________________________________________   FINAL CLINICAL IMPRESSION(S) / ED DIAGNOSES  Final diagnoses:  LLQ abdominal pain       Portions of this note were generated with dragon dictation software. Dictation errors may occur despite best attempts at proofreading.   Carrie Mew, MD 01/10/16 0000

## 2016-01-09 NOTE — ED Notes (Signed)
Pt states llq pain for 3 days with associated intermittent nausea today. Pt states she has been "dizzy" when standing today x2. Skin normal color warm and dry, resps unlabored. Pt denies vomiting, diarrhea, known fever, but states has had chills. Pt denies dysuria.

## 2016-01-28 ENCOUNTER — Emergency Department
Admission: EM | Admit: 2016-01-28 | Discharge: 2016-01-28 | Disposition: A | Discharge status: Home / Self Care | Payer: Medicare Other | Attending: Emergency Medicine | Admitting: Emergency Medicine

## 2016-01-28 DIAGNOSIS — Z90710 Acquired absence of both cervix and uterus: Secondary | ICD-10-CM | POA: Insufficient documentation

## 2016-01-28 DIAGNOSIS — I10 Essential (primary) hypertension: Secondary | ICD-10-CM | POA: Insufficient documentation

## 2016-01-28 DIAGNOSIS — K5732 Diverticulitis of large intestine without perforation or abscess without bleeding: Secondary | ICD-10-CM | POA: Insufficient documentation

## 2016-01-28 DIAGNOSIS — M1990 Unspecified osteoarthritis, unspecified site: Secondary | ICD-10-CM | POA: Diagnosis not present

## 2016-01-28 DIAGNOSIS — Z79899 Other long term (current) drug therapy: Secondary | ICD-10-CM | POA: Diagnosis not present

## 2016-01-28 DIAGNOSIS — Z87891 Personal history of nicotine dependence: Secondary | ICD-10-CM | POA: Insufficient documentation

## 2016-01-28 DIAGNOSIS — R1032 Left lower quadrant pain: Secondary | ICD-10-CM | POA: Diagnosis present

## 2016-01-28 DIAGNOSIS — E119 Type 2 diabetes mellitus without complications: Secondary | ICD-10-CM | POA: Insufficient documentation

## 2016-01-28 LAB — CBC WITH DIFFERENTIAL/PLATELET
BASOS ABS: 0 10*3/uL (ref 0–0.1)
Basophils Relative: 0 %
EOS ABS: 0.2 10*3/uL (ref 0–0.7)
EOS PCT: 3 %
HCT: 39.8 % (ref 35.0–47.0)
Hemoglobin: 14.1 g/dL (ref 12.0–16.0)
Lymphocytes Relative: 42 %
Lymphs Abs: 3.1 10*3/uL (ref 1.0–3.6)
MCH: 29.8 pg (ref 26.0–34.0)
MCHC: 35.3 g/dL (ref 32.0–36.0)
MCV: 84.2 fL (ref 80.0–100.0)
Monocytes Absolute: 1 10*3/uL — ABNORMAL HIGH (ref 0.2–0.9)
Monocytes Relative: 14 %
Neutro Abs: 3 10*3/uL (ref 1.4–6.5)
Neutrophils Relative %: 41 %
PLATELETS: 258 10*3/uL (ref 150–440)
RBC: 4.72 MIL/uL (ref 3.80–5.20)
RDW: 13.8 % (ref 11.5–14.5)
WBC: 7.4 10*3/uL (ref 3.6–11.0)

## 2016-01-28 LAB — COMPREHENSIVE METABOLIC PANEL
ALT: 17 U/L (ref 14–54)
AST: 19 U/L (ref 15–41)
Albumin: 4.1 g/dL (ref 3.5–5.0)
Alkaline Phosphatase: 85 U/L (ref 38–126)
Anion gap: 9 (ref 5–15)
BUN: 13 mg/dL (ref 6–20)
CHLORIDE: 103 mmol/L (ref 101–111)
CO2: 28 mmol/L (ref 22–32)
CREATININE: 0.68 mg/dL (ref 0.44–1.00)
Calcium: 10 mg/dL (ref 8.9–10.3)
GFR calc Af Amer: >60 mL/min (ref >60)
GFR calc non Af Amer: >60 mL/min (ref >60)
Glucose, Bld: 166 mg/dL — ABNORMAL HIGH (ref 65–99)
Potassium: 3.2 mmol/L — ABNORMAL LOW (ref 3.5–5.1)
SODIUM: 140 mmol/L (ref 135–145)
Total Bilirubin: 0.7 mg/dL (ref 0.3–1.2)
Total Protein: 7.3 g/dL (ref 6.5–8.1)

## 2016-01-28 LAB — LIPASE, BLOOD: Lipase: 21 U/L (ref 11–51)

## 2016-01-28 MED ORDER — CIPROFLOXACIN HCL 500 MG PO TABS
500.0000 mg | ORAL_TABLET | Freq: Once | ORAL | Status: AC
  Administered 2016-01-28: 500 mg via ORAL
  Filled 2016-01-28: qty 1

## 2016-01-28 MED ORDER — MORPHINE SULFATE (PF) 2 MG/ML IV SOLN
2.0000 mg | Freq: Once | INTRAVENOUS | Status: AC
  Administered 2016-01-28: 2 mg via INTRAVENOUS
  Filled 2016-01-28: qty 1

## 2016-01-28 MED ORDER — ONDANSETRON HCL 4 MG/2ML IJ SOLN
4.0000 mg | Freq: Once | INTRAMUSCULAR | Status: AC
  Administered 2016-01-28: 4 mg via INTRAVENOUS
  Filled 2016-01-28: qty 2

## 2016-01-28 MED ORDER — CIPROFLOXACIN HCL 500 MG PO TABS: 500.0000 mg | ORAL_TABLET | Freq: Two times a day (BID) | ORAL | Status: AC

## 2016-01-28 MED ORDER — METRONIDAZOLE 500 MG PO TABS
500.0000 mg | ORAL_TABLET | Freq: Once | ORAL | Status: AC
  Administered 2016-01-28: 500 mg via ORAL
  Filled 2016-01-28: qty 1

## 2016-01-28 MED ORDER — METRONIDAZOLE 500 MG PO TABS: 500.0000 mg | ORAL_TABLET | Freq: Two times a day (BID) | ORAL | Status: AC

## 2016-01-28 NOTE — Discharge Instructions (Signed)
Diverticulitis °Diverticulitis is inflammation or infection of small pouches in your colon that form when you have a condition called diverticulosis. The pouches in your colon are called diverticula. Your colon, or large intestine, is where water is absorbed and stool is formed. °Complications of diverticulitis can include: °· Bleeding. °· Severe infection. °· Severe pain. °· Perforation of your colon. °· Obstruction of your colon. °CAUSES  °Diverticulitis is caused by bacteria. °Diverticulitis happens when stool becomes trapped in diverticula. This allows bacteria to grow in the diverticula, which can lead to inflammation and infection. °RISK FACTORS °People with diverticulosis are at risk for diverticulitis. Eating a diet that does not include enough fiber from fruits and vegetables may make diverticulitis more likely to develop. °SYMPTOMS  °Symptoms of diverticulitis may include: °· Abdominal pain and tenderness. The pain is normally located on the left side of the abdomen, but may occur in other areas. °· Fever and chills. °· Bloating. °· Cramping. °· Nausea. °· Vomiting. °· Constipation. °· Diarrhea. °· Blood in your stool. °DIAGNOSIS  °Your health care provider will ask you about your medical history and do a physical exam. You may need to have tests done because many medical conditions can cause the same symptoms as diverticulitis. Tests may include: °· Blood tests. °· Urine tests. °· Imaging tests of the abdomen, including X-rays and CT scans. °When your condition is under control, your health care provider may recommend that you have a colonoscopy. A colonoscopy can show how severe your diverticula are and whether something else is causing your symptoms. °TREATMENT  °Most cases of diverticulitis are mild and can be treated at home. Treatment may include: °· Taking over-the-counter pain medicines. °· Following a clear liquid diet. °· Taking antibiotic medicines by mouth for 7-10 days. °More severe cases may  be treated at a hospital. Treatment may include: °· Not eating or drinking. °· Taking prescription pain medicine. °· Receiving antibiotic medicines through an IV tube. °· Receiving fluids and nutrition through an IV tube. °· Surgery. °HOME CARE INSTRUCTIONS  °· Follow your health care provider's instructions carefully. °· Follow a full liquid diet or other diet as directed by your health care provider. After your symptoms improve, your health care provider may tell you to change your diet. He or she may recommend you eat a high-fiber diet. Fruits and vegetables are good sources of fiber. Fiber makes it easier to pass stool. °· Take fiber supplements or probiotics as directed by your health care provider. °· Only take medicines as directed by your health care provider. °· Keep all your follow-up appointments. °SEEK MEDICAL CARE IF:  °· Your pain does not improve. °· You have a hard time eating food. °· Your bowel movements do not return to normal. °SEEK IMMEDIATE MEDICAL CARE IF:  °· Your pain becomes worse. °· Your symptoms do not get better. °· Your symptoms suddenly get worse. °· You have a fever. °· You have repeated vomiting. °· You have bloody or black, tarry stools. °MAKE SURE YOU:  °· Understand these instructions. °· Will watch your condition. °· Will get help right away if you are not doing well or get worse. °  °This information is not intended to replace advice given to you by your health care provider. Make sure you discuss any questions you have with your health care provider. °  °Document Released: 06/14/2005 Document Revised: 09/09/2013 Document Reviewed: 07/30/2013 °Elsevier Interactive Patient Education ©2016 [employer] ° °

## 2016-01-28 NOTE — ED Notes (Signed)
Dr. Brown at bedside

## 2016-01-28 NOTE — ED Notes (Signed)
Pt in with co left sided lower abd pain x 2 days denies any n.v.d

## 2016-01-28 NOTE — ED Provider Notes (Signed)
[facility] Emergency Department Provider Note  ____________________________________________  Time seen: 4:30  I have reviewed the triage vital signs and the nursing notes.   HISTORY  Chief Complaint Abdominal Pain      HPI Ruth Rhodes is a 82 y.o. female presents with left lower quadrant abdominal pain 2 days. Patient denies any nausea vomiting diarrhea. Patient denies any melena or blood noted in her stool. Patient denies any fever. Patient states her current pain score is 5 out of 10. Review of the patient's chart revealed that she was seen on 01/10/2016 with same complaint CT scan at that time reveals scattered sigmoid diverticulosis without evidence of diverticulitis.     Past Medical History  Diagnosis Date  . Hypertension   . Arthritis   . Diabetes mellitus without complication ([place])   . Back pain     Patient Active Problem List   Diagnosis Date Noted  . Lumbar stenosis with neurogenic claudication 09/23/2015    Past Surgical History  Procedure Laterality Date  . Abdominal hysterectomy    . Lumbar laminectomy with coflex 2 level Bilateral 09/23/2015    Procedure: Laminectomy and Foraminotomy L4-L5 bilateral with CoFlex L5-S1 - left;  Surgeon: Karie Chimera, MD;  Location: [place] [facility];  Service: Neurosurgery;  Laterality: Bilateral;  . Wound exploration N/A 09/27/2015    Procedure: WOUND EXPLORATION;  Surgeon: Kevan Ny Ditty, MD;  Location: [place] [facility];  Service: Neurosurgery;  Laterality: N/A;  WOUND EXPLORATION    Current Outpatient Rx  Name  Route  Sig  Dispense  Refill  . BYSTOLIC 20 MG TABS   Oral   Take 20 mg by mouth daily.            Dispense as written.   . docusate sodium (COLACE) 100 MG capsule   Oral   Take 2 capsules (200 mg total) by mouth 2 (two) times daily.   120 capsule   0   . gabapentin (NEURONTIN) 400 MG capsule   Oral   Take 400 mg by mouth 3 (three) times daily.         Marland Kitchen lisinopril  (PRINIVIL,ZESTRIL) 20 MG tablet   Oral   Take 20 mg by mouth daily.          . methocarbamol (ROBAXIN) 500 MG tablet   Oral   Take 1 tablet (500 mg total) by mouth every 6 (six) hours as needed for muscle spasms.   120 tablet   1   . oxyCODONE-acetaminophen (ROXICET) 5-325 MG tablet   Oral   Take 1 tablet by mouth every 4 (four) hours as needed for severe pain.   60 tablet   0   . senna (SENOKOT) 8.6 MG TABS tablet   Oral   Take 2 tablets (17.2 mg total) by mouth 2 (two) times daily.   120 each   0     Allergies Tramadol  No family history on file.  Social History Social History  Substance Use Topics  . Smoking status: Former Research scientist (life sciences)  . Smokeless tobacco: Never Used  . Alcohol Use: No    Review of Systems  Constitutional: Negative for fever. Eyes: Negative for visual changes. ENT: Negative for sore throat. Cardiovascular: Negative for chest pain. Respiratory: Negative for shortness of breath. Gastrointestinal: Positive for left lower quadrant abdominal pain Genitourinary: Negative for dysuria. Musculoskeletal: Negative for back pain. Skin: Negative for rash. Neurological: Negative for headaches, focal weakness or numbness.   10-point ROS otherwise negative.  ____________________________________________   PHYSICAL EXAM:  VITAL SIGNS: ED Triage Vitals  Enc Vitals Group     BP 01/28/16 0409 190/83 mmHg     Pulse Rate 01/28/16 0409 58     Resp 01/28/16 0409 18     Temp 01/28/16 0409 98.3 F (36.8 C)     Temp Source 01/28/16 0409 Oral     SpO2 01/28/16 0409 96 %     Weight 01/28/16 0408 135 lb (61.236 kg)     Height 01/28/16 0408 '5\' 2"'$  (1.575 m)     Head Cir --      Peak Flow --      Pain Score 01/28/16 0408 8     Pain Loc --      Pain Edu? --      Excl. in [place]? --      Constitutional: Alert and oriented. Well appearing and in no distress. Eyes: Conjunctivae are normal. PERRL. Normal extraocular movements. ENT   Head: Normocephalic and  atraumatic.   Nose: No congestion/rhinnorhea.   Mouth/Throat: Mucous membranes are moist.   Neck: No stridor. Hematological/Lymphatic/Immunilogical: No cervical lymphadenopathy. Cardiovascular: Normal rate, regular rhythm. Normal and symmetric distal pulses are present in all extremities. No murmurs, rubs, or gallops. Respiratory: Normal respiratory effort without tachypnea nor retractions. Breath sounds are clear and equal bilaterally. No wheezes/rales/rhonchi. Gastrointestinal: Left lower quadrant tenderness to palpation No distention. There is no CVA tenderness. Genitourinary: deferred Musculoskeletal: Nontender with normal range of motion in all extremities. No joint effusions.  No lower extremity tenderness nor edema. Neurologic:  Normal speech and language. No gross focal neurologic deficits are appreciated. Speech is normal.  Skin:  Skin is warm, dry and intact. No rash noted. Psychiatric: Mood and affect are normal. Speech and behavior are normal. Patient exhibits appropriate insight and judgment.  ____________________________________________    LABS (pertinent positives/negatives)        INITIAL IMPRESSION / ASSESSMENT AND PLAN / ED COURSE  Pertinent labs & imaging results that were available during my care of the patient were reviewed by me and considered in my medical decision making (see chart for details).  Given since CT scan of the abdomen and pelvis which revealed diverticulosis and patient's presenting complaints at this time high suspicion for diverticulitis. Patient afebrile temperature 98.3 presentation to the emergency department no vomiting diarrhea melena or bright red blood per rectum. As such patient will not undergo another CT scan today given increased radiation. We'll treat patient expectantly  ____________________________________________   FINAL CLINICAL IMPRESSION(S) / ED DIAGNOSES  Final diagnoses:  Diverticulitis of large intestine  without perforation or abscess without bleeding      Gregor Hams, MD 01/28/16 [id number]

## 2016-01-28 NOTE — ED Notes (Signed)

## 2016-01-28 NOTE — ED Provider Notes (Signed)
-----------------------------------------   6:06 AM on 01/28/2016 -----------------------------------------  Labs have resulted within normal limits. We'll discharge patient home per Dr. Saul Fordyce discharge instructions with Cipro and Flagyl for possible diverticulitis. Patient follow-up with PCP.  Harvest Dark, MD 01/28/16 [id number]

## 2016-02-07 ENCOUNTER — Emergency Department: Payer: Medicare Other

## 2016-02-07 ENCOUNTER — Emergency Department
Admission: EM | Admit: 2016-02-07 | Discharge: 2016-02-08 | Disposition: A | Discharge status: Home / Self Care | Payer: Medicare Other | Attending: Emergency Medicine | Admitting: Emergency Medicine

## 2016-02-07 DIAGNOSIS — K5900 Constipation, unspecified: Secondary | ICD-10-CM | POA: Diagnosis not present

## 2016-02-07 DIAGNOSIS — M5440 Lumbago with sciatica, unspecified side: Secondary | ICD-10-CM | POA: Diagnosis not present

## 2016-02-07 DIAGNOSIS — E876 Hypokalemia: Secondary | ICD-10-CM

## 2016-02-07 DIAGNOSIS — R1032 Left lower quadrant pain: Secondary | ICD-10-CM | POA: Diagnosis present

## 2016-02-07 DIAGNOSIS — M1990 Unspecified osteoarthritis, unspecified site: Secondary | ICD-10-CM | POA: Insufficient documentation

## 2016-02-07 DIAGNOSIS — E119 Type 2 diabetes mellitus without complications: Secondary | ICD-10-CM | POA: Diagnosis not present

## 2016-02-07 DIAGNOSIS — Z87891 Personal history of nicotine dependence: Secondary | ICD-10-CM | POA: Diagnosis not present

## 2016-02-07 DIAGNOSIS — R1030 Lower abdominal pain, unspecified: Secondary | ICD-10-CM

## 2016-02-07 DIAGNOSIS — I10 Essential (primary) hypertension: Secondary | ICD-10-CM | POA: Insufficient documentation

## 2016-02-07 LAB — COMPREHENSIVE METABOLIC PANEL
ALK PHOS: 71 U/L (ref 38–126)
ALT: 10 U/L — AB (ref 14–54)
ANION GAP: 8 (ref 5–15)
AST: 17 U/L (ref 15–41)
Albumin: 4.1 g/dL (ref 3.5–5.0)
BILIRUBIN TOTAL: 0.7 mg/dL (ref 0.3–1.2)
BUN: 16 mg/dL (ref 6–20)
CALCIUM: 9.5 mg/dL (ref 8.9–10.3)
CO2: 29 mmol/L (ref 22–32)
CREATININE: 0.75 mg/dL (ref 0.44–1.00)
Chloride: 104 mmol/L (ref 101–111)
Glucose, Bld: 173 mg/dL — ABNORMAL HIGH (ref 65–99)
Potassium: 2.8 mmol/L — CL (ref 3.5–5.1)
Sodium: 141 mmol/L (ref 135–145)
TOTAL PROTEIN: 7.5 g/dL (ref 6.5–8.1)

## 2016-02-07 LAB — CBC WITH DIFFERENTIAL/PLATELET
Basophils Absolute: 0 10*3/uL (ref 0–0.1)
Basophils Relative: 0 %
EOS ABS: 0.1 10*3/uL (ref 0–0.7)
Eosinophils Relative: 2 %
HEMATOCRIT: 40 % (ref 35.0–47.0)
HEMOGLOBIN: 13.7 g/dL (ref 12.0–16.0)
LYMPHS ABS: 1.9 10*3/uL (ref 1.0–3.6)
MCH: 29 pg (ref 26.0–34.0)
MCHC: 34.2 g/dL (ref 32.0–36.0)
MCV: 84.6 fL (ref 80.0–100.0)
Monocytes Absolute: 1.3 10*3/uL — ABNORMAL HIGH (ref 0.2–0.9)
Neutro Abs: 5.4 10*3/uL (ref 1.4–6.5)
Platelets: 277 10*3/uL (ref 150–440)
RBC: 4.73 MIL/uL (ref 3.80–5.20)
RDW: 14.1 % (ref 11.5–14.5)
WBC: 8.8 10*3/uL (ref 3.6–11.0)

## 2016-02-07 LAB — LIPASE, BLOOD: LIPASE: 24 U/L (ref 11–51)

## 2016-02-07 LAB — URINALYSIS COMPLETE WITH MICROSCOPIC (ARMC ONLY)
Bacteria, UA: NONE SEEN
Bilirubin Urine: NEGATIVE
Hgb urine dipstick: NEGATIVE
Leukocytes, UA: NEGATIVE
Nitrite: NEGATIVE
Protein, ur: NEGATIVE mg/dL
SPECIFIC GRAVITY, URINE: 1.029 (ref 1.005–1.030)
pH: 5 (ref 5.0–8.0)

## 2016-02-07 LAB — TROPONIN I

## 2016-02-07 MED ORDER — DIATRIZOATE MEGLUMINE & SODIUM 66-10 % PO SOLN: 15.0000 mL | Freq: Once | ORAL | Status: DC

## 2016-02-07 MED ORDER — ONDANSETRON HCL 4 MG/2ML IJ SOLN
4.0000 mg | Freq: Once | INTRAMUSCULAR | Status: AC
  Administered 2016-02-07: 4 mg via INTRAVENOUS
  Filled 2016-02-07: qty 2

## 2016-02-07 MED ORDER — MORPHINE SULFATE (PF) 4 MG/ML IV SOLN
4.0000 mg | Freq: Once | INTRAVENOUS | Status: AC
  Administered 2016-02-07: 4 mg via INTRAVENOUS
  Filled 2016-02-07: qty 1

## 2016-02-07 MED ORDER — SODIUM CHLORIDE 0.9 % IV BOLUS (SEPSIS)
1000.0000 mL | Freq: Once | INTRAVENOUS | Status: AC
  Administered 2016-02-07: 1000 mL via INTRAVENOUS

## 2016-02-07 NOTE — ED Provider Notes (Signed)
[facility] Emergency Department Provider Note   ____________________________________________  Time seen: Approximately 5465 PM  I have reviewed the triage vital signs and the nursing notes.   HISTORY  Chief Complaint Back Pain and Abdominal Pain   HPI Ruth Rhodes is a 82 y.o. female with a history of hypertension as well as diabetes who is presenting to the emergency department today with left lower quadrant abdominal pain as well as lumbar back pain which is radiating down her bilateral legs. She had back surgery this past January and says she has had pain ever since. She also has weakness to the left lower extremity since the surgery. She is out of her oxycodone for about 1 month at this time. She is also had abdominal pain the left lower quadrant for about 1 month. Denies any diarrhea, nausea or vomiting. Says the pain is cramping and severe. He has had 2 evaluations for this pain in this emergency department including a negative CAT scan for any acute process in late April. She is since been placed on Cipro and Flagyl but still is persistent symptoms. She says the back pain is also cramping and severe. Worsens with movement. Denies any loss of bowel or bladder continence.   Past Medical History  Diagnosis Date  . Hypertension   . Arthritis   . Diabetes mellitus without complication ([place])   . Back pain     Patient Active Problem List   Diagnosis Date Noted  . Lumbar stenosis with neurogenic claudication 09/23/2015    Past Surgical History  Procedure Laterality Date  . Abdominal hysterectomy    . Lumbar laminectomy with coflex 2 level Bilateral 09/23/2015    Procedure: Laminectomy and Foraminotomy L4-L5 bilateral with CoFlex L5-S1 - left;  Surgeon: Karie Chimera, MD;  Location: [facility];  Service: Neurosurgery;  Laterality: Bilateral;  . Wound exploration N/A 09/27/2015    Procedure: WOUND EXPLORATION;  Surgeon: Kevan Ny Ditty, MD;   Location: [facility];  Service: Neurosurgery;  Laterality: N/A;  WOUND EXPLORATION    Current Outpatient Rx  Name  Route  Sig  Dispense  Refill  . BYSTOLIC 20 MG TABS   Oral   Take 20 mg by mouth daily.            Dispense as written.   . docusate sodium (COLACE) 100 MG capsule   Oral   Take 2 capsules (200 mg total) by mouth 2 (two) times daily.   120 capsule   0   . gabapentin (NEURONTIN) 400 MG capsule   Oral   Take 400 mg by mouth 3 (three) times daily.         Marland Kitchen lisinopril (PRINIVIL,ZESTRIL) 20 MG tablet   Oral   Take 20 mg by mouth daily.          . methocarbamol (ROBAXIN) 500 MG tablet   Oral   Take 1 tablet (500 mg total) by mouth every 6 (six) hours as needed for muscle spasms.   120 tablet   1   . oxyCODONE-acetaminophen (ROXICET) 5-325 MG tablet   Oral   Take 1 tablet by mouth every 4 (four) hours as needed for severe pain.   60 tablet   0   . senna (SENOKOT) 8.6 MG TABS tablet   Oral   Take 2 tablets (17.2 mg total) by mouth 2 (two) times daily.   120 each   0     Allergies Tramadol  History reviewed. No  pertinent family history.  Social History Social History  Substance Use Topics  . Smoking status: Former Research scientist (life sciences)  . Smokeless tobacco: Never Used  . Alcohol Use: No    Review of Systems Constitutional: No fever/chills Eyes: No visual changes. ENT: No sore throat. Cardiovascular: Denies chest pain. Respiratory: Denies shortness of breath. Gastrointestinal:  No nausea, no vomiting.  No diarrhea.  No constipation. Genitourinary: Negative for dysuria. Musculoskeletal: As above Skin: Negative for rash. Neurological: Negative for headaches, focal weakness or numbness.  10-point ROS otherwise negative.  ____________________________________________   PHYSICAL EXAM:  VITAL SIGNS: ED Triage Vitals  Enc Vitals Group     BP 02/07/16 2117 156/80 mmHg     Pulse Rate 02/07/16 2117 84     Resp 02/07/16 2117 25     Temp 02/07/16  2117 98.4 F (36.9 C)     Temp Source 02/07/16 2117 Oral     SpO2 02/07/16 2117 96 %     Weight --      Height --      Head Cir --      Peak Flow --      Pain Score 02/07/16 2117 10     Pain Loc --      Pain Edu? --      Excl. in [place]? --     Constitutional: Alert and oriented. No acute distress. Eyes: Conjunctivae are normal. PERRL. EOMI. Head: Atraumatic. Nose: No congestion/rhinnorhea. Mouth/Throat: Mucous membranes are moist.   Neck: No stridor.   Cardiovascular: Normal rate, regular rhythm. Grossly normal heart sounds.  Good peripheral circulation. Respiratory: Normal respiratory effort.  No retractions. Lungs CTAB. Gastrointestinal: Soft with diffuse tenderness which is worse to the lower abdomen especially in the left lower quadrant. No rebound or guarding. No distention.  Musculoskeletal: No lower extremity tenderness nor edema.  No joint effusions. Tenderness to palpation to the low lumbar area to the midline. No deformity or step-off. Well healed surgical scar at this location. Neurologic:  Normal speech and language. 3 out of 5 strength to the left lower extremity with a 4 out of 5 strength to the right lower extremity which the patient says is her baseline. No saddle anesthesia. Skin:  Skin is warm, dry and intact. No rash noted. Psychiatric: Mood and affect are normal. Speech and behavior are normal.  ____________________________________________   LABS (all labs ordered are listed, but only abnormal results are displayed)  Labs Reviewed  CBC WITH DIFFERENTIAL/PLATELET - Abnormal; Notable for the following:    Monocytes Absolute 1.3 (*)    All other components within normal limits  COMPREHENSIVE METABOLIC PANEL - Abnormal; Notable for the following:    Potassium 2.8 (*)    Glucose, Bld 173 (*)    ALT 10 (*)    All other components within normal limits  URINALYSIS COMPLETEWITH MICROSCOPIC ([facility] ONLY) - Abnormal; Notable for the following:    Color, Urine YELLOW  (*)    APPearance CLEAR (*)    Glucose, UA >500 (*)    Ketones, ur TRACE (*)    Squamous Epithelial / LPF 0-5 (*)    All other components within normal limits  LIPASE, BLOOD  TROPONIN I   ____________________________________________  EKG  ED ECG REPORT I, Doran Stabler, the attending physician, personally viewed and interpreted this ECG.   Date: 02/07/2016  EKG Time: 2116  Rate: 85  Rhythm: normal sinus rhythm  Axis: Left axis  Intervals:none  ST&T Change: No ST elevations or depressions.  T-wave inversions in 1, aVL as well as V2. No significant change from EKG done on April 24. Similar T-wave morphologies. ____________________________________________  RADIOLOGY   ____________________________________________   PROCEDURES    ____________________________________________   INITIAL IMPRESSION / ASSESSMENT AND PLAN / ED COURSE  Pertinent labs & imaging results that were available during my care of the patient were reviewed by me and considered in my medical decision making (see chart for details).  ----------------------------------------- 12:22 AM on 02/08/2016 -----------------------------------------  Pending CAT scan at this time. Signed out to Dr. Karma Greaser ____________________________________________   FINAL CLINICAL IMPRESSION(S) / ED DIAGNOSES  Final diagnoses:  Lower abdominal pain  Hypokalemia      NEW MEDICATIONS STARTED DURING THIS VISIT:  New Prescriptions   No medications on file     Note:  This document was prepared using Dragon voice recognition software and may include unintentional dictation errors.    Orbie Pyo, MD 02/08/16 [phone]

## 2016-02-07 NOTE — ED Notes (Signed)
Dr. Schaevitz at bedside.  

## 2016-02-07 NOTE — ED Notes (Signed)
Pt finished drinking contrast. CT notified. Stated they are awaiting lab results and then pt will have scan done.

## 2016-02-07 NOTE — ED Notes (Signed)
Pt presents to ED via [facility] c/o of lower back pain that is shooting down both legs. Pt is also c/o of abdominal pain. Pt states she normally takes oxycodone for pain medicine but is out. Pt states pain began this morning. Pt had back surgery in January.

## 2016-02-08 ENCOUNTER — Encounter: Payer: Self-pay | Admitting: Radiology

## 2016-02-08 MED ORDER — POTASSIUM CHLORIDE CRYS ER 20 MEQ PO TBCR: 20.0000 meq | EXTENDED_RELEASE_TABLET | Freq: Every day | ORAL | Status: AC

## 2016-02-08 MED ORDER — IOPAMIDOL (ISOVUE-300) INJECTION 61%
100.0000 mL | Freq: Once | INTRAVENOUS | Status: AC | PRN
  Administered 2016-02-08: 100 mL via INTRAVENOUS

## 2016-02-08 MED ORDER — POTASSIUM CHLORIDE CRYS ER 20 MEQ PO TBCR
40.0000 meq | EXTENDED_RELEASE_TABLET | Freq: Once | ORAL | Status: AC
  Administered 2016-02-08: 40 meq via ORAL
  Filled 2016-02-08: qty 2

## 2016-02-08 NOTE — ED Notes (Signed)
Pt transported to CT via stretcher.  

## 2016-02-08 NOTE — ED Notes (Signed)
Pt returned from CT via stretcher.

## 2016-02-08 NOTE — Discharge Instructions (Signed)
You have been seen in the Emergency Department (ED) for abdominal pain.  Your evaluation did not identify a clear cause of your symptoms but was generally reassuring.  We did see on the CT scan that you have quite a bit of stool in your bowels (constipation), and your potassium was a bit low today.  Low potassium can actually cause constipation, so we gave you a dose of potassium supplement before going home and a prescription to take potassium supplements for the next week.    In addition to potassium-rich foods and supplements, we recommend that you use one or more of the following over-the-counter medications in the order described:   1)  Colace (or Dulcolax) 100 mg:  This is a stool softener, and you may take it once or twice a day as needed. 2)  Senna tablets:  This is a bowel stimulant that will help "push" out your stool. It is the next step to add after you have tried a stool softener. 3)  Miralax (powder):  This medication works by drawing additional fluid into your intestines and helps to flush out your stool.  Mix the powder with water or juice according to label instructions.  It may help if the Colace and Senna are not sufficient, but you must be sure to use the recommended amount of water or juice when you mix up the powder. Remember that narcotic pain medications are constipating, so avoid them or minimize their use.  Drink plenty of fluids.  Please follow up as instructed above regarding todays emergent visit and the symptoms that are bothering you, and please bring these papers with you so your doctor can see what we discussed in the paragraphs above.  Return to the ED if your abdominal pain worsens or fails to improve, you develop bloody vomiting, bloody diarrhea, you are unable to tolerate fluids due to vomiting, fever greater than 101, or other symptoms that concern you.   Abdominal Pain, Adult Many things can cause abdominal pain. Usually, abdominal pain is not caused by a  disease and will improve without treatment. It can often be observed and treated at home. Your health care provider will do a physical exam and possibly order blood tests and X-rays to help determine the seriousness of your pain. However, in many cases, more time must pass before a clear cause of the pain can be found. Before that point, your health care provider may not know if you need more testing or further treatment. HOME CARE INSTRUCTIONS Monitor your abdominal pain for any changes. The following actions may help to alleviate any discomfort you are experiencing:  Only take over-the-counter or prescription medicines as directed by your health care provider.  Do not take laxatives unless directed to do so by your health care provider.  Try a clear liquid diet (broth, tea, or water) as directed by your health care provider. Slowly move to a bland diet as tolerated. SEEK MEDICAL CARE IF:  You have unexplained abdominal pain.  You have abdominal pain associated with nausea or diarrhea.  You have pain when you urinate or have a bowel movement.  You experience abdominal pain that wakes you in the night.  You have abdominal pain that is worsened or improved by eating food.  You have abdominal pain that is worsened with eating fatty foods.  You have a fever. SEEK IMMEDIATE MEDICAL CARE IF:  Your pain does not go away within 2 hours.  You keep throwing up (vomiting).  Your pain  is felt only in portions of the abdomen, such as the right side or the left lower portion of the abdomen.  You pass bloody or black tarry stools. MAKE SURE YOU:  Understand these instructions.  Will watch your condition.  Will get help right away if you are not doing well or get worse.   This information is not intended to replace advice given to you by your health care provider. Make sure you discuss any questions you have with your health care provider.   Document Released: 06/14/2005 Document Revised:  05/26/2015 Document Reviewed: 05/14/2013 Elsevier Interactive Patient Education 2016 [employer].  Constipation, Adult Constipation is when a person has fewer than three bowel movements a week, has difficulty having a bowel movement, or has stools that are dry, hard, or larger than normal. As people grow older, constipation is more common. A low-fiber diet, not taking in enough fluids, and taking certain medicines may make constipation worse.  CAUSES   Certain medicines, such as antidepressants, pain medicine, iron supplements, antacids, and water pills.   Certain diseases, such as diabetes, irritable bowel syndrome (IBS), thyroid disease, or depression.   Not drinking enough water.   Not eating enough fiber-rich foods.   Stress or travel.   Lack of physical activity or exercise.   Ignoring the urge to have a bowel movement.   Using laxatives too much.  SIGNS AND SYMPTOMS   Having fewer than three bowel movements a week.   Straining to have a bowel movement.   Having stools that are hard, dry, or larger than normal.   Feeling full or bloated.   Pain in the lower abdomen.   Not feeling relief after having a bowel movement.  DIAGNOSIS  Your health care provider will take a medical history and perform a physical exam. Further testing may be done for severe constipation. Some tests may include:  A barium enema X-ray to examine your rectum, colon, and, sometimes, your small intestine.   A sigmoidoscopy to examine your lower colon.   A colonoscopy to examine your entire colon. TREATMENT  Treatment will depend on the severity of your constipation and what is causing it. Some dietary treatments include drinking more fluids and eating more fiber-rich foods. Lifestyle treatments may include regular exercise. If these diet and lifestyle recommendations do not help, your health care provider may recommend taking over-the-counter laxative medicines to help you have  bowel movements. Prescription medicines may be prescribed if over-the-counter medicines do not work.  HOME CARE INSTRUCTIONS   Eat foods that have a lot of fiber, such as fruits, vegetables, whole grains, and beans.  Limit foods high in fat and processed sugars, such as french fries, hamburgers, cookies, candies, and soda.   A fiber supplement may be added to your diet if you cannot get enough fiber from foods.   Drink enough fluids to keep your urine clear or pale yellow.   Exercise regularly or as directed by your health care provider.   Go to the restroom when you have the urge to go. Do not hold it.   Only take over-the-counter or prescription medicines as directed by your health care provider. Do not take other medicines for constipation without talking to your health care provider first.  [place] IF:   You have bright red blood in your stool.   Your constipation lasts for more than 4 days or gets worse.   You have abdominal or rectal pain.   You have thin,  pencil-like stools.   You have unexplained weight loss. MAKE SURE YOU:   Understand these instructions.  Will watch your condition.  Will get help right away if you are not doing well or get worse.   This information is not intended to replace advice given to you by your health care provider. Make sure you discuss any questions you have with your health care provider.   Document Released: 06/02/2004 Document Revised: 09/25/2014 Document Reviewed: 06/16/2013 Elsevier Interactive Patient Education 2016 [employer].  Hypokalemia Hypokalemia means that the amount of potassium in the blood is lower than normal.Potassium is a chemical, called an electrolyte, that helps regulate the amount of fluid in the body. It also stimulates muscle contraction and helps nerves function properly.Most of the body's potassium is inside of cells, and only a very small amount is in the blood. Because the  amount in the blood is so small, minor changes can be life-threatening. CAUSES  Antibiotics.  Diarrhea or vomiting.  Using laxatives too much, which can cause diarrhea.  Chronic kidney disease.  Water pills (diuretics).  Eating disorders (bulimia).  Low magnesium level.  Sweating a lot. SIGNS AND SYMPTOMS  Weakness.  Constipation.  Fatigue.  Muscle cramps.  Mental confusion.  Skipped heartbeats or irregular heartbeat (palpitations).  Tingling or numbness. DIAGNOSIS  Your health care provider can diagnose hypokalemia with blood tests. In addition to checking your potassium level, your health care provider may also check other lab tests. TREATMENT Hypokalemia can be treated with potassium supplements taken by mouth or adjustments in your current medicines. If your potassium level is very low, you may need to get potassium through a vein (IV) and be monitored in the hospital. A diet high in potassium is also helpful. Foods high in potassium are:  Nuts, such as peanuts and pistachios.  Seeds, such as sunflower seeds and pumpkin seeds.  Peas, lentils, and lima beans.  Whole grain and bran cereals and breads.  Fresh fruit and vegetables, such as apricots, avocado, bananas, cantaloupe, kiwi, oranges, tomatoes, asparagus, and potatoes.  Orange and tomato juices.  Red meats.  Fruit yogurt. HOME CARE INSTRUCTIONS  Take all medicines as prescribed by your health care provider.  Maintain a healthy diet by including nutritious food, such as fruits, vegetables, nuts, whole grains, and lean meats.  If you are taking a laxative, be sure to follow the directions on the label. SEEK MEDICAL CARE IF:  Your weakness gets worse.  You feel your heart pounding or racing.  You are vomiting or having diarrhea.  You are diabetic and having trouble keeping your blood glucose in the normal range. SEEK IMMEDIATE MEDICAL CARE IF:  You have chest pain, shortness of breath, or  dizziness.  You are vomiting or having diarrhea for more than 2 days.  You faint. MAKE SURE YOU:   Understand these instructions.  Will watch your condition.  Will get help right away if you are not doing well or get worse.   This information is not intended to replace advice given to you by your health care provider. Make sure you discuss any questions you have with your health care provider.   Document Released: 09/04/2005 Document Revised: 09/25/2014 Document Reviewed: 03/07/2013 Elsevier Interactive Patient Education 2016 [place].  Potassium Content of Foods Potassium is a mineral found in many foods and drinks. It helps keep fluids and minerals balanced in your body and affects how steadily your heart beats. Potassium also helps control your blood pressure and keep  your muscles and nervous system healthy. Certain health conditions and medicines may change the balance of potassium in your body. When this happens, you can help balance your level of potassium through the foods that you do or do not eat. Your health care provider or dietitian may recommend an amount of potassium that you should have each day. The following lists of foods provide the amount of potassium (in parentheses) per serving in each item. HIGH IN POTASSIUM  The following foods and beverages have 200 mg or more of potassium per serving:  Apricots, 2 raw or 5 dry (200 mg).  Artichoke, 1 medium (345 mg).  Avocado, raw,  each (245 mg).  Banana, 1 medium (425 mg).  Beans, lima, or baked beans, canned,  cup (280 mg).  Beans, white, canned,  cup (595 mg).  Beef roast, 3 oz (320 mg).  Beef, ground, 3 oz (270 mg).  Beets, raw or cooked,  cup (260 mg).  Bran muffin, 2 oz (300 mg).  Broccoli,  cup (230 mg).  Brussels sprouts,  cup (250 mg).  Cantaloupe,  cup (215 mg).  Cereal, 100% bran,  cup (200-400 mg).  Cheeseburger, single, fast food, 1 each (225-400 mg).  Chicken, 3 oz (220  mg).  Clams, canned, 3 oz (535 mg).  Crab, 3 oz (225 mg).  Dates, 5 each (270 mg).  Dried beans and peas,  cup (300-475 mg).  Figs, dried, 2 each (260 mg).  Fish: halibut, tuna, cod, snapper, 3 oz (480 mg).  Fish: salmon, haddock, swordfish, perch, 3 oz (300 mg).  Fish, tuna, canned 3 oz (200 mg).  [place] fries, fast food, 3 oz (470 mg).  Granola with fruit and nuts,  cup (200 mg).  Grapefruit juice,  cup (200 mg).  Greens, beet,  cup (655 mg).  Honeydew melon,  cup (200 mg).  Kale, raw, 1 cup (300 mg).  Kiwi, 1 medium (240 mg).  Kohlrabi, rutabaga, parsnips,  cup (280 mg).  Lentils,  cup (365 mg).  Mango, 1 each (325 mg).  Milk, chocolate, 1 cup (420 mg).  Milk: nonfat, low-fat, whole, buttermilk, 1 cup (350-380 mg).  Molasses, 1 Tbsp (295 mg).  Mushrooms,  cup (280) mg.  Nectarine, 1 each (275 mg).  Nuts: almonds, peanuts, hazelnuts, [place], cashew, mixed, 1 oz (200 mg).  Nuts, pistachios, 1 oz (295 mg).  Orange, 1 each (240 mg).  Orange juice,  cup (235 mg).  Papaya, medium,  fruit (390 mg).  Peanut butter, chunky, 2 Tbsp (240 mg).  Peanut butter, smooth, 2 Tbsp (210 mg).  Pear, 1 medium (200 mg).  Pomegranate, 1 whole (400 mg).  Pomegranate juice,  cup (215 mg).  Pork, 3 oz (350 mg).  Potato chips, salted, 1 oz (465 mg).  Potato, baked with skin, 1 medium (925 mg).  Potatoes, boiled,  cup (255 mg).  Potatoes, mashed,  cup (330 mg).  Prune juice,  cup (370 mg).  Prunes, 5 each (305 mg).  Pudding, chocolate,  cup (230 mg).  Pumpkin, canned,  cup (250 mg).  Raisins, seedless,  cup (270 mg).  Seeds, sunflower or pumpkin, 1 oz (240 mg).  Soy milk, 1 cup (300 mg).  Spinach,  cup (420 mg).  Spinach, canned,  cup (370 mg).  Sweet potato, baked with skin, 1 medium (450 mg).  Swiss chard,  cup (480 mg).  Tomato or vegetable juice,  cup (275 mg).  Tomato sauce or puree,  cup (400-550 mg).  Tomato,  raw, 1 medium (  290 mg).  Tomatoes, canned,  cup (200-300 mg).  [place], 3 oz (250 mg).  Wheat germ, 1 oz (250 mg).  Winter squash,  cup (250 mg).  Yogurt, plain or fruited, 6 oz (260-435 mg).  Zucchini,  cup (220 mg). MODERATE IN POTASSIUM The following foods and beverages have 50-200 mg of potassium per serving:  Apple, 1 each (150 mg).  Apple juice,  cup (150 mg).  Applesauce,  cup (90 mg).  Apricot nectar,  cup (140 mg).  Asparagus, small spears,  cup or 6 spears (155 mg).  Bagel, cinnamon raisin, 1 each (130 mg).  Bagel, egg or plain, 4 in., 1 each (70 mg).  Beans, green,  cup (90 mg).  Beans, yellow,  cup (190 mg).  Beer, regular, 12 oz (100 mg).  Beets, canned,  cup (125 mg).  Blackberries,  cup (115 mg).  Blueberries,  cup (60 mg).  Bread, whole wheat, 1 slice (70 mg).  Broccoli, raw,  cup (145 mg).  Cabbage,  cup (150 mg).  Carrots, cooked or raw,  cup (180 mg).  Cauliflower, raw,  cup (150 mg).  Celery, raw,  cup (155 mg).  Cereal, bran flakes, cup (120-150 mg).  Cheese, cottage,  cup (110 mg).  Cherries, 10 each (150 mg).  Chocolate, 1 oz bar (165 mg).  Coffee, brewed 6 oz (90 mg).  Corn,  cup or 1 ear (195 mg).  Cucumbers,  cup (80 mg).  Egg, large, 1 each (60 mg).  Eggplant,  cup (60 mg).  Endive, raw, cup (80 mg).  English muffin, 1 each (65 mg).  Fish, orange roughy, 3 oz (150 mg).  Frankfurter, beef or pork, 1 each (75 mg).  Fruit cocktail,  cup (115 mg).  Grape juice,  cup (170 mg).  Grapefruit,  fruit (175 mg).  Grapes,  cup (155 mg).  Greens: kale, turnip, collard,  cup (110-150 mg).  Ice cream or frozen yogurt, chocolate,  cup (175 mg).  Ice cream or frozen yogurt, vanilla,  cup (120-150 mg).  Lemons, limes, 1 each (80 mg).  Lettuce, all types, 1 cup (100 mg).  Mixed vegetables,  cup (150 mg).  Mushrooms, raw,  cup (110 mg).  Nuts: walnuts, pecans, or macadamia, 1  oz (125 mg).  Oatmeal,  cup (80 mg).  Okra,  cup (110 mg).  Onions, raw,  cup (120 mg).  Peach, 1 each (185 mg).  Peaches, canned,  cup (120 mg).  Pears, canned,  cup (120 mg).  Peas, green, frozen,  cup (90 mg).  Peppers, green,  cup (130 mg).  Peppers, red,  cup (160 mg).  Pineapple juice,  cup (165 mg).  Pineapple, fresh or canned,  cup (100 mg).  Plums, 1 each (105 mg).  Pudding, vanilla,  cup (150 mg).  Raspberries,  cup (90 mg).  Rhubarb,  cup (115 mg).  Rice, wild,  cup (80 mg).  Shrimp, 3 oz (155 mg).  Spinach, raw, 1 cup (170 mg).  Strawberries,  cup (125 mg).  Summer squash  cup (175-200 mg).  Swiss chard, raw, 1 cup (135 mg).  Tangerines, 1 each (140 mg).  Tea, brewed, 6 oz (65 mg).  Turnips,  cup (140 mg).  Watermelon,  cup (85 mg).  Wine, red, table, 5 oz (180 mg).  Wine, white, table, 5 oz (100 mg). LOW IN POTASSIUM The following foods and beverages have less than 50 mg of potassium per serving.  Bread, white, 1 slice (30 mg).  Carbonated beverages, 12  oz (less than 5 mg).  Cheese, 1 oz (20-30 mg).  Cranberries,  cup (45 mg).  Cranberry juice cocktail,  cup (20 mg).  Fats and oils, 1 Tbsp (less than 5 mg).  Hummus, 1 Tbsp (32 mg).  Nectar: papaya, mango, or pear,  cup (35 mg).  Rice, white or brown,  cup (50 mg).  Spaghetti or macaroni,  cup cooked (30 mg).  Tortilla, flour or corn, 1 each (50 mg).  Waffle, 4 in., 1 each (50 mg).  Water chestnuts,  cup (40 mg).   This information is not intended to replace advice given to you by your health care provider. Make sure you discuss any questions you have with your health care provider.   Document Released: 04/18/2005 Document Revised: 09/09/2013 Document Reviewed: 08/01/2013 Elsevier Interactive Patient Education [employer].

## 2016-02-08 NOTE — ED Provider Notes (Signed)
-----------------------------------------   12:01 AM on 02/08/2016 -----------------------------------------   Blood pressure 180/82, pulse 34, temperature 98.4 F (36.9 C), temperature source Oral, resp. rate 16, SpO2 98 %.  Assuming care from Dr. Clearnce Hasten.  In short, Ruth Rhodes is a 82 y.o. female with a chief complaint of Back Pain and Abdominal Pain .  Refer to the original H&P for additional details.  The current plan of care is to follow up abdominal imaging (CT abd/pelvis) and reassess.   ----------------------------------------- 2:18 AM on 02/08/2016 -----------------------------------------  CT scan unremarkable except for moderate stool burden.  Given her low potassium I suspect this is contributing to constipation.  I had a lengthy discussion with her and her about stool softeners, and we gave a potassium supplement and a prescription for supplements and a lot of written information about hypokalemia, constipation, abdominal pain, potassium content of foods, etc.  I gave my usual and customary return precautions.  Although she does have some probable sludge in the gallbladder, she has no evidence of cholecystitis, she has normal LFTs, and no upper abdominal pain or discomfort, it is all lower abdomen and both chronic and likely the result of constipation.  Ct Abdomen Pelvis W Contrast  02/08/2016  CLINICAL DATA:  Left lower quadrant pain, low back pain. Symptoms for 2 months. EXAM: CT ABDOMEN AND PELVIS WITH CONTRAST TECHNIQUE: Multidetector CT imaging of the abdomen and pelvis was performed using the standard protocol following bolus administration of intravenous contrast. CONTRAST:  134m ISOVUE-300 IOPAMIDOL (ISOVUE-300) INJECTION 61% COMPARISON:  CT 01/09/2016 FINDINGS: Lower chest: The included lung bases are clear. There are coronary artery calcifications. Liver: Stable hepatic cysts largest about the inferior aspect of the right lobe measure 1.8 cm. No new hepatic lesion.  Hepatobiliary: Sludge or stone within the gallbladder which is physiologically distended. No pericholecystic inflammation. Pancreas: No ductal dilatation or inflammation. Spleen: Normal. Adrenal glands: No nodule. Kidneys: Symmetric renal enhancement. No hydronephrosis. Parapelvic cysts in the left kidney measures 2 cm. Stomach/Bowel: Stomach physiologically distended. There are no dilated or thickened small bowel loops. Moderate diffuse stool burden without colonic wall thickening. Mild to moderate colonic diverticulosis of the distal colon without acute diverticulitis. The appendix is not confidently identified, no pericecal inflammation. Vascular/Lymphatic: No retroperitoneal adenopathy. Abdominal aorta is normal in caliber. Atherosclerosis of the abdominal aorta and its branches without aneurysm. Reproductive: Uterus is surgically absent.  No adnexal mass. Bladder: Physiologically distended without wall thickening. Other: No free air, free fluid, or intra-abdominal fluid collection. Small fat containing umbilical hernia. Musculoskeletal: There are no acute or suspicious osseous abnormalities. Degenerative and postsurgical change in the lower lumbar spine, stable from prior. Stable grade 1 anterolisthesis of L4 on L5. Prominent Schmorl's node superior endplate T12. IMPRESSION: 1. No acute abnormality in the abdomen/pelvis. Moderate diffuse stool burden, can be seen with constipation. 2. Probable sludge or stones within the gallbladder, no pericholecystic inflammation or findings to suggest acute cholecystitis. 3. Stable chronic findings include diverticulosis without diverticulitis, atherosclerosis, and hepatic cysts. Electronically Signed   By: MJeb LeveringM.D.   On: 02/08/2016 01:50     CHinda Kehr MD 02/08/16 [phone]

## 2016-02-08 NOTE — ED Notes (Signed)
Pt placed on bedpan with no difficulties.

## 2016-02-16 ENCOUNTER — Other Ambulatory Visit (HOSPITAL_COMMUNITY): Payer: Self-pay | Admitting: Neurosurgery

## 2016-02-17 ENCOUNTER — Other Ambulatory Visit: Payer: Self-pay | Admitting: Neurosurgery

## 2016-02-17 ENCOUNTER — Other Ambulatory Visit (HOSPITAL_COMMUNITY): Payer: Self-pay | Admitting: Neurosurgery

## 2016-02-17 DIAGNOSIS — M48061 Spinal stenosis, lumbar region without neurogenic claudication: Secondary | ICD-10-CM

## 2016-03-08 ENCOUNTER — Ambulatory Visit
Admission: RE | Admit: 2016-03-08 | Discharge: 2016-03-08 | Disposition: A | Discharge status: Home / Self Care | Payer: Medicare Other | Source: Ambulatory Visit | Attending: Neurosurgery | Admitting: Neurosurgery

## 2016-03-08 ENCOUNTER — Other Ambulatory Visit (HOSPITAL_COMMUNITY): Payer: Self-pay | Admitting: Neurosurgery

## 2016-03-08 DIAGNOSIS — M48061 Spinal stenosis, lumbar region without neurogenic claudication: Secondary | ICD-10-CM

## 2016-03-08 DIAGNOSIS — M4806 Spinal stenosis, lumbar region: Secondary | ICD-10-CM | POA: Insufficient documentation

## 2016-03-08 DIAGNOSIS — M9669 Fracture of other bone following insertion of orthopedic implant, joint prosthesis, or bone plate: Secondary | ICD-10-CM | POA: Insufficient documentation

## 2016-03-08 DIAGNOSIS — Z9889 Other specified postprocedural states: Secondary | ICD-10-CM | POA: Diagnosis not present

## 2016-07-13 ENCOUNTER — Other Ambulatory Visit: Payer: Self-pay | Admitting: Neurosurgery

## 2016-07-13 DIAGNOSIS — R29898 Other symptoms and signs involving the musculoskeletal system: Secondary | ICD-10-CM

## 2016-07-25 ENCOUNTER — Ambulatory Visit: Payer: Medicare Other

## 2016-08-11 ENCOUNTER — Emergency Department: Payer: Medicare Other

## 2016-08-11 ENCOUNTER — Encounter: Payer: Self-pay | Admitting: Emergency Medicine

## 2016-08-11 ENCOUNTER — Emergency Department
Admission: EM | Admit: 2016-08-11 | Discharge: 2016-08-11 | Disposition: A | Discharge status: Home / Self Care | Payer: Medicare Other | Attending: Emergency Medicine | Admitting: Emergency Medicine

## 2016-08-11 DIAGNOSIS — Z87891 Personal history of nicotine dependence: Secondary | ICD-10-CM | POA: Diagnosis not present

## 2016-08-11 DIAGNOSIS — S299XXA Unspecified injury of thorax, initial encounter: Secondary | ICD-10-CM | POA: Diagnosis present

## 2016-08-11 DIAGNOSIS — M546 Pain in thoracic spine: Secondary | ICD-10-CM | POA: Insufficient documentation

## 2016-08-11 DIAGNOSIS — I10 Essential (primary) hypertension: Secondary | ICD-10-CM | POA: Diagnosis not present

## 2016-08-11 DIAGNOSIS — Y999 Unspecified external cause status: Secondary | ICD-10-CM | POA: Insufficient documentation

## 2016-08-11 DIAGNOSIS — E119 Type 2 diabetes mellitus without complications: Secondary | ICD-10-CM | POA: Insufficient documentation

## 2016-08-11 DIAGNOSIS — C3411 Malignant neoplasm of upper lobe, right bronchus or lung: Secondary | ICD-10-CM | POA: Insufficient documentation

## 2016-08-11 DIAGNOSIS — Z79899 Other long term (current) drug therapy: Secondary | ICD-10-CM | POA: Diagnosis not present

## 2016-08-11 DIAGNOSIS — Y929 Unspecified place or not applicable: Secondary | ICD-10-CM | POA: Insufficient documentation

## 2016-08-11 DIAGNOSIS — Y939 Activity, unspecified: Secondary | ICD-10-CM | POA: Diagnosis not present

## 2016-08-11 DIAGNOSIS — R918 Other nonspecific abnormal finding of lung field: Secondary | ICD-10-CM | POA: Diagnosis not present

## 2016-08-11 DIAGNOSIS — W19XXXA Unspecified fall, initial encounter: Secondary | ICD-10-CM | POA: Insufficient documentation

## 2016-08-11 LAB — CBC
HCT: 44.4 % (ref 35.0–47.0)
Hemoglobin: 15.5 g/dL (ref 12.0–16.0)
MCH: 29.8 pg (ref 26.0–34.0)
MCHC: 35 g/dL (ref 32.0–36.0)
MCV: 85.3 fL (ref 80.0–100.0)
PLATELETS: 259 10*3/uL (ref 150–440)
RBC: 5.21 MIL/uL — ABNORMAL HIGH (ref 3.80–5.20)
RDW: 14.6 % — AB (ref 11.5–14.5)
WBC: 8.9 10*3/uL (ref 3.6–11.0)

## 2016-08-11 LAB — COMPREHENSIVE METABOLIC PANEL
ALBUMIN: 4.5 g/dL (ref 3.5–5.0)
ALT: 18 U/L (ref 14–54)
ANION GAP: 12 (ref 5–15)
AST: 33 U/L (ref 15–41)
Alkaline Phosphatase: 87 U/L (ref 38–126)
BILIRUBIN TOTAL: 1.3 mg/dL — AB (ref 0.3–1.2)
BUN: 9 mg/dL (ref 6–20)
CHLORIDE: 100 mmol/L — AB (ref 101–111)
CO2: 25 mmol/L (ref 22–32)
Calcium: 10.2 mg/dL (ref 8.9–10.3)
Creatinine, Ser: 0.48 mg/dL (ref 0.44–1.00)
GFR calc Af Amer: >60 mL/min (ref >60)
Glucose, Bld: 229 mg/dL — ABNORMAL HIGH (ref 65–99)
POTASSIUM: 3.6 mmol/L (ref 3.5–5.1)
Sodium: 137 mmol/L (ref 135–145)
TOTAL PROTEIN: 8 g/dL (ref 6.5–8.1)

## 2016-08-11 LAB — CK: CK TOTAL: 501 U/L — AB (ref 38–234)

## 2016-08-11 NOTE — ED Notes (Signed)
Pt. Verbalizes understanding of d/c instructions and follow-up. VS stable and pain controlled per pt.  Pt. In NAD at time of d/c and denies further concerns regarding this visit. Pt. Stable at the time of departure from the unit, departing unit by the safest and most appropriate manner per that pt condition and limitations. Pt advised to return to the ED at any time for emergent concerns, or for new/worsening symptoms.   

## 2016-08-11 NOTE — Discharge Instructions (Signed)
You have been seen tonight in the emergency department for back pain and a fall. Your workup as shown no traumatic findings. However as we discussed it does show a left upper lobe mass on your CT scan which needs further workup. Please call the number provided for oncology first thing Monday morning to arrange a follow-up appointment as soon as possible for further evaluation. Return to the emergency department for any symptom personally concerning to yourself.

## 2016-08-11 NOTE — ED Triage Notes (Signed)
Per [facility]: Neighbors called police to break in because not seen pt. In 2 days, found in floor post fall, c/o lumbar pain, hd surgery in January.

## 2016-08-11 NOTE — ED Provider Notes (Signed)
[facility] Emergency Department Provider Note  Time seen: 7:38 PM  I have reviewed the triage vital signs and the nursing notes.   HISTORY  Chief Complaint Fall and Back Pain    HPI Ruth Rhodes is a 82 y.o. female with a past medical history of back pain, arthritis, diabetes, hypertension presents to the emergency department after a fall. According to the patient she fell this morning after getting off balance. Does not believe she hit her head. Did not lose consciousness. Patient states she was unable to stand up. A friend came by this evening and could hear her calling for help so she called police who had to break a window to enter the patient's residence. EMS brought the patient to the emergency department. She states moderate back pain located in the upper thoracic lower cervical spine. Denies headache focal weakness or numbness.  Past Medical History:  Diagnosis Date  . Arthritis   . Back pain   . Diabetes mellitus without complication ([place])   . Hypertension     Patient Active Problem List   Diagnosis Date Noted  . Lumbar stenosis with neurogenic claudication 09/23/2015    Past Surgical History:  Procedure Laterality Date  . ABDOMINAL HYSTERECTOMY    . LUMBAR LAMINECTOMY WITH COFLEX 2 LEVEL Bilateral 09/23/2015   Procedure: Laminectomy and Foraminotomy L4-L5 bilateral with CoFlex L5-S1 - left;  Surgeon: Karie Chimera, MD;  Location: [place] [facility];  Service: Neurosurgery;  Laterality: Bilateral;  . WOUND EXPLORATION N/A 09/27/2015   Procedure: WOUND EXPLORATION;  Surgeon: Kevan Ny Ditty, MD;  Location: [place] [facility];  Service: Neurosurgery;  Laterality: N/A;  WOUND EXPLORATION    Prior to Admission medications   Medication Sig Start Date End Date Taking? Authorizing Provider  BYSTOLIC 20 MG TABS Take 20 mg by mouth daily.  03/19/15   Historical Provider, MD  docusate sodium (COLACE) 100 MG capsule Take 2 capsules (200 mg total) by mouth 2 (two)  times daily. 01/09/16   Carrie Mew, MD  gabapentin (NEURONTIN) 400 MG capsule Take 400 mg by mouth 3 (three) times daily.    Historical Provider, MD  lisinopril (PRINIVIL,ZESTRIL) 20 MG tablet Take 20 mg by mouth daily.  03/19/15   Historical Provider, MD  methocarbamol (ROBAXIN) 500 MG tablet Take 1 tablet (500 mg total) by mouth every 6 (six) hours as needed for muscle spasms. 09/28/15   Kevan Ny Ditty, MD  oxyCODONE-acetaminophen (ROXICET) 5-325 MG tablet Take 1 tablet by mouth every 4 (four) hours as needed for severe pain. 09/28/15   Kevan Ny Ditty, MD  potassium chloride SA (KLOR-CON M20) 20 MEQ tablet Take 1 tablet (20 mEq total) by mouth daily. 02/08/16   Hinda Kehr, MD  senna (SENOKOT) 8.6 MG TABS tablet Take 2 tablets (17.2 mg total) by mouth 2 (two) times daily. 01/09/16   Carrie Mew, MD    Allergies  Allergen Reactions  . Tramadol Nausea Only and Nausea And Vomiting    No family history on file.  Social History Social History  Substance Use Topics  . Smoking status: Former Research scientist (life sciences)  . Smokeless tobacco: Never Used  . Alcohol use No    Review of Systems Constitutional: Negative for fever. Cardiovascular: Negative for chest pain. Respiratory: Negative for shortness of breath. Gastrointestinal: Negative for abdominal pain Genitourinary: Negative for dysuria. Musculoskeletal: Moderate upper back pain. Neurological: Negative for headaches, focal weakness or numbness. 10-point ROS otherwise negative.  ____________________________________________   PHYSICAL EXAM:  VITAL SIGNS:  ED Triage Vitals  Enc Vitals Group     BP --      Pulse Rate 08/11/16 1931 73     Resp 08/11/16 1931 18     Temp 08/11/16 1927 98 F (36.7 C)     Temp Source 08/11/16 1927 Oral     SpO2 08/11/16 1931 100 %     Weight 08/11/16 1932 126 lb (57.2 kg)     Height 08/11/16 1932 '5\' 2"'$  (1.575 m)     Head Circumference --      Peak Flow --      Pain Score 08/11/16 1933 8      Pain Loc --      Pain Edu? --      Excl. in [place]? --     Constitutional: Alert and oriented. Well appearing and in no distress. Eyes: Normal exam ENT   Head: Normocephalic and atraumatic.   Mouth/Throat: Mucous membranes are moist. Cardiovascular: Normal rate, regular rhythm. No murmur Respiratory: Normal respiratory effort without tachypnea nor retractions. Breath sounds are clear. Chest is nontender to palpation. Gastrointestinal: Soft and nontender. No distention.  Musculoskeletal: Nontender with normal range of motion in all extremities. Mild cervical spine tenderness, mild upper thoracic tenderness. No deformity. No ecchymosis. No lumbar tenderness to palpation. Good range of motion in all extremities, pelvis is stable with good range of motion bilateral hips. Neurologic:  Normal speech and language. No gross focal neurologic deficits  Skin:  Skin is warm, dry and intact.  Psychiatric: Mood and affect are normal.  ____________________________________________     RADIOLOGY  Imaging negative for acute abnormality besides a new spiculated mass in the left upper lobe.  ____________________________________________   INITIAL IMPRESSION / ASSESSMENT AND PLAN / ED COURSE  Pertinent labs & imaging results that were available during my care of the patient were reviewed by me and considered in my medical decision making (see chart for details).  The patient presents the emergency department after a fall with back pain, unable to stand on her own after the fall. She was on the ground for a prolonged time. We'll check labs including a CK. We will obtain imaging including CT head, C-spine, T-spine and a chest x-ray. Patient has no lumbar tenderness, good range of motion in all extremities. Overall appears very well.  X-rays/CT negative for fracture or acute abnormality, however does show a concerning mass in left upper lobe. I discussed this with the patient and the need to follow up  with oncology on Monday. Patient is agreeable and will call first thing Monday morning to arrange an appointment. Patient states she feels well currently. No fracture on exam or imaging. We will discharge the patient home with oncology follow-up Monday.  ____________________________________________   FINAL CLINICAL IMPRESSION(S) / ED DIAGNOSES  Fall Back pain    Harvest Dark, MD 08/11/16 2212

## 2016-08-15 ENCOUNTER — Inpatient Hospital Stay: Payer: Medicare Other | Attending: Hematology and Oncology | Admitting: Hematology and Oncology

## 2016-08-15 ENCOUNTER — Encounter: Payer: Self-pay | Admitting: Hematology and Oncology

## 2016-08-15 DIAGNOSIS — R05 Cough: Secondary | ICD-10-CM | POA: Diagnosis not present

## 2016-08-15 DIAGNOSIS — M129 Arthropathy, unspecified: Secondary | ICD-10-CM | POA: Insufficient documentation

## 2016-08-15 DIAGNOSIS — R918 Other nonspecific abnormal finding of lung field: Secondary | ICD-10-CM

## 2016-08-15 DIAGNOSIS — R630 Anorexia: Secondary | ICD-10-CM | POA: Diagnosis not present

## 2016-08-15 DIAGNOSIS — R5383 Other fatigue: Secondary | ICD-10-CM | POA: Diagnosis not present

## 2016-08-15 DIAGNOSIS — Z9181 History of falling: Secondary | ICD-10-CM | POA: Insufficient documentation

## 2016-08-15 DIAGNOSIS — M549 Dorsalgia, unspecified: Secondary | ICD-10-CM | POA: Diagnosis not present

## 2016-08-15 DIAGNOSIS — Z79899 Other long term (current) drug therapy: Secondary | ICD-10-CM | POA: Diagnosis not present

## 2016-08-15 DIAGNOSIS — R634 Abnormal weight loss: Secondary | ICD-10-CM

## 2016-08-15 DIAGNOSIS — I10 Essential (primary) hypertension: Secondary | ICD-10-CM | POA: Insufficient documentation

## 2016-08-15 DIAGNOSIS — R1011 Right upper quadrant pain: Secondary | ICD-10-CM

## 2016-08-15 DIAGNOSIS — R599 Enlarged lymph nodes, unspecified: Secondary | ICD-10-CM | POA: Insufficient documentation

## 2016-08-15 DIAGNOSIS — E119 Type 2 diabetes mellitus without complications: Secondary | ICD-10-CM

## 2016-08-15 DIAGNOSIS — Z87891 Personal history of nicotine dependence: Secondary | ICD-10-CM | POA: Insufficient documentation

## 2016-08-15 DIAGNOSIS — M542 Cervicalgia: Secondary | ICD-10-CM | POA: Insufficient documentation

## 2016-08-15 NOTE — Progress Notes (Signed)
Patient here today as new evaluation regarding lung mass.  Referred by Dr. Brynda Greathouse.  Patient states she had a fall last week and the police had to break a window out to get in to her.  BP elevated 177/104 HR 60.  Patient states she has not taken any of her medications today.  Currently on Bystolic and lisinopril/HCTZ.

## 2016-08-15 NOTE — Progress Notes (Addendum)
Derma Clinic day:  08/15/2016  Chief Complaint: Ruth Rhodes is a 82 y.o. female with a lung mass who is referred in consultation by Dr. Nicky Pugh for assessment and management.  HPI:  The patient has a 40-50 pack year smoking history.  She stopped smoking 30 years ago.  She was seen in the [facility] ER on 08/11/2016 after a fall.  She lost her balance.  She was on the floor for several hours until a neighbor heard her calling for help.  The police broke a window to enter the house.  She was brought to the ER via EMS.  She complained of back pain.  Cervical and thoracic spine CT on 08/11/2016 revealed a 3.4 x 1.7 cm spiculated left upper lung mass which had progressed from ground glass-glass opacity with evidence of new mediastinal adenopathy.   Head CT without contrast on 08/11/2016 revealed no evidence of metastatic disease.  Labs on 08/11/2016 revealed a normal CBC with diff and CMP except a glucose of 229 and a bilirubin of 1.3.  CPK was 501.  Symptomatically, she notes pain in her arm and neck for 1 year.  She has had a cough for 3 months.  She notes right sided abdominal discomfort with decreased ability to eat.  She has lost 7 pounds over an unspecified amount of time.    Last colonoscopy was "a long time ago".  She denies any melena or hematochezia.  Energy level is "low".  She states that it takes a long time for her to get dressed.  She needs help with her bath.    Past Medical History:  Diagnosis Date  . Arthritis   . Back pain   . Diabetes mellitus without complication ([place])   . Hypertension     Past Surgical History:  Procedure Laterality Date  . ABDOMINAL HYSTERECTOMY    . LUMBAR LAMINECTOMY WITH COFLEX 2 LEVEL Bilateral 09/23/2015   Procedure: Laminectomy and Foraminotomy L4-L5 bilateral with CoFlex L5-S1 - left;  Surgeon: Karie Chimera, MD;  Location: [place] [facility];  Service: Neurosurgery;  Laterality: Bilateral;  . WOUND  EXPLORATION N/A 09/27/2015   Procedure: WOUND EXPLORATION;  Surgeon: Kevan Ny Ditty, MD;  Location: [place] [facility];  Service: Neurosurgery;  Laterality: N/A;  WOUND EXPLORATION    No family history on file.  Social History:  reports that she quit smoking about 30 years ago. She quit after 50.00 years of use. She has never used smokeless tobacco. She reports that she does not drink alcohol or use drugs.  She smoked a carton of cigarettes a week from age [age].  She previously drank alcohol socially.  She is retired.  She previously worked in [employer].  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  She has 1 son, 4 grandchildren and 3 great grandchildren.  The patient is accompanied by a friend from work, Development worker, international aid, today.  Allergies:  Allergies  Allergen Reactions  . Tramadol Nausea Only and Nausea And Vomiting    Current Medications: Current Outpatient Prescriptions  Medication Sig Dispense Refill  . BYSTOLIC 20 MG TABS Take 20 mg by mouth daily.     Marland Kitchen docusate sodium (COLACE) 100 MG capsule Take 2 capsules (200 mg total) by mouth 2 (two) times daily. 120 capsule 0  . gabapentin (NEURONTIN) 400 MG capsule Take 400 mg by mouth 3 (three) times daily.    Marland Kitchen lisinopril (PRINIVIL,ZESTRIL) 20 MG tablet Take 20 mg by mouth daily.     Marland Kitchen  methocarbamol (ROBAXIN) 500 MG tablet Take 1 tablet (500 mg total) by mouth every 6 (six) hours as needed for muscle spasms. 120 tablet 1  . oxyCODONE-acetaminophen (ROXICET) 5-325 MG tablet Take 1 tablet by mouth every 4 (four) hours as needed for severe pain. 60 tablet 0  . potassium chloride SA (KLOR-CON M20) 20 MEQ tablet Take 1 tablet (20 mEq total) by mouth daily. 7 tablet 0  . senna (SENOKOT) 8.6 MG TABS tablet Take 2 tablets (17.2 mg total) by mouth 2 (two) times daily. 120 each 0  . levofloxacin (LEVAQUIN) 500 MG tablet      No current facility-administered medications for this visit.     Review of Systems:  GENERAL:  Low energy.  No fevers or sweats.  Weight loss of  7 pounds. PERFORMANCE STATUS (ECOG):  2-3 HEENT:  Glasses changed in past 12 months.  No diplopia.  Clears throat a lot.  No runny nose, sore throat, mouth sores or tenderness. Lungs: Shortness of breath with exertion.  Cough x 3 months.  No hemoptysis. Cardiac:  No chest pain, palpitations, orthopnea, or PND. GI:  Right sided abdominal discomfort.  Decreased ability to eat.  No nausea, vomiting, diarrhea, constipation, melena or hematochezia.  Colonoscopy "a long time ago".  s/p appendectomy. GU:  No urgency, frequency, dysuria, or hematuria. Musculoskeletal:  Arm and neck pain x 1 year.  Arthritis in arms and legs.  Can "only raise arms so far".  No muscle tenderness. Extremities:  No pain or swelling. Skin:  No rashes or skin changes. Neuro:  Legs weak.  Poor balance.  Hard to get up.  Balance poor (chronic).  No headache, numbness or weakness, or coordination issues. Endocrine:  No diabetes, thyroid issues, hot flashes or night sweats. Psych:  No mood changes, depression or anxiety. Pain:  No focal pain. Review of systems:  All other systems reviewed and found to be negative.  Physical Exam: Blood pressure (!) 177/104, pulse 60, temperature (!) 96.1 F (35.6 C), temperature source Tympanic, resp. rate 18, weight 135 lb (61.2 kg). GENERAL:  Elderly woman sitting comfortably in a wheelchair in the exam room in no acute distress. MENTAL STATUS:  Alert and oriented to person, place and time. HEAD:  Wearing a blue cap.  Curly gray hair.  Normocephalic, atraumatic, face symmetric, no Cushingoid features. EYES:  Glasses.  Green eyes.  Pupils equal round and reactive to light and accomodation.  No conjunctivitis or scleral icterus. ENT:  Oropharynx clear without lesion.  Tongue normal.  Lower dentures.  Mucous membranes moist.  RESPIRATORY:  Clear to auscultation without rales, wheezes or rhonchi. CARDIOVASCULAR:  Regular rate and rhythm without murmur, rub or gallop. ABDOMEN:  Soft, slightly  tender RLQ without guarding or rebound tenderness.  Active bowel sounds and no hepatosplenomegaly.  No masses. SKIN:  No rashes, ulcers or lesions. EXTREMITIES:  Decreased upper extremity range of motion.  No edema, no skin discoloration or tenderness.  No palpable cords. LYMPH NODES: No palpable cervical, supraclavicular, axillary or inguinal adenopathy  NEUROLOGICAL: Alert & oriented, cranial nerves II-XII intact; motor strength symmetric; sensation intact; finger to nose and RAM normal; no clonus or Babinski.  PSYCH:  Appropriate.   No visits with results within 3 Day(s) from this visit.  Latest known visit with results is:  Admission on 08/11/2016, Discharged on 08/11/2016  Component Date Value Ref Range Status  . WBC 08/11/2016 8.9  3.6 - 11.0 K/uL Final  . RBC 08/11/2016 5.21* 3.80 - 5.20  MIL/uL Final  . Hemoglobin 08/11/2016 15.5  12.0 - 16.0 g/dL Final  . HCT 08/11/2016 44.4  35.0 - 47.0 % Final  . MCV 08/11/2016 85.3  80.0 - 100.0 fL Final  . MCH 08/11/2016 29.8  26.0 - 34.0 pg Final  . MCHC 08/11/2016 35.0  32.0 - 36.0 g/dL Final  . RDW 08/11/2016 14.6* 11.5 - 14.5 % Final  . Platelets 08/11/2016 259  150 - 440 K/uL Final  . Sodium 08/11/2016 137  135 - 145 mmol/L Final  . Potassium 08/11/2016 3.6  3.5 - 5.1 mmol/L Final  . Chloride 08/11/2016 100* 101 - 111 mmol/L Final  . CO2 08/11/2016 25  22 - 32 mmol/L Final  . Glucose, Bld 08/11/2016 229* 65 - 99 mg/dL Final  . BUN 08/11/2016 9  6 - 20 mg/dL Final  . Creatinine, Ser 08/11/2016 0.48  0.44 - 1.00 mg/dL Final  . Calcium 08/11/2016 10.2  8.9 - 10.3 mg/dL Final  . Total Protein 08/11/2016 8.0  6.5 - 8.1 g/dL Final  . Albumin 08/11/2016 4.5  3.5 - 5.0 g/dL Final  . AST 08/11/2016 33  15 - 41 U/L Final  . ALT 08/11/2016 18  14 - 54 U/L Final  . Alkaline Phosphatase 08/11/2016 87  38 - 126 U/L Final  . Total Bilirubin 08/11/2016 1.3* 0.3 - 1.2 mg/dL Final  . GFR calc non Af Amer 08/11/2016 >60  >60 mL/min Final  . GFR calc  Af Amer 08/11/2016 >60  >60 mL/min Final   Comment: (NOTE) The eGFR has been calculated using the CKD EPI equation. This calculation has not been validated in all clinical situations. eGFR's persistently <60 mL/min signify possible Chronic Kidney Disease.   . Anion gap 08/11/2016 12  5 - 15 Final  . Total CK 08/11/2016 501* 38 - 234 U/L Final    Assessment:  Ruth Rhodes is a 82 y.o. female with a left upper lobe lung mass after presenting with a fall.  She has a 40-50 pack year smoking history.  Cervical and thoracic spine CT on 08/11/2016 revealed a 3.4 x 1.7 cm spiculated left upper lung mass which had progressed from ground glass-glass opacity with evidence of new mediastinal adenopathy.   Head CT without contrast on 08/11/2016 revealed no evidence of metastatic disease.  Symptomatically, she is fatigued.  She has had a cough x 3 months.  She has lost 7 pounds over an unspecified period of time.  She has right lower quadrant discomfort.  Last colonoscopy was years ago.  She denies any melena or hematochezia.  Performance status is 2-3.  Plan: 1.  Discuss recent imaging studies from ER.  Discuss left upper lobe spiculated mass suggestive of lung cancer.  Discuss plan for PET scan to assess extent of disease.  Discuss plan for biopsy to confirm diagnosis.  Preliminary discussion regarding treatment for lung cancer (small cell versus non-small cell).  Discuss role of surgery, chemotherapy, and radiation.  Discuss full evaluation and follow-up to discuss treatment options. 2.  Discuss chronic lower abdominal symptoms.  Etiology unclear.  Await results from PET scan. 3.  PET scan 4.  RTC after PET scan.   Lequita Asal, MD  08/15/2016, 3:11 PM

## 2016-08-16 ENCOUNTER — Telehealth: Payer: Self-pay | Admitting: *Deleted

## 2016-08-16 ENCOUNTER — Other Ambulatory Visit: Payer: Self-pay | Admitting: Neurological Surgery

## 2016-08-16 DIAGNOSIS — M5412 Radiculopathy, cervical region: Secondary | ICD-10-CM

## 2016-08-16 NOTE — Telephone Encounter (Signed)
Patient called wanting to add a contact number to her demographics. Writer advised her to have contact added at next appointment at registration desk.

## 2016-08-18 ENCOUNTER — Telehealth: Payer: Self-pay | Admitting: *Deleted

## 2016-08-18 NOTE — Telephone Encounter (Signed)
PET dept called patient regarding her appt Monday and she told them she does not think she will be able to come to appt Monday, she is sick and is unable to move her legs. They suggested she come to ER or go to a walk in clinic, but she declined to go to ER by EMS. They suggested she call a friend. Want [place] to follow up on this. I called patient she reports that she is dragging her foot on the left side this morning. I asked her to call a friend and go ER, she stated she will get a friend to take her but she has to wait until they get off work at 3 PM to go. She asked if we could call her something in for her nerves. Please advise

## 2016-08-18 NOTE — Telephone Encounter (Signed)
Per Dr Mike Gip, ER can give her med when she goes. Patient advised

## 2016-08-21 ENCOUNTER — Telehealth: Payer: Self-pay | Admitting: *Deleted

## 2016-08-21 ENCOUNTER — Encounter: Admission: RE | Admit: 2016-08-21 | Payer: Medicare Other | Source: Ambulatory Visit

## 2016-08-21 NOTE — Telephone Encounter (Signed)
Patient called the clinic with the same complaints as of Friday.  Stated "I am sick and I can't raise my arms all the way up".  Patient was instructed by MD on Friday to go to ED but was noncompliant in this.  Patient reports that she cannot get to the ED or to the clinic as she does not have transportation and is unwilling to call EMS.  Patient instructed to call EMS and come to ED, stated "I will get a ride, I can't call EMS".

## 2016-08-23 ENCOUNTER — Telehealth: Payer: Self-pay | Admitting: *Deleted

## 2016-08-23 NOTE — Telephone Encounter (Signed)
Called patient in regarding appointment/PET scan. Patient is scheduled to see Friday, December 8th.  Was scheduled for PET scan on December 4th.  Did not have scan done.  Refusing to come to Friday's appointment.  Explained to patient the importance of getting her scan done and following up with MD so that we can treat her cancer. Patient became tearful but was in agreement to reschedule but wants to wait until after christmas. Will have scheduling reschedule appointments, schedule Lucianne Lei for transportation and notify patient.

## 2016-08-23 NOTE — Telephone Encounter (Addendum)
JT in cancer ctr transportation reports that patient is refusing to keep her appointments with MD this week. He called to remind her that he would be coming to pick her up as scheduled. She stated, "I am not coming to this appointment."  Please let Dr. Mike Gip know. Thanks, Nira Conn, RN

## 2016-08-25 ENCOUNTER — Inpatient Hospital Stay: Payer: Medicare Other | Admitting: Hematology and Oncology

## 2016-08-25 ENCOUNTER — Ambulatory Visit: Payer: Medicare Other

## 2016-09-05 IMAGING — US US EXTREM LOW VENOUS*L*
1 series · 13 of 24 positions shown · non-contrast
Comparison: None.

CLINICAL DATA: Left leg swelling for 1 day.



[Series 1: us extrem low venous*left* · 0.07mm/px · 13 of 34 slices shown]
[im 1/34]
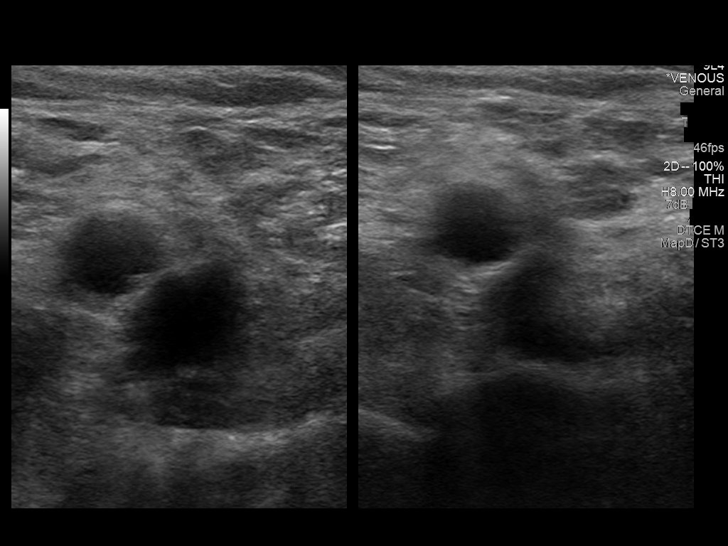
[im 3/34]
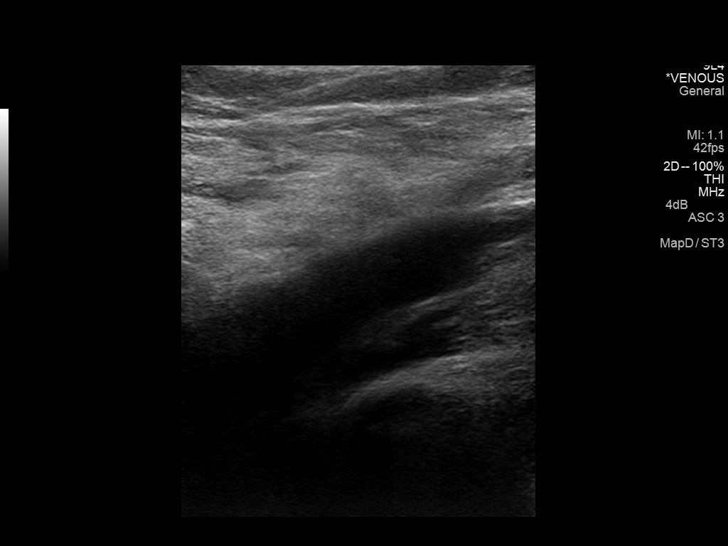
[im 6/34]
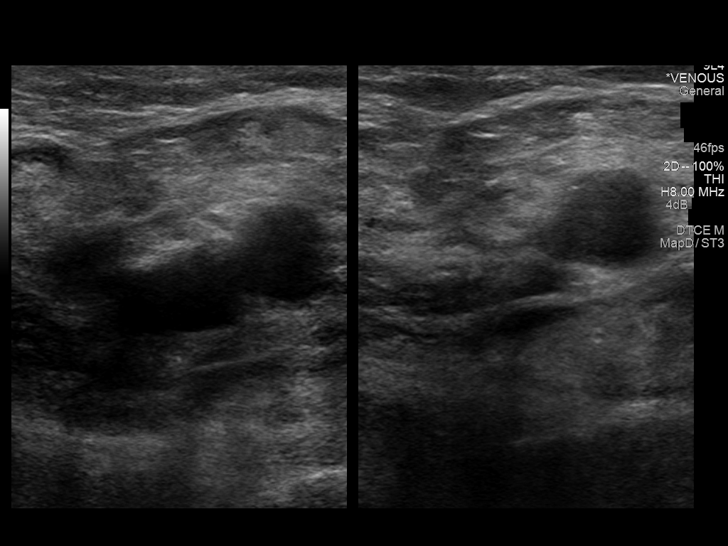
[im 9/34]
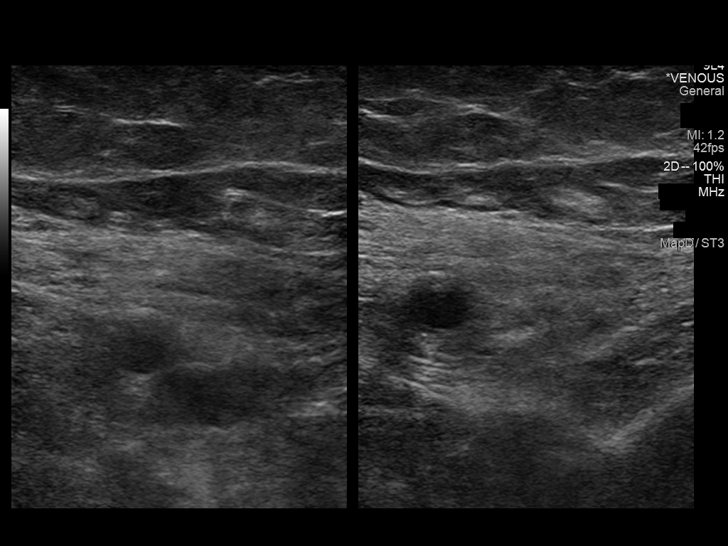
[im 12/34]
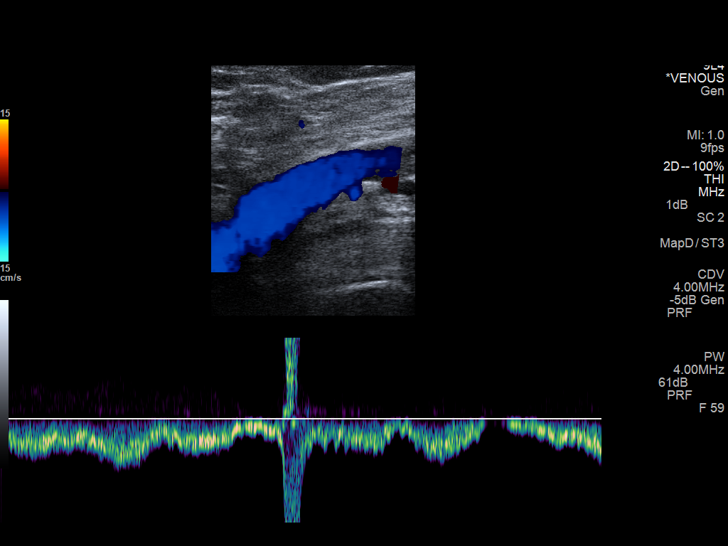
[im 15/34]
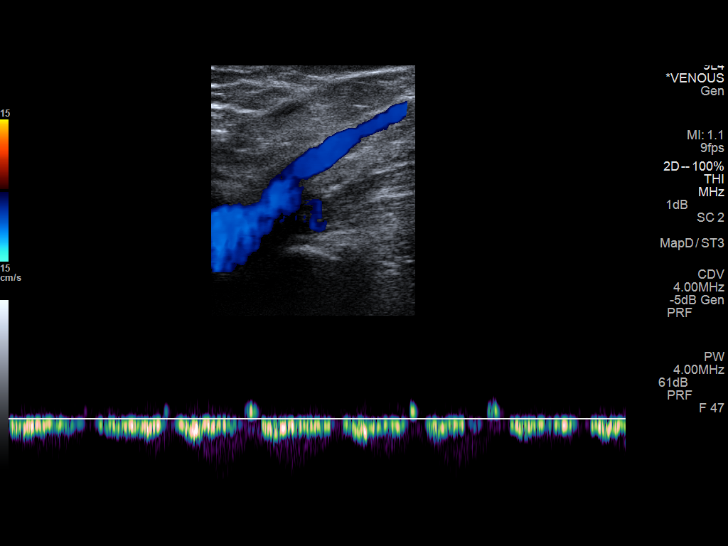
[im 18/34]
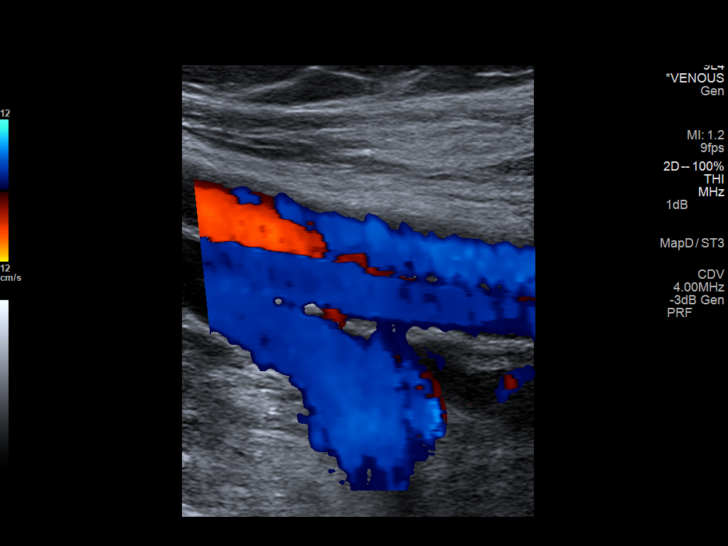
[im 19/34]
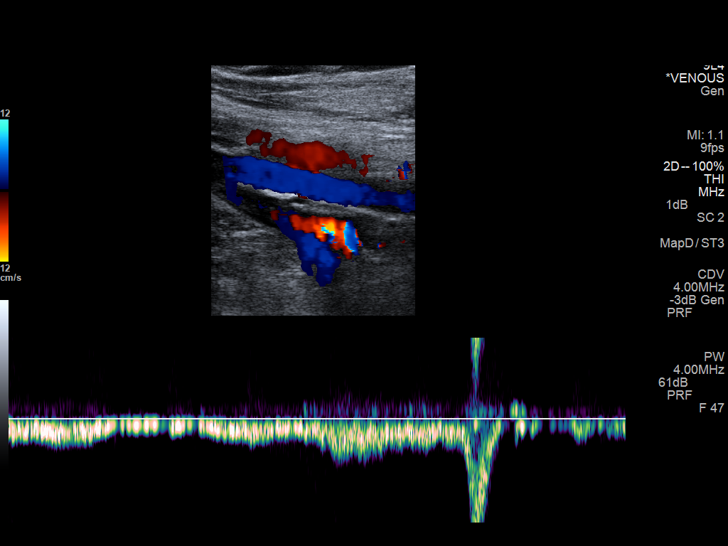
[im 22/34]
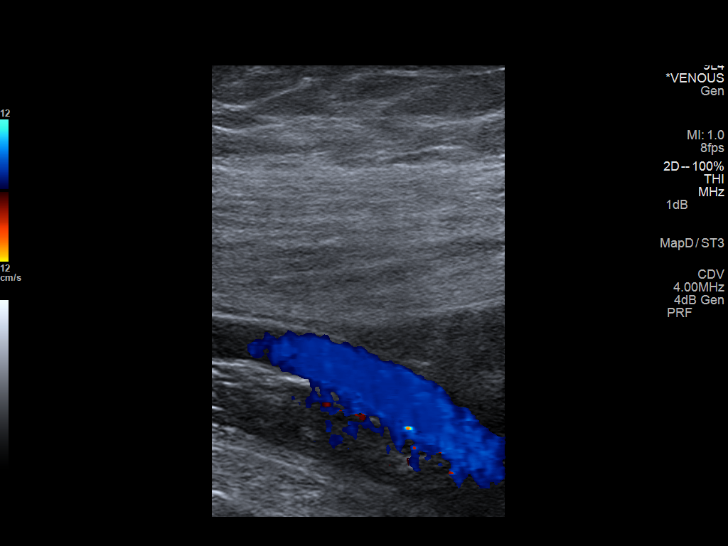
[im 25/34]
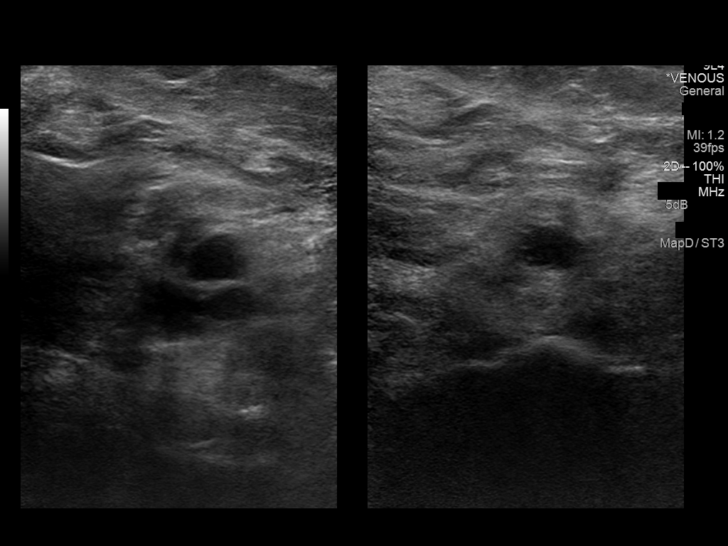
[im 28/34]
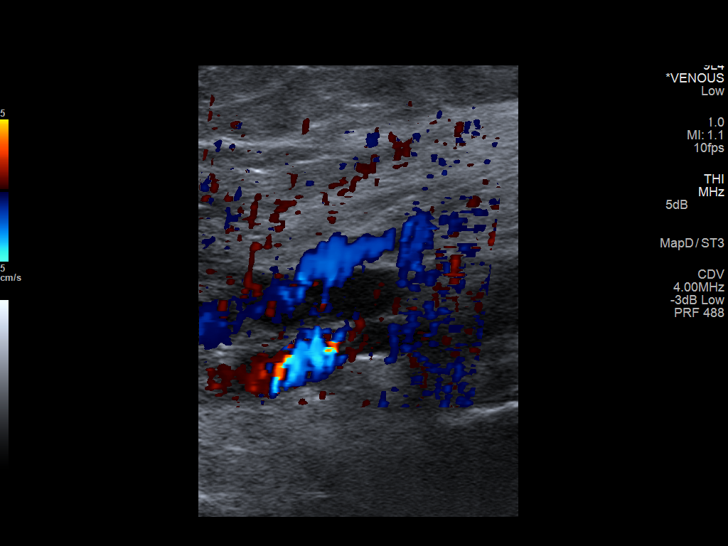
[im 31/34]
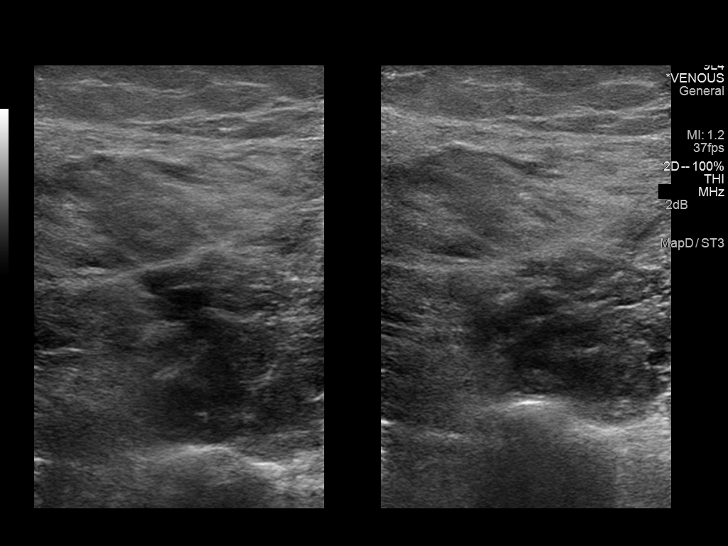
[im 34/34]
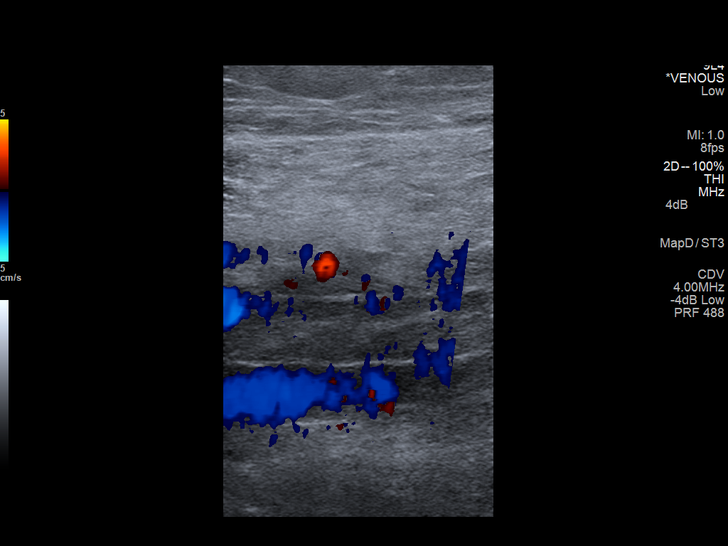

[13 of 24 positions shown; findings below may reference images not displayed]

FINDINGS: Contralateral Common Femoral Vein: Respiratory phasicity is normal
and symmetric with the symptomatic side. No evidence of thrombus.
Normal compressibility.

Common Femoral Vein: No evidence of thrombus. Normal
compressibility, respiratory phasicity and response to augmentation.

Saphenofemoral Junction: No evidence of thrombus. Normal
compressibility and flow on color Doppler imaging.

Profunda Femoral Vein: No evidence of thrombus. Normal
compressibility and flow on color Doppler imaging.

Femoral Vein: No evidence of thrombus. Normal compressibility,
respiratory phasicity and response to augmentation.

Popliteal Vein: No evidence of thrombus. Normal compressibility,
respiratory phasicity and response to augmentation.

Calf Veins: No evidence of thrombus. Normal compressibility and flow
on color Doppler imaging.

Superficial Great Saphenous Vein: No evidence of thrombus. Normal
compressibility and flow on color Doppler imaging.

Venous Reflux:  None.

Other Findings:  None.
IMPRESSION: No evidence of deep venous thrombosis.

## 2016-09-06 HISTORY — PX: FINE NEEDLE ASPIRATION: SHX406

## 2016-09-15 ENCOUNTER — Ambulatory Visit: Admission: RE | Admit: 2016-09-15 | Payer: Medicare Other | Source: Ambulatory Visit

## 2016-09-19 ENCOUNTER — Inpatient Hospital Stay: Payer: Medicare Other | Admitting: Hematology and Oncology

## 2016-09-19 ENCOUNTER — Encounter: Payer: Self-pay | Admitting: Hematology and Oncology

## 2016-09-19 NOTE — Progress Notes (Deleted)
[facility] day:  09/19/2016  Chief Complaint: Ruth Rhodes is a 83 y.o. female with a lung mass who is seen for reassessment.  HPI:  The patient was last seen in the medical oncology clinic on 08/15/2016.  At that time, she was seen for initial consultation.  She had a 3.4 cm left upper lobe lung mass worrisome for primary lung cancer.  We discuss initial PET scan prior to biopsy.   PET scan was scheduled for 08/21/2016.  She did not have her PET scan.  She has not followed up in clinic.  She has spoken to nursing on several occasions (last 08/23/2016).  She has had issues with transportation.  Lucianne Lei transportation was discussed.  She wanted to wait until afterChristmas.    Past Medical History:  Diagnosis Date  . Arthritis   . Back pain   . Diabetes mellitus without complication ([place])   . Hypertension     Past Surgical History:  Procedure Laterality Date  . ABDOMINAL HYSTERECTOMY    . LUMBAR LAMINECTOMY WITH COFLEX 2 LEVEL Bilateral 09/23/2015   Procedure: Laminectomy and Foraminotomy L4-L5 bilateral with CoFlex L5-S1 - left;  Surgeon: Karie Chimera, MD;  Location: [place] [facility];  Service: Neurosurgery;  Laterality: Bilateral;  . WOUND EXPLORATION N/A 09/27/2015   Procedure: WOUND EXPLORATION;  Surgeon: Kevan Ny Ditty, MD;  Location: [place] [facility];  Service: Neurosurgery;  Laterality: N/A;  WOUND EXPLORATION    No family history on file.  Social History:  reports that she quit smoking about 30 years ago. She quit after 50.00 years of use. She has never used smokeless tobacco. She reports that she does not drink alcohol or use drugs.  She smoked a carton of cigarettes a week from age [age].  She previously drank alcohol socially.  She is retired.  She previously worked in [employer].  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  She has 1 son, 4 grandchildren and 3 great grandchildren.  The patient is accompanied by a friend from work, Development worker, international aid,  today.  Allergies:  Allergies  Allergen Reactions  . Tramadol Nausea Only and Nausea And Vomiting    Current Medications: Current Outpatient Prescriptions  Medication Sig Dispense Refill  . BYSTOLIC 20 MG TABS Take 20 mg by mouth daily.     Marland Kitchen docusate sodium (COLACE) 100 MG capsule Take 2 capsules (200 mg total) by mouth 2 (two) times daily. 120 capsule 0  . gabapentin (NEURONTIN) 400 MG capsule Take 400 mg by mouth 3 (three) times daily.    Marland Kitchen levofloxacin (LEVAQUIN) 500 MG tablet     . lisinopril (PRINIVIL,ZESTRIL) 20 MG tablet Take 20 mg by mouth daily.     . methocarbamol (ROBAXIN) 500 MG tablet Take 1 tablet (500 mg total) by mouth every 6 (six) hours as needed for muscle spasms. 120 tablet 1  . oxyCODONE-acetaminophen (ROXICET) 5-325 MG tablet Take 1 tablet by mouth every 4 (four) hours as needed for severe pain. 60 tablet 0  . potassium chloride SA (KLOR-CON M20) 20 MEQ tablet Take 1 tablet (20 mEq total) by mouth daily. 7 tablet 0  . senna (SENOKOT) 8.6 MG TABS tablet Take 2 tablets (17.2 mg total) by mouth 2 (two) times daily. 120 each 0   No current facility-administered medications for this visit.     Review of Systems:  GENERAL:  Low energy.  No fevers or sweats.  Weight loss of 7 pounds. PERFORMANCE STATUS (ECOG):  2-3 HEENT:  Glasses changed in past 12 months.  No diplopia.  Clears throat a lot.  No runny nose, sore throat, mouth sores or tenderness. Lungs: Shortness of breath with exertion.  Cough x 3 months.  No hemoptysis. Cardiac:  No chest pain, palpitations, orthopnea, or PND. GI:  Right sided abdominal discomfort.  Decreased ability to eat.  No nausea, vomiting, diarrhea, constipation, melena or hematochezia.  Colonoscopy "a long time ago".  s/p appendectomy. GU:  No urgency, frequency, dysuria, or hematuria. Musculoskeletal:  Arm and neck pain x 1 year.  Arthritis in arms and legs.  Can "only raise arms so far".  No muscle tenderness. Extremities:  No pain or  swelling. Skin:  No rashes or skin changes. Neuro:  Legs weak.  Poor balance.  Hard to get up.  Balance poor (chronic).  No headache, numbness or weakness, or coordination issues. Endocrine:  No diabetes, thyroid issues, hot flashes or night sweats. Psych:  No mood changes, depression or anxiety. Pain:  No focal pain. Review of systems:  All other systems reviewed and found to be negative.  Physical Exam: There were no vitals taken for this visit. GENERAL:  Elderly woman sitting comfortably in a wheelchair in the exam room in no acute distress. MENTAL STATUS:  Alert and oriented to person, place and time. HEAD:  Wearing a blue cap.  Curly gray hair.  Normocephalic, atraumatic, face symmetric, no Cushingoid features. EYES:  Glasses.  Green eyes.  Pupils equal round and reactive to light and accomodation.  No conjunctivitis or scleral icterus. ENT:  Oropharynx clear without lesion.  Tongue normal.  Lower dentures.  Mucous membranes moist.  RESPIRATORY:  Clear to auscultation without rales, wheezes or rhonchi. CARDIOVASCULAR:  Regular rate and rhythm without murmur, rub or gallop. ABDOMEN:  Soft, slightly tender RLQ without guarding or rebound tenderness.  Active bowel sounds and no hepatosplenomegaly.  No masses. SKIN:  No rashes, ulcers or lesions. EXTREMITIES:  Decreased upper extremity range of motion.  No edema, no skin discoloration or tenderness.  No palpable cords. LYMPH NODES: No palpable cervical, supraclavicular, axillary or inguinal adenopathy  NEUROLOGICAL: Alert & oriented, cranial nerves II-XII intact; motor strength symmetric; sensation intact; finger to nose and RAM normal; no clonus or Babinski.  PSYCH:  Appropriate.   No visits with results within 3 Day(s) from this visit.  Latest known visit with results is:  Admission on 08/11/2016, Discharged on 08/11/2016  Component Date Value Ref Range Status  . WBC 08/11/2016 8.9  3.6 - 11.0 K/uL Final  . RBC 08/11/2016 5.21* 3.80  - 5.20 MIL/uL Final  . Hemoglobin 08/11/2016 15.5  12.0 - 16.0 g/dL Final  . HCT 08/11/2016 44.4  35.0 - 47.0 % Final  . MCV 08/11/2016 85.3  80.0 - 100.0 fL Final  . MCH 08/11/2016 29.8  26.0 - 34.0 pg Final  . MCHC 08/11/2016 35.0  32.0 - 36.0 g/dL Final  . RDW 08/11/2016 14.6* 11.5 - 14.5 % Final  . Platelets 08/11/2016 259  150 - 440 K/uL Final  . Sodium 08/11/2016 137  135 - 145 mmol/L Final  . Potassium 08/11/2016 3.6  3.5 - 5.1 mmol/L Final  . Chloride 08/11/2016 100* 101 - 111 mmol/L Final  . CO2 08/11/2016 25  22 - 32 mmol/L Final  . Glucose, Bld 08/11/2016 229* 65 - 99 mg/dL Final  . BUN 08/11/2016 9  6 - 20 mg/dL Final  . Creatinine, Ser 08/11/2016 0.48  0.44 - 1.00 mg/dL Final  .  Calcium 08/11/2016 10.2  8.9 - 10.3 mg/dL Final  . Total Protein 08/11/2016 8.0  6.5 - 8.1 g/dL Final  . Albumin 08/11/2016 4.5  3.5 - 5.0 g/dL Final  . AST 08/11/2016 33  15 - 41 U/L Final  . ALT 08/11/2016 18  14 - 54 U/L Final  . Alkaline Phosphatase 08/11/2016 87  38 - 126 U/L Final  . Total Bilirubin 08/11/2016 1.3* 0.3 - 1.2 mg/dL Final  . GFR calc non Af Amer 08/11/2016 >60  >60 mL/min Final  . GFR calc Af Amer 08/11/2016 >60  >60 mL/min Final   Comment: (NOTE) The eGFR has been calculated using the CKD EPI equation. This calculation has not been validated in all clinical situations. eGFR's persistently <60 mL/min signify possible Chronic Kidney Disease.   . Anion gap 08/11/2016 12  5 - 15 Final  . Total CK 08/11/2016 501* 38 - 234 U/L Final    Assessment:  LEDIA HANFORD is a 83 y.o. female with a left upper lobe lung mass after presenting with a fall.  She has a 40-50 pack year smoking history.  Cervical and thoracic spine CT on 08/11/2016 revealed a 3.4 x 1.7 cm spiculated left upper lung mass which had progressed from ground glass-glass opacity with evidence of new mediastinal adenopathy.   Head CT without contrast on 08/11/2016 revealed no evidence of metastatic  disease.  Symptomatically, she is fatigued.  She has had a cough x 3 months.  She has lost 7 pounds over an unspecified period of time.  She has right lower quadrant discomfort.  Last colonoscopy was years ago.  She denies any melena or hematochezia.  Performance status is 2-3.  Plan: 1.  Discuss recent imaging studies from ER.  Discuss left upper lobe spiculated mass suggestive of lung cancer.  Discuss plan for PET scan to assess extent of disease.  Discuss plan for biopsy to confirm diagnosis.  Preliminary discussion regarding treatment for lung cancer (small cell versus non-small cell).  Discuss role of surgery, chemotherapy, and radiation.  Discuss full evaluation and follow-up to discuss treatment options. 2.  Discuss chronic lower abdominal symptoms.  Etiology unclear.  Await results from PET scan. 3.  PET scan 4.  RTC after PET scan.   Lequita Asal, MD  09/19/2016, 4:16 AM

## 2016-10-27 ENCOUNTER — Emergency Department
Admission: EM | Admit: 2016-10-27 | Discharge: 2016-10-28 | Disposition: A | Discharge status: Home / Self Care | Payer: Medicare HMO | Attending: Emergency Medicine | Admitting: Emergency Medicine

## 2016-10-27 DIAGNOSIS — E119 Type 2 diabetes mellitus without complications: Secondary | ICD-10-CM | POA: Insufficient documentation

## 2016-10-27 DIAGNOSIS — Z87891 Personal history of nicotine dependence: Secondary | ICD-10-CM | POA: Diagnosis not present

## 2016-10-27 DIAGNOSIS — R1031 Right lower quadrant pain: Secondary | ICD-10-CM | POA: Diagnosis not present

## 2016-10-27 DIAGNOSIS — I10 Essential (primary) hypertension: Secondary | ICD-10-CM | POA: Insufficient documentation

## 2016-10-27 HISTORY — DX: Malignant (primary) neoplasm, unspecified: C80.1

## 2016-10-27 LAB — COMPREHENSIVE METABOLIC PANEL
ALBUMIN: 3.8 g/dL (ref 3.5–5.0)
ALT: 14 U/L (ref 14–54)
AST: 24 U/L (ref 15–41)
Alkaline Phosphatase: 87 U/L (ref 38–126)
Anion gap: 8 (ref 5–15)
BILIRUBIN TOTAL: 0.5 mg/dL (ref 0.3–1.2)
BUN: 18 mg/dL (ref 6–20)
CO2: 30 mmol/L (ref 22–32)
CREATININE: 0.54 mg/dL (ref 0.44–1.00)
Calcium: 9.9 mg/dL (ref 8.9–10.3)
Chloride: 100 mmol/L — ABNORMAL LOW (ref 101–111)
GFR calc Af Amer: >60 mL/min (ref >60)
GLUCOSE: 198 mg/dL — AB (ref 65–99)
POTASSIUM: 3.9 mmol/L (ref 3.5–5.1)
Sodium: 138 mmol/L (ref 135–145)
TOTAL PROTEIN: 7.8 g/dL (ref 6.5–8.1)

## 2016-10-27 LAB — CBC
HEMATOCRIT: 36.6 % (ref 35.0–47.0)
Hemoglobin: 12.7 g/dL (ref 12.0–16.0)
MCH: 28.9 pg (ref 26.0–34.0)
MCHC: 34.6 g/dL (ref 32.0–36.0)
MCV: 83.7 fL (ref 80.0–100.0)
PLATELETS: 323 10*3/uL (ref 150–440)
RBC: 4.38 MIL/uL (ref 3.80–5.20)
RDW: 15.4 % — AB (ref 11.5–14.5)
WBC: 5.7 10*3/uL (ref 3.6–11.0)

## 2016-10-27 NOTE — ED Triage Notes (Signed)
Pt with mid lower abdominal pain for last hour with no vomiting, diarrhea, nausea. Pt states she has cancer but does not know what kind. Pt denies chills or known fever. Pt states pain radiates to her back.

## 2016-10-28 ENCOUNTER — Emergency Department: Payer: Medicare HMO

## 2016-10-28 DIAGNOSIS — R1031 Right lower quadrant pain: Secondary | ICD-10-CM | POA: Diagnosis not present

## 2016-10-28 MED ORDER — MORPHINE SULFATE (PF) 4 MG/ML IV SOLN
4.0000 mg | Freq: Once | INTRAVENOUS | Status: AC
  Administered 2016-10-28: 4 mg via INTRAVENOUS

## 2016-10-28 MED ORDER — MAGNESIUM CITRATE PO SOLN
1.0000 | Freq: Once | ORAL | Status: DC
  Filled 2016-10-28: qty 296

## 2016-10-28 MED ORDER — IOPAMIDOL (ISOVUE-300) INJECTION 61%
100.0000 mL | Freq: Once | INTRAVENOUS | Status: AC | PRN
  Administered 2016-10-28: 100 mL via INTRAVENOUS

## 2016-10-28 MED ORDER — IOPAMIDOL (ISOVUE-300) INJECTION 61%
30.0000 mL | Freq: Once | INTRAVENOUS | Status: AC | PRN
  Administered 2016-10-28: 30 mL via ORAL

## 2016-10-28 MED ORDER — MORPHINE SULFATE (PF) 2 MG/ML IV SOLN
INTRAVENOUS | Status: AC
  Filled 2016-10-28: qty 2

## 2016-10-28 MED ORDER — ONDANSETRON HCL 4 MG/2ML IJ SOLN
4.0000 mg | Freq: Once | INTRAMUSCULAR | Status: AC
  Administered 2016-10-28: 4 mg via INTRAVENOUS
  Filled 2016-10-28: qty 2

## 2016-10-28 MED ORDER — MORPHINE SULFATE (PF) 2 MG/ML IV SOLN
2.0000 mg | Freq: Once | INTRAVENOUS | Status: AC
  Administered 2016-10-28: 2 mg via INTRAVENOUS
  Filled 2016-10-28: qty 1

## 2016-10-28 NOTE — ED Notes (Signed)
Patient transported to CT 

## 2016-10-28 NOTE — ED Notes (Signed)
Pt. Friend taking pt. Home.

## 2016-10-28 NOTE — ED Provider Notes (Signed)
[facility] Emergency Department Provider Note  Time seen: 11:35 PM  I have reviewed the triage vital signs and the nursing notes.   HISTORY  Chief Complaint Abdominal Pain    HPI Ruth Rhodes is a 82 y.o. female with below list  of chronic medical conditions presents to the emergency department with right lower quadrant abdominal pain with onset approximately one hour ago. Patient denies any vomiting no diarrhea constipation and nausea. Patient denies any fever or febrile on presentation. Patient states her current pain score is 9 out of 10.   Past Medical History:  Diagnosis Date  . Arthritis   . Back pain   . Cancer ([place])   . Diabetes mellitus without complication ([place])   . Hypertension     Patient Active Problem List   Diagnosis Date Noted  . Mass of upper lobe of left lung 08/11/2016  . Lumbar stenosis with neurogenic claudication 09/23/2015    Past Surgical History:  Procedure Laterality Date  . ABDOMINAL HYSTERECTOMY    . LUMBAR LAMINECTOMY WITH COFLEX 2 LEVEL Bilateral 09/23/2015   Procedure: Laminectomy and Foraminotomy L4-L5 bilateral with CoFlex L5-S1 - left;  Surgeon: Karie Chimera, MD;  Location: [place] [facility];  Service: Neurosurgery;  Laterality: Bilateral;  . WOUND EXPLORATION N/A 09/27/2015   Procedure: WOUND EXPLORATION;  Surgeon: Kevan Ny Ditty, MD;  Location: [place] [facility];  Service: Neurosurgery;  Laterality: N/A;  WOUND EXPLORATION    Prior to Admission medications   Medication Sig Start Date End Date Taking? Authorizing Provider  BYSTOLIC 20 MG TABS Take 20 mg by mouth daily.  03/19/15   Historical Provider, MD  docusate sodium (COLACE) 100 MG capsule Take 2 capsules (200 mg total) by mouth 2 (two) times daily. 01/09/16   Carrie Mew, MD  gabapentin (NEURONTIN) 400 MG capsule Take 400 mg by mouth 3 (three) times daily.    Historical Provider, MD  levofloxacin (LEVAQUIN) 500 MG tablet  08/05/16   Historical Provider,  MD  lisinopril (PRINIVIL,ZESTRIL) 20 MG tablet Take 20 mg by mouth daily.  03/19/15   Historical Provider, MD  methocarbamol (ROBAXIN) 500 MG tablet Take 1 tablet (500 mg total) by mouth every 6 (six) hours as needed for muscle spasms. 09/28/15   Kevan Ny Ditty, MD  oxyCODONE-acetaminophen (ROXICET) 5-325 MG tablet Take 1 tablet by mouth every 4 (four) hours as needed for severe pain. 09/28/15   Kevan Ny Ditty, MD  potassium chloride SA (KLOR-CON M20) 20 MEQ tablet Take 1 tablet (20 mEq total) by mouth daily. 02/08/16   Hinda Kehr, MD  senna (SENOKOT) 8.6 MG TABS tablet Take 2 tablets (17.2 mg total) by mouth 2 (two) times daily. 01/09/16   Carrie Mew, MD    Allergies Tramadol  No family history on file.  Social History Social History  Substance Use Topics  . Smoking status: Former Smoker    Years: 50.00    Quit date: 02/16/1986  . Smokeless tobacco: Never Used  . Alcohol use No    Review of Systems Constitutional: No fever/chills Eyes: No visual changes. ENT: No sore throat. Cardiovascular: Denies chest pain. Respiratory: Denies shortness of breath. Gastrointestinal: Positive for abdominal pain.  No nausea, no vomiting.  No diarrhea.  No constipation. Genitourinary: Negative for dysuria. Musculoskeletal: Negative for back pain. Skin: Negative for rash. Neurological: Negative for headaches, focal weakness or numbness.  10-point ROS otherwise negative.  ____________________________________________   PHYSICAL EXAM:  VITAL SIGNS: ED Triage Vitals  Enc  Vitals Group     BP 10/27/16 2329 (!) 181/83     Pulse Rate 10/27/16 2329 72     Resp 10/27/16 2329 20     Temp 10/27/16 2329 98.4 F (36.9 C)     Temp Source 10/27/16 2329 Oral     SpO2 10/27/16 2329 99 %     Weight 10/27/16 2327 136 lb (61.7 kg)     Height 10/27/16 2327 '5\' 2"'$  (1.575 m)     Head Circumference --      Peak Flow --      Pain Score 10/28/16 0123 10     Pain Loc --      Pain Edu? --       Excl. in [place]? --     Constitutional: Alert and oriented. Well appearing and in no acute distress. Eyes: Conjunctivae are normal. PERRL. EOMI. Head: Atraumatic. Ears:  Healthy appearing ear canals and TMs bilaterally Nose: No congestion/rhinnorhea. Mouth/Throat: Mucous membranes are moist Neck: No stridor.  No meningeal signs.   Cardiovascular: Normal rate, regular rhythm. Good peripheral circulation. Grossly normal heart sounds. Respiratory: Normal respiratory effort.  No retractions. Lungs CTAB. Gastrointestinal: Soft and nontender. No distention.  Musculoskeletal: No lower extremity tenderness nor edema. No gross deformities of extremities. Neurologic:  Normal speech and language. No gross focal neurologic deficits are appreciated.  Skin:  Skin is warm, dry and intact. No rash noted. Psychiatric: Mood and affect are normal. Speech and behavior are normal.  ____________________________________________   LABS (all labs ordered are listed, but only abnormal results are displayed)  Labs Reviewed  COMPREHENSIVE METABOLIC PANEL - Abnormal; Notable for the following:       Result Value   Chloride 100 (*)    Glucose, Bld 198 (*)    All other components within normal limits  CBC - Abnormal; Notable for the following:    RDW 15.4 (*)    All other components within normal limits    RADIOLOGY I, [place] Dwight Burdo, personally viewed and evaluated these images (plain radiographs) as part of my medical decision making, as well as reviewing the written report by the radiologist.  Ct Abdomen Pelvis W Contrast  Result Date: 10/28/2016 CLINICAL DATA:  Right upper quadrant and right lower quadrant pain for our. Pain radiates to the back. History of lung cancer. EXAM: CT ABDOMEN AND PELVIS WITH CONTRAST TECHNIQUE: Multidetector CT imaging of the abdomen and pelvis was performed using the standard protocol following bolus administration of intravenous contrast. CONTRAST:  147m ISOVUE-300 IOPAMIDOL  (ISOVUE-300) INJECTION 61% COMPARISON:  02/08/2016 FINDINGS: Lower chest: Dependent atelectasis in the lung bases. Hepatobiliary: Small cysts in the liver adjacent to the gallbladder fossa and in the inferior right lobe without change. No other focal liver lesions identified. Gallbladder and bile ducts are unremarkable peer Pancreas: Unremarkable. No pancreatic ductal dilatation or surrounding inflammatory changes. Spleen: Normal in size without focal abnormality. Adrenals/Urinary Tract: No adrenal gland nodules. Cyst in the lower pole left kidney. Renal nephrograms are symmetrical. No hydronephrosis or hydroureter. Bladder wall is not thickened and no filling defects are demonstrated. Stomach/Bowel: Stomach, small bowel, and colon are not abnormally distended and no wall thickening is appreciated. Diffusely stool-filled colon. Diverticulosis of the sigmoid colon. Appendix is not identified. Vascular/Lymphatic: Aortic atherosclerosis. No enlarged abdominal or pelvic lymph nodes. Reproductive: Status post hysterectomy. No adnexal masses. Other: Small periumbilical hernia containing fat. No free air or free fluid in the abdomen. Musculoskeletal: Degenerative changes in the lumbar spine. Postoperative changes in  the posterior elements at L5. No destructive bone lesions. IMPRESSION: No acute process demonstrated in the abdomen or pelvis. No significant changes since previous study. Electronically Signed   By: Lucienne Capers M.D.   On: 10/28/2016 02:31      Procedures   INITIAL IMPRESSION / ASSESSMENT AND PLAN / ED COURSE  Pertinent labs & imaging results that were available during my care of the patient were reviewed by me and considered in my medical decision making (see chart for details).  No clear etiology for patient's abdominal pain identified CT scan revealed no gross abnormality. Patient on reevaluation states that all pain is completely resolved.       ____________________________________________  FINAL CLINICAL IMPRESSION(S) / ED DIAGNOSES  Final diagnoses:  Right lower quadrant abdominal pain     MEDICATIONS GIVEN DURING THIS VISIT:  Medications  morphine 2 MG/ML injection 2 mg (2 mg Intravenous Given 10/28/16 0044)  ondansetron (ZOFRAN) injection 4 mg (4 mg Intravenous Given 10/28/16 0044)  iopamidol (ISOVUE-300) 61 % injection 30 mL (30 mLs Oral Contrast Given 10/28/16 0103)  morphine 4 MG/ML injection 4 mg (4 mg Intravenous Given 10/28/16 0132)  iopamidol (ISOVUE-300) 61 % injection 100 mL (100 mLs Intravenous Contrast Given 10/28/16 0201)     NEW OUTPATIENT MEDICATIONS STARTED DURING THIS VISIT:  New Prescriptions   No medications on file    Modified Medications   No medications on file    Discontinued Medications   No medications on file     Note:  This document was prepared using Dragon voice recognition software and may include unintentional dictation errors.    Gregor Hams, MD 10/28/16 [phone]

## 2016-10-30 ENCOUNTER — Emergency Department
Admission: EM | Admit: 2016-10-30 | Discharge: 2016-10-31 | Disposition: A | Discharge status: Home / Self Care | Payer: Medicare HMO | Attending: Emergency Medicine | Admitting: Emergency Medicine

## 2016-10-30 ENCOUNTER — Encounter: Payer: Self-pay | Admitting: Emergency Medicine

## 2016-10-30 ENCOUNTER — Emergency Department: Payer: Medicare HMO

## 2016-10-30 DIAGNOSIS — R1030 Lower abdominal pain, unspecified: Secondary | ICD-10-CM

## 2016-10-30 DIAGNOSIS — R1031 Right lower quadrant pain: Secondary | ICD-10-CM | POA: Insufficient documentation

## 2016-10-30 DIAGNOSIS — R110 Nausea: Secondary | ICD-10-CM | POA: Diagnosis not present

## 2016-10-30 DIAGNOSIS — Z87891 Personal history of nicotine dependence: Secondary | ICD-10-CM | POA: Insufficient documentation

## 2016-10-30 DIAGNOSIS — K5900 Constipation, unspecified: Secondary | ICD-10-CM | POA: Insufficient documentation

## 2016-10-30 DIAGNOSIS — Z79899 Other long term (current) drug therapy: Secondary | ICD-10-CM | POA: Insufficient documentation

## 2016-10-30 DIAGNOSIS — I10 Essential (primary) hypertension: Secondary | ICD-10-CM | POA: Insufficient documentation

## 2016-10-30 DIAGNOSIS — E119 Type 2 diabetes mellitus without complications: Secondary | ICD-10-CM | POA: Insufficient documentation

## 2016-10-30 LAB — CBC WITH DIFFERENTIAL/PLATELET
Basophils Absolute: 0 10*3/uL (ref 0–0.1)
Basophils Relative: 1 %
EOS ABS: 0.2 10*3/uL (ref 0–0.7)
EOS PCT: 4 %
HCT: 34.9 % — ABNORMAL LOW (ref 35.0–47.0)
Hemoglobin: 12.1 g/dL (ref 12.0–16.0)
LYMPHS ABS: 1.7 10*3/uL (ref 1.0–3.6)
Lymphocytes Relative: 25 %
MCH: 29 pg (ref 26.0–34.0)
MCHC: 34.7 g/dL (ref 32.0–36.0)
MCV: 83.7 fL (ref 80.0–100.0)
MONO ABS: 1.1 10*3/uL — AB (ref 0.2–0.9)
Monocytes Relative: 17 %
Neutro Abs: 3.7 10*3/uL (ref 1.4–6.5)
Neutrophils Relative %: 53 %
PLATELETS: 318 10*3/uL (ref 150–440)
RBC: 4.17 MIL/uL (ref 3.80–5.20)
RDW: 15.3 % — AB (ref 11.5–14.5)
WBC: 6.8 10*3/uL (ref 3.6–11.0)

## 2016-10-30 LAB — URINALYSIS, COMPLETE (UACMP) WITH MICROSCOPIC
BACTERIA UA: NONE SEEN
BILIRUBIN URINE: NEGATIVE
GLUCOSE, UA: NEGATIVE mg/dL
HGB URINE DIPSTICK: NEGATIVE
KETONES UR: NEGATIVE mg/dL
LEUKOCYTES UA: NEGATIVE
NITRITE: NEGATIVE
PH: 5 (ref 5.0–8.0)
PROTEIN: NEGATIVE mg/dL
Specific Gravity, Urine: 1.019 (ref 1.005–1.030)

## 2016-10-30 LAB — COMPREHENSIVE METABOLIC PANEL
ALT: 15 U/L (ref 14–54)
ANION GAP: 7 (ref 5–15)
AST: 31 U/L (ref 15–41)
Albumin: 3.4 g/dL — ABNORMAL LOW (ref 3.5–5.0)
Alkaline Phosphatase: 74 U/L (ref 38–126)
BUN: 14 mg/dL (ref 6–20)
CHLORIDE: 100 mmol/L — AB (ref 101–111)
CO2: 29 mmol/L (ref 22–32)
CREATININE: 0.79 mg/dL (ref 0.44–1.00)
Calcium: 9.5 mg/dL (ref 8.9–10.3)
Glucose, Bld: 192 mg/dL — ABNORMAL HIGH (ref 65–99)
POTASSIUM: 4.5 mmol/L (ref 3.5–5.1)
SODIUM: 136 mmol/L (ref 135–145)
Total Bilirubin: 0.8 mg/dL (ref 0.3–1.2)
Total Protein: 7 g/dL (ref 6.5–8.1)

## 2016-10-30 MED ORDER — ONDANSETRON 4 MG PO TBDP
4.0000 mg | ORAL_TABLET | Freq: Once | ORAL | Status: AC
  Administered 2016-10-30: 4 mg via ORAL
  Filled 2016-10-30: qty 1

## 2016-10-30 MED ORDER — ONDANSETRON HCL 4 MG/2ML IJ SOLN: 4.0000 mg | Freq: Once | INTRAMUSCULAR | Status: DC

## 2016-10-30 MED ORDER — SODIUM CHLORIDE 0.9 % IV BOLUS (SEPSIS): 1000.0000 mL | Freq: Once | INTRAVENOUS | Status: DC

## 2016-10-30 MED ORDER — POLYETHYLENE GLYCOL 3350 17 GM/SCOOP PO POWD: 17.0000 g | Freq: Every day | ORAL | 0 refills | Status: AC

## 2016-10-30 MED ORDER — MORPHINE SULFATE (PF) 4 MG/ML IV SOLN: 4.0000 mg | Freq: Once | INTRAVENOUS | Status: DC

## 2016-10-30 MED ORDER — ACETAMINOPHEN 500 MG PO TABS
1000.0000 mg | ORAL_TABLET | Freq: Once | ORAL | Status: AC
  Administered 2016-10-30: 1000 mg via ORAL
  Filled 2016-10-30: qty 2

## 2016-10-30 NOTE — ED Notes (Signed)
Per Dr Alfred Levins not to give enema until labs have resulted and she has reviewed

## 2016-10-30 NOTE — ED Notes (Signed)
IV attempted x2 without results - Dr Alfred Levins is aware and stated to not stick again for IV access - blood was obtained for labs

## 2016-10-30 NOTE — ED Notes (Signed)
Pt had small results from enema - changed pt

## 2016-10-30 NOTE — ED Provider Notes (Signed)
[facility] Emergency Department Provider Note  ____________________________________________  Time seen: Approximately 7:00 PM  I have reviewed the triage vital signs and the nursing notes.   HISTORY  Chief Complaint Abdominal Pain   HPI Ruth Rhodes is a 83 y.o. female the history of diverticulosis, lung cancer, diabetes, hypertension who presents for evaluation of right lower quadrant abdominal pain. Patient was seen here 2 days ago for the same complaint. Had a CBC, CMP, and CT abdomen and pelvis with no acute findings other than constipation. She hasn't had a BM in 3 days. Reports that she usually does not have problems with constipation. She reports that she felt better after leaving the hospital 2 days ago. Today she started having pain again that she describes as sharp, intermittent, located in the right lower quadrant, nonradiating, associated with nausea, currently 9/10. No dysuria hematuria, no diarrhea, no fever or chills, no chest pain or shortness of breath, no vaginal bleeding or vaginal discharge. Patient status post abdominal hysterectomy. No prior history of SBO. No abdominal distention, no vomiting.   Past Medical History:  Diagnosis Date  . Arthritis   . Back pain   . Cancer ([place])   . Diabetes mellitus without complication ([place])   . Hypertension     Patient Active Problem List   Diagnosis Date Noted  . Mass of upper lobe of left lung 08/11/2016  . Lumbar stenosis with neurogenic claudication 09/23/2015    Past Surgical History:  Procedure Laterality Date  . ABDOMINAL HYSTERECTOMY    . LUMBAR LAMINECTOMY WITH COFLEX 2 LEVEL Bilateral 09/23/2015   Procedure: Laminectomy and Foraminotomy L4-L5 bilateral with CoFlex L5-S1 - left;  Surgeon: Karie Chimera, MD;  Location: [place] [facility];  Service: Neurosurgery;  Laterality: Bilateral;  . WOUND EXPLORATION N/A 09/27/2015   Procedure: WOUND EXPLORATION;  Surgeon: Kevan Ny Ditty, MD;   Location: [place] [facility];  Service: Neurosurgery;  Laterality: N/A;  WOUND EXPLORATION    Prior to Admission medications   Medication Sig Start Date End Date Taking? Authorizing Provider  BYSTOLIC 20 MG TABS Take 20 mg by mouth daily.  03/19/15   Historical Provider, MD  docusate sodium (COLACE) 100 MG capsule Take 2 capsules (200 mg total) by mouth 2 (two) times daily. 01/09/16   Carrie Mew, MD  gabapentin (NEURONTIN) 400 MG capsule Take 400 mg by mouth 3 (three) times daily.    Historical Provider, MD  levofloxacin (LEVAQUIN) 500 MG tablet  08/05/16   Historical Provider, MD  lisinopril (PRINIVIL,ZESTRIL) 20 MG tablet Take 20 mg by mouth daily.  03/19/15   Historical Provider, MD  methocarbamol (ROBAXIN) 500 MG tablet Take 1 tablet (500 mg total) by mouth every 6 (six) hours as needed for muscle spasms. 09/28/15   Kevan Ny Ditty, MD  oxyCODONE-acetaminophen (ROXICET) 5-325 MG tablet Take 1 tablet by mouth every 4 (four) hours as needed for severe pain. 09/28/15   Kevan Ny Ditty, MD  polyethylene glycol powder (GLYCOLAX/MIRALAX) powder Take 17 g by mouth daily. 10/30/16   Harvest Dark, MD  potassium chloride SA (KLOR-CON M20) 20 MEQ tablet Take 1 tablet (20 mEq total) by mouth daily. 02/08/16   Hinda Kehr, MD  senna (SENOKOT) 8.6 MG TABS tablet Take 2 tablets (17.2 mg total) by mouth 2 (two) times daily. 01/09/16   Carrie Mew, MD    Allergies Tramadol  No family history on file.  Social History Social History  Substance Use Topics  . Smoking status: Former Smoker  Years: 50.00    Quit date: 02/16/1986  . Smokeless tobacco: Never Used  . Alcohol use No    Review of Systems  Constitutional: Negative for fever. Eyes: Negative for visual changes. ENT: Negative for sore throat. Neck: No neck pain  Cardiovascular: Negative for chest pain. Respiratory: Negative for shortness of breath. Gastrointestinal: + RLQ abdominal pain, nausea, and constipation. No vomiting  or diarrhea. Genitourinary: Negative for dysuria. Musculoskeletal: Negative for back pain. Skin: Negative for rash. Neurological: Negative for headaches, weakness or numbness. Psych: No SI or HI  ____________________________________________   PHYSICAL EXAM:  VITAL SIGNS: ED Triage Vitals  Enc Vitals Group     BP 10/30/16 1850 119/81     Pulse Rate 10/30/16 1853 (!) 59     Resp 10/30/16 1853 15     Temp 10/30/16 1853 97.8 F (36.6 C)     Temp Source 10/30/16 1853 Oral     SpO2 10/30/16 1853 96 %     Weight 10/30/16 1854 130 lb (59 kg)     Height 10/30/16 1854 '5\' 2"'$  (1.575 m)     Head Circumference --      [place] [facility] --      Pain Score 10/30/16 1854 8     Pain Loc --      Pain Edu? --      Excl. in [place]? --     Constitutional: Alert and oriented in mild distress due to pain.  HEENT:      Head: Normocephalic and atraumatic.         Eyes: Conjunctivae are normal. Sclera is non-icteric. EOMI. PERRL      Mouth/Throat: Mucous membranes are moist.       Neck: Supple with no signs of meningismus. Cardiovascular: Regular rate and rhythm. No murmurs, gallops, or rubs. 2+ symmetrical distal pulses are present in all extremities. No JVD. Respiratory: Normal respiratory effort. Lungs are clear to auscultation bilaterally. No wheezes, crackles, or rhonchi.  Gastrointestinal: Soft, ttp over the RLQ and suprapubic regions, non distended with positive bowel sounds. No rebound or guarding Genitourinary: No CVA tenderness.Rectal exam with no impacted stool Musculoskeletal: Nontender with normal range of motion in all extremities. No edema, cyanosis, or erythema of extremities. Neurologic: Normal speech and language. Face is symmetric. Moving all extremities. No gross focal neurologic deficits are appreciated. Skin: Skin is warm, dry and intact. No rash noted. Psychiatric: Mood and affect are normal. Speech and behavior are normal.  ____________________________________________   LABS (all  labs ordered are listed, but only abnormal results are displayed)  Labs Reviewed  CBC WITH DIFFERENTIAL/PLATELET - Abnormal; Notable for the following:       Result Value   HCT 34.9 (*)    RDW 15.3 (*)    Monocytes Absolute 1.1 (*)    All other components within normal limits  COMPREHENSIVE METABOLIC PANEL - Abnormal; Notable for the following:    Chloride 100 (*)    Glucose, Bld 192 (*)    Albumin 3.4 (*)    All other components within normal limits  URINALYSIS, COMPLETE (UACMP) WITH MICROSCOPIC - Abnormal; Notable for the following:    Color, Urine YELLOW (*)    APPearance CLEAR (*)    Squamous Epithelial / LPF 0-5 (*)    All other components within normal limits   ____________________________________________  EKG  none ____________________________________________  RADIOLOGY  KUB: Unremarkable bowel gas pattern; no free intra-abdominal air seen. Small amount of stool noted in the colon. Contrast noted within  the colon, without evidence for bowel obstruction. ____________________________________________   PROCEDURES  Procedure(s) performed: None Procedures Critical Care performed:  None ____________________________________________   INITIAL IMPRESSION / ASSESSMENT AND PLAN / ED COURSE   83 y.o. female the history of diverticulosis, lung cancer, diabetes, hypertension who presents for evaluation of right lower quadrant abdominal pain x 2 days associated with constipation. Patient had a CT scan done 2 days ago that did not show any acute findings other than moderate stool burden. She still has not had a bowel movement. She does have ttp over the RLQ and suprapubic regions but abdomen is soft, +BM, no rebound or guarding. She has positive bowel sounds and nondistended abdomen and clinically does not seem obstructed. We'll repeat basic blood work including a urinalysis. Will get KUB to evaluate for stool burden. Will attempt an enema since CT 2 days ago for same pain was  negative.  Clinical Course as of Oct 31 1729  Mon Oct 30, 2016  1957 Labs and vitals are normal. Do not believe patient needs a repeat CT at this time. Will try enema and reassess. UA pending. Care transferred to Dr. Kerman Passey  [CV]    Clinical Course User Index [CV] Rudene Re, MD    Pertinent labs & imaging results that were available during my care of the patient were reviewed by me and considered in my medical decision making (see chart for details).    ____________________________________________   FINAL CLINICAL IMPRESSION(S) / ED DIAGNOSES  Final diagnoses:  Lower abdominal pain      NEW MEDICATIONS STARTED DURING THIS VISIT:  Discharge Medication List as of 10/30/2016 10:47 PM    START taking these medications   Details  polyethylene glycol powder (GLYCOLAX/MIRALAX) powder Take 17 g by mouth daily., Starting Mon 10/30/2016, Print         Note:  This document was prepared using Dragon voice recognition software and may include unintentional dictation errors.    Rudene Re, MD 10/31/16 1739

## 2016-10-30 NOTE — Discharge Instructions (Signed)
As we discussed please use her MiraLAX once daily into the multiple bowel movements. Drink plenty of fluids. Please follow-up your doctor tomorrow. Return to the emergency department if you continue to have abdominal pain in 24-48 hours. Return to the emergency department sooner for any vomiting or fever.

## 2016-10-30 NOTE — ED Notes (Signed)
In and out cath to obtain urine - pt tolerated well - Juanetta Tech assisted

## 2016-10-30 NOTE — ED Triage Notes (Signed)
Pt in via EMS with complaints of RLQ abdominal pain since yesterday, pt reports nausea, denies any vomiting/diarrhea, reports constipation with most recent bowel movement.  Pt abdomen distended upon assessment, vitals WDL.  NAD noted at this time.

## 2016-10-30 NOTE — ED Notes (Signed)
Soap sud enema given with little results - pt states she is still holding fluid - will check again in 5-10 min

## 2016-10-30 NOTE — ED Notes (Signed)
Pt turned, dried, and repositioned 

## 2016-10-30 NOTE — ED Provider Notes (Signed)
-----------------------------------------   10:44 PM on 10/30/2016 -----------------------------------------  Patient continues to state mild lower abdominal discomfort, she was able to have a small bowel movement after the enema. I discussed repeating a CT scan, the patient does not want to do that. I also discussed going home with MiraLAX and returning to the emergency department in 48 hours if she continues have any abdominal discomfort, or sooner if she develops a fever. Patient and family are agreeable.   Harvest Dark, MD 10/30/16 2245

## 2016-11-24 ENCOUNTER — Encounter: Payer: Self-pay | Admitting: Hematology and Oncology

## 2016-11-24 ENCOUNTER — Inpatient Hospital Stay: Payer: Medicare Other | Attending: Hematology and Oncology | Admitting: Hematology and Oncology

## 2016-11-24 VITALS — BP 107/72 | HR 71 | Temp 98.8°F | Resp 18 | Wt 132.0 lb

## 2016-11-24 DIAGNOSIS — R918 Other nonspecific abnormal finding of lung field: Secondary | ICD-10-CM | POA: Diagnosis not present

## 2016-11-24 DIAGNOSIS — Z87891 Personal history of nicotine dependence: Secondary | ICD-10-CM | POA: Insufficient documentation

## 2016-11-24 DIAGNOSIS — Z79899 Other long term (current) drug therapy: Secondary | ICD-10-CM | POA: Diagnosis not present

## 2016-11-24 DIAGNOSIS — Z7401 Bed confinement status: Secondary | ICD-10-CM | POA: Insufficient documentation

## 2016-11-24 DIAGNOSIS — I10 Essential (primary) hypertension: Secondary | ICD-10-CM | POA: Insufficient documentation

## 2016-11-24 DIAGNOSIS — R531 Weakness: Secondary | ICD-10-CM | POA: Diagnosis not present

## 2016-11-24 DIAGNOSIS — R110 Nausea: Secondary | ICD-10-CM | POA: Diagnosis not present

## 2016-11-24 DIAGNOSIS — R634 Abnormal weight loss: Secondary | ICD-10-CM | POA: Insufficient documentation

## 2016-11-24 DIAGNOSIS — Z9181 History of falling: Secondary | ICD-10-CM | POA: Insufficient documentation

## 2016-11-24 DIAGNOSIS — R05 Cough: Secondary | ICD-10-CM | POA: Diagnosis not present

## 2016-11-24 DIAGNOSIS — M1990 Unspecified osteoarthritis, unspecified site: Secondary | ICD-10-CM | POA: Diagnosis not present

## 2016-11-24 DIAGNOSIS — R0609 Other forms of dyspnea: Secondary | ICD-10-CM | POA: Diagnosis not present

## 2016-11-24 DIAGNOSIS — R5383 Other fatigue: Secondary | ICD-10-CM | POA: Diagnosis not present

## 2016-11-24 DIAGNOSIS — E119 Type 2 diabetes mellitus without complications: Secondary | ICD-10-CM | POA: Insufficient documentation

## 2016-11-24 NOTE — Progress Notes (Signed)
[facility]-  Cancer Center  Clinic day:  11/24/2016  Chief Complaint: Ruth Rhodes is a 82 y.o. female with a lung mass who is seen for reassessment.  HPI:  The patient was last seen in the medical oncology clinic on 08/15/2016.  At that time, she was seen for initial consultation.  CT scan revealed a 3.4 cm left upper lobe mass.  We discussed the concern for lung cancer.  We discussed PET scan and biopsy.  She has been lost to follow-up.  She presented to [facility] ER on 10/30/2016 with right lower quadrant abdominal pain.  Abdomen and pelvic CT scan on 10/28/2016 revealed no acute process.  She was constipated.  She received an enema.  She states that she has not returned back to clinic as she "had to take a break" and "think".  She is now ready to move forward.  She notes being too weak to walk in the past 3 weeks.  She denies any numbness or tingling.  She can not support her own weight.  She spends her day in bed watching television.  She can not dress on her own or take a shower without assistance.  She uses pull-ups.  She recently fell and hurt her right arm and knee.  She states that she begins a 90 day stay at [facility] today.   Past Medical History:  Diagnosis Date  . Arthritis   . Back pain   . Cancer ([facility])   . Diabetes mellitus without complication ([facility])   . Hypertension     Past Surgical History:  Procedure Laterality Date  . ABDOMINAL HYSTERECTOMY    . LUMBAR LAMINECTOMY WITH COFLEX 2 LEVEL Bilateral 09/23/2015   Procedure: Laminectomy and Foraminotomy L4-L5 bilateral with CoFlex L5-S1 - left;  Surgeon: Randy Kritzer, MD;  Location: [facility];  Service: Neurosurgery;  Laterality: Bilateral;  . WOUND EXPLORATION N/A 09/27/2015   Procedure: WOUND EXPLORATION;  Surgeon: Benjamin Jared Ditty, MD;  Location: [facility]UR[facility] OR[facility];  Service: Neurosurgery;  Laterality: N/A;  WOUND EXPLORATION    History reviewed. No pertinent family history.  Social History:   reports that she quit smoking about 30 years ago. She quit after 50.00 years of use. She has never used smokeless tobacco. She reports that she does not drink alcohol or use drugs.  She smoked a carton of cigarettes a week from age [age].  She previously drank alcohol socially.  She is retired.  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  She has 1 son, 4 grandchildren and 3 great grandchildren.  She owns her own home.  She is at home by herself at night.  She is scheduled to begin a 90 day stay at [facility] today.  The patient is accompanied by a woman today.  Allergies:  Allergies  Allergen Reactions  . Tramadol Nausea Only and Nausea And Vomiting    Current Medications: Current Outpatient Prescriptions  Medication Sig Dispense Refill  . acetaminophen (TYLENOL) 325 MG tablet Take 650 mg by mouth daily as needed.    . amLODipine (NORVASC) 10 MG tablet Take 10 mg by mouth daily.    . BYSTOLIC 20 MG TABS Take 20 mg by mouth daily.     . carvedilol (COREG) 25 MG tablet Take 25 mg by mouth daily.    . docusate sodium (COLACE) 100 MG capsule Take 2 capsules (200 mg total) by mouth 2 (two) times daily. 120 capsule 0  .   gabapentin (NEURONTIN) 400 MG capsule Take 400 mg by mouth 3 (three) times daily.    Marland Kitchen levofloxacin (LEVAQUIN) 500 MG tablet     . lisinopril (PRINIVIL,ZESTRIL) 20 MG tablet Take 20 mg by mouth daily.     . methocarbamol (ROBAXIN) 500 MG tablet Take 1 tablet (500 mg total) by mouth every 6 (six) hours as needed for muscle spasms. 120 tablet 1  . mirtazapine (REMERON) 15 MG tablet Take 15 mg by mouth every morning.    Marland Kitchen oxyCODONE-acetaminophen (ROXICET) 5-325 MG tablet Take 1 tablet by mouth every 4 (four) hours as needed for severe pain. 60 tablet 0  . polyethylene glycol powder (GLYCOLAX/MIRALAX) powder Take 17 g by mouth daily. 255 g 0  . potassium chloride SA (KLOR-CON M20) 20 MEQ tablet Take 1 tablet (20 mEq total) by mouth daily. 7 tablet 0  .  pregabalin (LYRICA) 150 MG capsule Take 150 mg by mouth at bedtime.    . senna (SENOKOT) 8.6 MG TABS tablet Take 2 tablets (17.2 mg total) by mouth 2 (two) times daily. 120 each 0  . tapentadol (NUCYNTA) 50 MG 12 hr tablet Take 50 mg by mouth 2 (two) times daily.     No current facility-administered medications for this visit.     Review of Systems:  GENERAL:  Fatigue.  No fevers or sweats.  Weight loss of 3 pounds. PERFORMANCE STATUS (ECOG):  3 HEENT:  No diplopia.  No runny nose, sore throat, mouth sores or tenderness. Lungs: Shortness of breath with exertion.  Chronic cough.  No hemoptysis. Cardiac:  No chest pain, palpitations, orthopnea, or PND. GI:  Some nausea.  No vomiting, diarrhea, constipation, melena or hematochezia.  Colonoscopy "a long time ago".  s/p appendectomy. GU:  No urgency, frequency, dysuria, or hematuria. Musculoskeletal:  Arthritis in arms and legs.  Can "only raise arms so far".  No muscle tenderness. Extremities:  No pain or swelling. Skin:  No rashes or skin changes. Neuro:  General weakness.  Has not walked in 3 weeks.  Balance poor (chronic).  No headache, numbness or weakness, or coordination issues. Endocrine:  No diabetes, thyroid issues, hot flashes or night sweats. Psych:  No mood changes, depression or anxiety. Pain:  No focal pain. Review of systems:  All other systems reviewed and found to be negative.  Physical Exam: Blood pressure 107/72, pulse 71, temperature 98.8 F (37.1 C), temperature source Tympanic, resp. rate 18, weight 132 lb (59.9 kg). GENERAL:  Elderly woman sitting comfortably in a wheelchair in the exam room in no acute distress. MENTAL STATUS:  Alert and oriented to person, place and time. HEAD:  Wearing a cap.  Curly gray hair.  Normocephalic, atraumatic, face symmetric, no Cushingoid features. EYES:  Glasses.  Hazel eyes.  Pupils equal round and reactive to light and accomodation.  No conjunctivitis or scleral icterus. ENT:   Oropharynx clear without lesion.  Tongue normal.  Lower dentures.  Mucous membranes moist.  RESPIRATORY:  Clear to auscultation without rales, wheezes or rhonchi. CARDIOVASCULAR:  Regular rate and rhythm without murmur, rub or gallop. ABDOMEN:  Soft, non-tender with active bowel sounds and no hepatosplenomegaly.  No masses. SKIN:  No rashes, ulcers or lesions. EXTREMITIES:  Decreased upper extremity range of motion.  No edema, no skin discoloration or tenderness.  No palpable cords. LYMPH NODES: No palpable cervical, supraclavicular, axillary or inguinal adenopathy  NEUROLOGICAL: Alert & oriented, cranial nerves II-XII intact; lower extremity motor strength symmetric; sensation intact.  PSYCH:  Appropriate.  No visits with results within 3 Day(s) from this visit.  Latest known visit with results is:  Admission on 10/30/2016, Discharged on 10/31/2016  Component Date Value Ref Range Status  . WBC 10/30/2016 6.8  3.6 - 11.0 K/uL Final  . RBC 10/30/2016 4.17  3.80 - 5.20 MIL/uL Final  . Hemoglobin 10/30/2016 12.1  12.0 - 16.0 g/dL Final  . HCT 10/30/2016 34.9* 35.0 - 47.0 % Final  . MCV 10/30/2016 83.7  80.0 - 100.0 fL Final  . MCH 10/30/2016 29.0  26.0 - 34.0 pg Final  . MCHC 10/30/2016 34.7  32.0 - 36.0 g/dL Final  . RDW 10/30/2016 15.3* 11.5 - 14.5 % Final  . Platelets 10/30/2016 318  150 - 440 K/uL Final  . Neutrophils Relative % 10/30/2016 53  % Final  . Neutro Abs 10/30/2016 3.7  1.4 - 6.5 K/uL Final  . Lymphocytes Relative 10/30/2016 25  % Final  . Lymphs Abs 10/30/2016 1.7  1.0 - 3.6 K/uL Final  . Monocytes Relative 10/30/2016 17  % Final  . Monocytes Absolute 10/30/2016 1.1* 0.2 - 0.9 K/uL Final  . Eosinophils Relative 10/30/2016 4  % Final  . Eosinophils Absolute 10/30/2016 0.2  0 - 0.7 K/uL Final  . Basophils Relative 10/30/2016 1  % Final  . Basophils Absolute 10/30/2016 0.0  0 - 0.1 K/uL Final  . Sodium 10/30/2016 136  135 - 145 mmol/L Final  . Potassium 10/30/2016 4.5   3.5 - 5.1 mmol/L Final  . Chloride 10/30/2016 100* 101 - 111 mmol/L Final  . CO2 10/30/2016 29  22 - 32 mmol/L Final  . Glucose, Bld 10/30/2016 192* 65 - 99 mg/dL Final  . BUN 10/30/2016 14  6 - 20 mg/dL Final  . Creatinine, Ser 10/30/2016 0.79  0.44 - 1.00 mg/dL Final  . Calcium 10/30/2016 9.5  8.9 - 10.3 mg/dL Final  . Total Protein 10/30/2016 7.0  6.5 - 8.1 g/dL Final  . Albumin 10/30/2016 3.4* 3.5 - 5.0 g/dL Final  . AST 10/30/2016 31  15 - 41 U/L Final  . ALT 10/30/2016 15  14 - 54 U/L Final  . Alkaline Phosphatase 10/30/2016 74  38 - 126 U/L Final  . Total Bilirubin 10/30/2016 0.8  0.3 - 1.2 mg/dL Final  . GFR calc non Af Amer 10/30/2016 >60  >60 mL/min Final  . GFR calc Af Amer 10/30/2016 >60  >60 mL/min Final   Comment: (NOTE) The eGFR has been calculated using the CKD EPI equation. This calculation has not been validated in all clinical situations. eGFR's persistently <60 mL/min signify possible Chronic Kidney Disease.   . Anion gap 10/30/2016 7  5 - 15 Final  . Color, Urine 10/30/2016 YELLOW* YELLOW Final  . APPearance 10/30/2016 CLEAR* CLEAR Final  . Specific Gravity, Urine 10/30/2016 1.019  1.005 - 1.030 Final  . pH 10/30/2016 5.0  5.0 - 8.0 Final  . Glucose, UA 10/30/2016 NEGATIVE  NEGATIVE mg/dL Final  . Hgb urine dipstick 10/30/2016 NEGATIVE  NEGATIVE Final  . Bilirubin Urine 10/30/2016 NEGATIVE  NEGATIVE Final  . Ketones, ur 10/30/2016 NEGATIVE  NEGATIVE mg/dL Final  . Protein, ur 10/30/2016 NEGATIVE  NEGATIVE mg/dL Final  . Nitrite 10/30/2016 NEGATIVE  NEGATIVE Final  . Leukocytes, UA 10/30/2016 NEGATIVE  NEGATIVE Final  . RBC / HPF 10/30/2016 0-5  0 - 5 RBC/hpf Final  . WBC, UA 10/30/2016 0-5  0 - 5 WBC/hpf Final  . Bacteria, UA 10/30/2016 NONE SEEN  NONE SEEN Final  .   Squamous Epithelial / LPF 10/30/2016 0-5* NONE SEEN Final  . Mucous 10/30/2016 PRESENT   Final  . Hyaline Casts, UA 10/30/2016 PRESENT   Final    Assessment:  Ruth Rhodes is a 83 y.o.  female with a left upper lobe lung mass after presenting with a fall.  She has a 40-50 pack year smoking history.  Cervical and thoracic spine CT on 08/11/2016 revealed a 3.4 x 1.7 cm spiculated left upper lung mass which had progressed from ground glass-glass opacity with evidence of new mediastinal adenopathy.   Head CT without contrast on 08/11/2016 revealed no evidence of metastatic disease.  Abdomen and pelvic CT on 10/28/2016 revealed no acute process.  She has been lost to follow-up.  She represents to medical attention.  She has been bed ridden x 3 weeks secondary to generalized weakness.  Performance status is 3.  She is scheduled to be admitted to [facility] today.  Plan: 1.  Discuss patient's thoughts about therapy.  She is interested in proceeding with a work-up.  Discuss plan to reschedule PET scan.  Discuss plan for biopsy after PET scan.  Preliminary discussion regarding treatment.   2.  Schedule PET scan. 3.  Patient to begin stay at [facility] for rehabilitation today. 4.  RTC after PET scan to discuss plan for biopsy.   Lequita Asal, MD  11/24/2016, 3:44 PM

## 2016-11-24 NOTE — Progress Notes (Signed)
Patient states she is not eating as much.  States she is having a hard time accepting her cancer diagnosis.  Recently seen in ED because she was not feeling well.

## 2016-11-30 ENCOUNTER — Ambulatory Visit
Admission: RE | Admit: 2016-11-30 | Discharge: 2016-11-30 | Disposition: A | Discharge status: Home / Self Care | Payer: Medicare Other | Source: Ambulatory Visit | Attending: Hematology and Oncology | Admitting: Hematology and Oncology

## 2016-11-30 DIAGNOSIS — R918 Other nonspecific abnormal finding of lung field: Secondary | ICD-10-CM

## 2016-12-04 ENCOUNTER — Ambulatory Visit
Admission: RE | Admit: 2016-12-04 | Discharge: 2016-12-04 | Disposition: A | Discharge status: Home / Self Care | Payer: Medicare Other | Source: Ambulatory Visit | Attending: Hematology and Oncology | Admitting: Hematology and Oncology

## 2016-12-04 DIAGNOSIS — I2510 Atherosclerotic heart disease of native coronary artery without angina pectoris: Secondary | ICD-10-CM | POA: Diagnosis not present

## 2016-12-04 DIAGNOSIS — C3492 Malignant neoplasm of unspecified part of left bronchus or lung: Secondary | ICD-10-CM | POA: Insufficient documentation

## 2016-12-04 DIAGNOSIS — C781 Secondary malignant neoplasm of mediastinum: Secondary | ICD-10-CM | POA: Insufficient documentation

## 2016-12-04 DIAGNOSIS — R918 Other nonspecific abnormal finding of lung field: Secondary | ICD-10-CM | POA: Insufficient documentation

## 2016-12-04 DIAGNOSIS — I700 Atherosclerosis of aorta: Secondary | ICD-10-CM | POA: Insufficient documentation

## 2016-12-04 LAB — GLUCOSE, CAPILLARY: Glucose-Capillary: 145 mg/dL — ABNORMAL HIGH (ref 65–99)

## 2016-12-04 MED ORDER — FLUDEOXYGLUCOSE F - 18 (FDG) INJECTION
12.9800 | Freq: Once | INTRAVENOUS | Status: AC | PRN
  Administered 2016-12-04: 12.98 via INTRAVENOUS

## 2016-12-05 ENCOUNTER — Inpatient Hospital Stay: Payer: Medicare Other | Admitting: Hematology and Oncology

## 2016-12-05 NOTE — Progress Notes (Deleted)
[facility] day:  12/05/2016  Chief Complaint: Ruth Rhodes is a 83 y.o. female with a lung mass who is seen for review of interval PET scan and discussion regarding direction of therapy.  HPI:  The patient was last seen in the medical oncology clinic on 11/24/2016.  At that time, she was interested in pursuing an evaluation of her lung mass.  She was weak and was scheduled for admission to [facility].  She wished to undergo PET scan and biopsy.  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  There was no subdiaphragmatic hypermetabolic disease.  There was soft tissue fullness with calcification and hypermetabolism anterior to the left hip is suspicious for the sequelae of prior trauma and/or iliopsoas bursitis.  Symptomatically,   Past Medical History:  Diagnosis Date  . Arthritis   . Back pain   . Cancer ([place])   . Diabetes mellitus without complication ([place])   . Hypertension     Past Surgical History:  Procedure Laterality Date  . ABDOMINAL HYSTERECTOMY    . LUMBAR LAMINECTOMY WITH COFLEX 2 LEVEL Bilateral 09/23/2015   Procedure: Laminectomy and Foraminotomy L4-L5 bilateral with CoFlex L5-S1 - left;  Surgeon: Karie Chimera, MD;  Location: [place] [facility];  Service: Neurosurgery;  Laterality: Bilateral;  . WOUND EXPLORATION N/A 09/27/2015   Procedure: WOUND EXPLORATION;  Surgeon: Kevan Ny Ditty, MD;  Location: [place] [facility];  Service: Neurosurgery;  Laterality: N/A;  WOUND EXPLORATION    No family history on file.  Social History:  reports that she quit smoking about 30 years ago. She quit after 50.00 years of use. She has never used smokeless tobacco. She reports that she does not drink alcohol or use drugs.  She smoked a carton of cigarettes a week from age [age].  She previously drank alcohol socially.  She is  retired.  She previously worked in [employer].  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  She has 1 son, 4 grandchildren and 3 great grandchildren.  She owns her own home.  She is at home by herself at night.  She is scheduled to begin a 90 day stay at [facility] today.  The patient is accompanied by a woman today.  Allergies:  Allergies  Allergen Reactions  . Tramadol Nausea Only and Nausea And Vomiting    Current Medications: Current Outpatient Prescriptions  Medication Sig Dispense Refill  . acetaminophen (TYLENOL) 325 MG tablet Take 650 mg by mouth daily as needed.    Marland Kitchen amLODipine (NORVASC) 10 MG tablet Take 10 mg by mouth daily.    Marland Kitchen BYSTOLIC 20 MG TABS Take 20 mg by mouth daily.     . carvedilol (COREG) 25 MG tablet Take 25 mg by mouth daily.    Marland Kitchen docusate sodium (COLACE) 100 MG capsule Take 2 capsules (200 mg total) by mouth 2 (two) times daily. 120 capsule 0  . gabapentin (NEURONTIN) 400 MG capsule Take 400 mg by mouth 3 (three) times daily.    Marland Kitchen levofloxacin (LEVAQUIN) 500 MG tablet     . lisinopril (PRINIVIL,ZESTRIL) 20 MG tablet Take 20 mg by mouth daily.     . methocarbamol (ROBAXIN) 500 MG tablet Take 1 tablet (500 mg total) by mouth every 6 (six) hours as needed for muscle spasms. 120 tablet 1  . mirtazapine (REMERON) 15 MG tablet Take  15 mg by mouth every morning.    Marland Kitchen oxyCODONE-acetaminophen (ROXICET) 5-325 MG tablet Take 1 tablet by mouth every 4 (four) hours as needed for severe pain. 60 tablet 0  . polyethylene glycol powder (GLYCOLAX/MIRALAX) powder Take 17 g by mouth daily. 255 g 0  . potassium chloride SA (KLOR-CON M20) 20 MEQ tablet Take 1 tablet (20 mEq total) by mouth daily. 7 tablet 0  . pregabalin (LYRICA) 150 MG capsule Take 150 mg by mouth at bedtime.    . senna (SENOKOT) 8.6 MG TABS tablet Take 2 tablets (17.2 mg total) by mouth 2 (two) times daily. 120 each 0  . tapentadol (NUCYNTA) 50 MG 12 hr tablet Take 50 mg by mouth 2 (two) times daily.     No current  facility-administered medications for this visit.     Review of Systems:  GENERAL:  Fatigue.  No fevers or sweats.  Weight loss of 3 pounds. PERFORMANCE STATUS (ECOG):  3 HEENT:  No diplopia.  No runny nose, sore throat, mouth sores or tenderness. Lungs: Shortness of breath with exertion.  Chronic cough.  No hemoptysis. Cardiac:  No chest pain, palpitations, orthopnea, or PND. GI:  Some nausea.  No vomiting, diarrhea, constipation, melena or hematochezia.  Colonoscopy "a long time ago".  s/p appendectomy. GU:  No urgency, frequency, dysuria, or hematuria. Musculoskeletal:  Arthritis in arms and legs.  Can "only raise arms so far".  No muscle tenderness. Extremities:  No pain or swelling. Skin:  No rashes or skin changes. Neuro:  General weakness.  Has not walked in 3 weeks.  Balance poor (chronic).  No headache, numbness or weakness, or coordination issues. Endocrine:  No diabetes, thyroid issues, hot flashes or night sweats. Psych:  No mood changes, depression or anxiety. Pain:  No focal pain. Review of systems:  All other systems reviewed and found to be negative.  Physical Exam: There were no vitals taken for this visit. GENERAL:  Elderly woman sitting comfortably in a wheelchair in the exam room in no acute distress. MENTAL STATUS:  Alert and oriented to person, place and time. HEAD:  Wearing a cap.  Curly gray hair.  Normocephalic, atraumatic, face symmetric, no Cushingoid features. EYES:  Glasses.  Hazel eyes.  Pupils equal round and reactive to light and accomodation.  No conjunctivitis or scleral icterus. ENT:  Oropharynx clear without lesion.  Tongue normal.  Lower dentures.  Mucous membranes moist.  RESPIRATORY:  Clear to auscultation without rales, wheezes or rhonchi. CARDIOVASCULAR:  Regular rate and rhythm without murmur, rub or gallop. ABDOMEN:  Soft, non-tender with active bowel sounds and no hepatosplenomegaly.  No masses. SKIN:  No rashes, ulcers or  lesions. EXTREMITIES:  Decreased upper extremity range of motion.  No edema, no skin discoloration or tenderness.  No palpable cords. LYMPH NODES: No palpable cervical, supraclavicular, axillary or inguinal adenopathy  NEUROLOGICAL: Alert & oriented, cranial nerves II-XII intact; lower extremity motor strength symmetric; sensation intact.  PSYCH:  Appropriate.   Hospital Outpatient Visit on 12/04/2016  Component Date Value Ref Range Status  . Glucose-Capillary 12/04/2016 145* 65 - 99 mg/dL Final    Assessment:  Ruth Rhodes is a 83 y.o. female with a left upper lobe lung mass after presenting with a fall.  She has a 40-50 pack year smoking history.  Cervical and thoracic spine CT on 08/11/2016 revealed a 3.4 x 1.7 cm spiculated left upper lung mass which had progressed from ground glass-glass opacity with evidence of new mediastinal adenopathy.  Head CT without contrast on 08/11/2016 revealed no evidence of metastatic disease.  Abdomen and pelvic CT on 10/28/2016 revealed no acute process.  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  There was no subdiaphragmatic hypermetabolic disease.  Clinical stage is [phone] (stage IV)  She has been lost to follow-up.  She represents to medical attention.  She has been bed ridden x 3 weeks secondary to generalized weakness.  Performance status is 3.  She is scheduled to be admitted to [facility] today.  Plan: 1.  Discuss results of PET scan.  Discuss   patient's thoughts about therapy.  She is interested in proceeding with a work-up.  Discuss plan to reschedule PET scan.  Discuss plan for biopsy after PET scan.  Preliminary discussion regarding treatment.   2.  Schedule PET scan. 3.  Patient to begin stay at [facility] for rehabilitation today. 4.  RTC after PET scan to discuss plan for biopsy.   Lequita Asal, MD  12/05/2016, 5:24 AM

## 2017-01-07 ENCOUNTER — Encounter: Payer: Self-pay | Admitting: *Deleted

## 2017-01-07 ENCOUNTER — Emergency Department: Payer: Medicare Other

## 2017-01-07 ENCOUNTER — Observation Stay
Admission: EM | Admit: 2017-01-07 | Discharge: 2017-01-09 | Disposition: A | Discharge status: Skilled Nursing Facility | Payer: Medicare Other | Attending: Specialist | Admitting: Specialist

## 2017-01-07 DIAGNOSIS — N3001 Acute cystitis with hematuria: Secondary | ICD-10-CM | POA: Diagnosis present

## 2017-01-07 DIAGNOSIS — R531 Weakness: Secondary | ICD-10-CM | POA: Diagnosis not present

## 2017-01-07 DIAGNOSIS — G629 Polyneuropathy, unspecified: Secondary | ICD-10-CM | POA: Diagnosis not present

## 2017-01-07 DIAGNOSIS — K5900 Constipation, unspecified: Secondary | ICD-10-CM | POA: Insufficient documentation

## 2017-01-07 DIAGNOSIS — I10 Essential (primary) hypertension: Secondary | ICD-10-CM | POA: Diagnosis not present

## 2017-01-07 DIAGNOSIS — Z66 Do not resuscitate: Secondary | ICD-10-CM | POA: Insufficient documentation

## 2017-01-07 DIAGNOSIS — F4323 Adjustment disorder with mixed anxiety and depressed mood: Secondary | ICD-10-CM | POA: Insufficient documentation

## 2017-01-07 DIAGNOSIS — E119 Type 2 diabetes mellitus without complications: Secondary | ICD-10-CM | POA: Insufficient documentation

## 2017-01-07 DIAGNOSIS — R51 Headache: Secondary | ICD-10-CM | POA: Insufficient documentation

## 2017-01-07 DIAGNOSIS — Z87891 Personal history of nicotine dependence: Secondary | ICD-10-CM | POA: Insufficient documentation

## 2017-01-07 DIAGNOSIS — N390 Urinary tract infection, site not specified: Secondary | ICD-10-CM | POA: Diagnosis present

## 2017-01-07 DIAGNOSIS — R1030 Lower abdominal pain, unspecified: Secondary | ICD-10-CM

## 2017-01-07 DIAGNOSIS — N200 Calculus of kidney: Secondary | ICD-10-CM | POA: Diagnosis not present

## 2017-01-07 DIAGNOSIS — Z888 Allergy status to other drugs, medicaments and biological substances status: Secondary | ICD-10-CM | POA: Diagnosis not present

## 2017-01-07 DIAGNOSIS — Z79899 Other long term (current) drug therapy: Secondary | ICD-10-CM | POA: Diagnosis not present

## 2017-01-07 DIAGNOSIS — C3490 Malignant neoplasm of unspecified part of unspecified bronchus or lung: Secondary | ICD-10-CM | POA: Insufficient documentation

## 2017-01-07 LAB — COMPREHENSIVE METABOLIC PANEL
ALT: 15 U/L (ref 14–54)
ANION GAP: 9 (ref 5–15)
AST: 22 U/L (ref 15–41)
Albumin: 3.9 g/dL (ref 3.5–5.0)
Alkaline Phosphatase: 102 U/L (ref 38–126)
BUN: 15 mg/dL (ref 6–20)
CHLORIDE: 98 mmol/L — AB (ref 101–111)
CO2: 31 mmol/L (ref 22–32)
CREATININE: 0.83 mg/dL (ref 0.44–1.00)
Calcium: 10.2 mg/dL (ref 8.9–10.3)
Glucose, Bld: 232 mg/dL — ABNORMAL HIGH (ref 65–99)
POTASSIUM: 3.9 mmol/L (ref 3.5–5.1)
SODIUM: 138 mmol/L (ref 135–145)
Total Bilirubin: 0.5 mg/dL (ref 0.3–1.2)
Total Protein: 8.3 g/dL — ABNORMAL HIGH (ref 6.5–8.1)

## 2017-01-07 LAB — CBC WITH DIFFERENTIAL/PLATELET
Basophils Absolute: 0 10*3/uL (ref 0–0.1)
Basophils Relative: 1 %
EOS ABS: 0.3 10*3/uL (ref 0–0.7)
EOS PCT: 4 %
HCT: 38.1 % (ref 35.0–47.0)
Hemoglobin: 13 g/dL (ref 12.0–16.0)
LYMPHS ABS: 1.8 10*3/uL (ref 1.0–3.6)
LYMPHS PCT: 27 %
MCH: 28 pg (ref 26.0–34.0)
MCHC: 34.2 g/dL (ref 32.0–36.0)
MCV: 82 fL (ref 80.0–100.0)
MONO ABS: 0.7 10*3/uL (ref 0.2–0.9)
Monocytes Relative: 11 %
Neutro Abs: 3.7 10*3/uL (ref 1.4–6.5)
Neutrophils Relative %: 57 %
PLATELETS: 393 10*3/uL (ref 150–440)
RBC: 4.64 MIL/uL (ref 3.80–5.20)
RDW: 15.6 % — AB (ref 11.5–14.5)
WBC: 6.5 10*3/uL (ref 3.6–11.0)

## 2017-01-07 LAB — URINALYSIS, COMPLETE (UACMP) WITH MICROSCOPIC
Bilirubin Urine: NEGATIVE
GLUCOSE, UA: NEGATIVE mg/dL
Hgb urine dipstick: NEGATIVE
Ketones, ur: NEGATIVE mg/dL
Nitrite: NEGATIVE
PH: 5 (ref 5.0–8.0)
Protein, ur: NEGATIVE mg/dL
SPECIFIC GRAVITY, URINE: 1.016 (ref 1.005–1.030)

## 2017-01-07 LAB — LIPASE, BLOOD: Lipase: 20 U/L (ref 11–51)

## 2017-01-07 LAB — GLUCOSE, CAPILLARY: Glucose-Capillary: 325 mg/dL — ABNORMAL HIGH (ref 65–99)

## 2017-01-07 MED ORDER — POTASSIUM CHLORIDE CRYS ER 20 MEQ PO TBCR
20.0000 meq | EXTENDED_RELEASE_TABLET | Freq: Every day | ORAL | Status: DC
  Administered 2017-01-08 – 2017-01-09 (×2): 20 meq via ORAL
  Filled 2017-01-07 (×2): qty 1

## 2017-01-07 MED ORDER — NEBIVOLOL HCL 20 MG PO TABS: 20.0000 mg | ORAL_TABLET | Freq: Every day | ORAL | Status: DC

## 2017-01-07 MED ORDER — CEFTRIAXONE SODIUM-DEXTROSE 1-3.74 GM-% IV SOLR
1.0000 g | Freq: Once | INTRAVENOUS | Status: AC
  Administered 2017-01-07: 1 g via INTRAVENOUS
  Filled 2017-01-07: qty 50

## 2017-01-07 MED ORDER — PREGABALIN 75 MG PO CAPS
150.0000 mg | ORAL_CAPSULE | Freq: Every day | ORAL | Status: DC
  Administered 2017-01-07: 150 mg via ORAL
  Filled 2017-01-07: qty 2

## 2017-01-07 MED ORDER — ALBUTEROL SULFATE (2.5 MG/3ML) 0.083% IN NEBU: 2.5000 mg | INHALATION_SOLUTION | RESPIRATORY_TRACT | Status: DC | PRN

## 2017-01-07 MED ORDER — OXYCODONE-ACETAMINOPHEN 5-325 MG PO TABS: 1.0000 | ORAL_TABLET | ORAL | Status: DC | PRN

## 2017-01-07 MED ORDER — ENOXAPARIN SODIUM 40 MG/0.4ML ~~LOC~~ SOLN
40.0000 mg | SUBCUTANEOUS | Status: DC
  Administered 2017-01-07 – 2017-01-08 (×2): 40 mg via SUBCUTANEOUS
  Filled 2017-01-07 (×2): qty 0.4

## 2017-01-07 MED ORDER — SENNOSIDES-DOCUSATE SODIUM 8.6-50 MG PO TABS: 1.0000 | ORAL_TABLET | Freq: Every evening | ORAL | Status: DC | PRN

## 2017-01-07 MED ORDER — DEXTROSE 5 % IV SOLN: 1.0000 g | INTRAVENOUS | Status: DC

## 2017-01-07 MED ORDER — BISACODYL 5 MG PO TBEC: 5.0000 mg | DELAYED_RELEASE_TABLET | Freq: Every day | ORAL | Status: DC | PRN

## 2017-01-07 MED ORDER — POLYETHYLENE GLYCOL 3350 17 GM/SCOOP PO POWD
17.0000 g | Freq: Every day | ORAL | Status: DC
  Filled 2017-01-07: qty 255

## 2017-01-07 MED ORDER — LISINOPRIL 20 MG PO TABS
20.0000 mg | ORAL_TABLET | Freq: Every day | ORAL | Status: DC
  Administered 2017-01-08 – 2017-01-09 (×2): 20 mg via ORAL
  Filled 2017-01-07 (×2): qty 1

## 2017-01-07 MED ORDER — MIRTAZAPINE 15 MG PO TABS
15.0000 mg | ORAL_TABLET | Freq: Every day | ORAL | Status: DC
  Administered 2017-01-07 – 2017-01-08 (×2): 15 mg via ORAL
  Filled 2017-01-07 (×2): qty 1

## 2017-01-07 MED ORDER — DEXTROSE 5 % IV SOLN: 1.0000 g | Freq: Once | INTRAVENOUS | Status: DC

## 2017-01-07 MED ORDER — POLYETHYLENE GLYCOL 3350 17 G PO PACK
17.0000 g | PACK | Freq: Every day | ORAL | Status: DC
  Administered 2017-01-08 – 2017-01-09 (×2): 17 g via ORAL
  Filled 2017-01-07 (×2): qty 1

## 2017-01-07 MED ORDER — ONDANSETRON HCL 4 MG PO TABS: 4.0000 mg | ORAL_TABLET | Freq: Four times a day (QID) | ORAL | Status: DC | PRN

## 2017-01-07 MED ORDER — CARVEDILOL 6.25 MG PO TABS
25.0000 mg | ORAL_TABLET | Freq: Two times a day (BID) | ORAL | Status: DC
  Administered 2017-01-07 – 2017-01-09 (×4): 25 mg via ORAL
  Filled 2017-01-07 (×4): qty 4

## 2017-01-07 MED ORDER — INSULIN ASPART 100 UNIT/ML ~~LOC~~ SOLN
0.0000 [IU] | Freq: Every day | SUBCUTANEOUS | Status: DC
  Administered 2017-01-07: 21:00:00 4 [IU] via SUBCUTANEOUS
  Administered 2017-01-08: 23:00:00 2 [IU] via SUBCUTANEOUS
  Filled 2017-01-07: qty 4
  Filled 2017-01-07: qty 2

## 2017-01-07 MED ORDER — ACETAMINOPHEN 325 MG PO TABS
650.0000 mg | ORAL_TABLET | Freq: Four times a day (QID) | ORAL | Status: DC | PRN
  Administered 2017-01-08: 09:00:00 650 mg via ORAL
  Filled 2017-01-07: qty 2

## 2017-01-07 MED ORDER — ONDANSETRON HCL 4 MG/2ML IJ SOLN: 4.0000 mg | Freq: Four times a day (QID) | INTRAMUSCULAR | Status: DC | PRN

## 2017-01-07 MED ORDER — DOCUSATE SODIUM 100 MG PO CAPS
200.0000 mg | ORAL_CAPSULE | Freq: Two times a day (BID) | ORAL | Status: DC
  Administered 2017-01-07 – 2017-01-09 (×4): 200 mg via ORAL
  Filled 2017-01-07 (×4): qty 2

## 2017-01-07 MED ORDER — ACETAMINOPHEN 650 MG RE SUPP: 650.0000 mg | Freq: Four times a day (QID) | RECTAL | Status: DC | PRN

## 2017-01-07 MED ORDER — TAPENTADOL HCL ER 50 MG PO TB12: 50.0000 mg | ORAL_TABLET | Freq: Two times a day (BID) | ORAL | Status: DC

## 2017-01-07 MED ORDER — SENNA 8.6 MG PO TABS
2.0000 | ORAL_TABLET | Freq: Two times a day (BID) | ORAL | Status: DC
  Administered 2017-01-07 – 2017-01-09 (×4): 17.2 mg via ORAL
  Filled 2017-01-07 (×4): qty 2

## 2017-01-07 MED ORDER — DEXTROSE 5 % IV SOLN
1.0000 g | INTRAVENOUS | Status: DC
  Administered 2017-01-08: 19:00:00 1 g via INTRAVENOUS
  Filled 2017-01-07 (×2): qty 10

## 2017-01-07 MED ORDER — INSULIN ASPART 100 UNIT/ML ~~LOC~~ SOLN
0.0000 [IU] | Freq: Three times a day (TID) | SUBCUTANEOUS | Status: DC
Start: 2017-01-08 — End: 2017-01-09
  Administered 2017-01-08: 2 [IU] via SUBCUTANEOUS
  Administered 2017-01-08: 09:00:00 3 [IU] via SUBCUTANEOUS
  Administered 2017-01-08: 13:00:00 5 [IU] via SUBCUTANEOUS
  Administered 2017-01-09 (×2): 3 [IU] via SUBCUTANEOUS
  Filled 2017-01-07: qty 5
  Filled 2017-01-07: qty 2
  Filled 2017-01-07 (×3): qty 3

## 2017-01-07 MED ORDER — AMLODIPINE BESYLATE 10 MG PO TABS
10.0000 mg | ORAL_TABLET | Freq: Every day | ORAL | Status: DC
  Administered 2017-01-08 – 2017-01-09 (×2): 10 mg via ORAL
  Filled 2017-01-07 (×2): qty 1

## 2017-01-07 MED ORDER — GABAPENTIN 400 MG PO CAPS
400.0000 mg | ORAL_CAPSULE | Freq: Three times a day (TID) | ORAL | Status: DC
  Administered 2017-01-08 – 2017-01-09 (×4): 400 mg via ORAL
  Filled 2017-01-07 (×4): qty 1

## 2017-01-07 NOTE — ED Notes (Signed)
Bed pan offered, pt had a small bowel movement  Lm edt

## 2017-01-07 NOTE — ED Notes (Signed)
Patient taken to CT scan.

## 2017-01-07 NOTE — ED Triage Notes (Signed)
Per EMS report, patient c/o lower abdominal pain for one week. Patient states her last BM was 4/21. Patient lives by self, uses wheelchair. Patient is tearful, states she can't take care of herself any longer and family is not available.

## 2017-01-07 NOTE — ED Provider Notes (Signed)
[facility] Emergency Department Provider Note   ____________________________________________   I have reviewed the triage vital signs and the nursing notes.   HISTORY  Chief Complaint Headache  History limited by: Not Limited   HPI Ruth Rhodes is a 83 y.o. female who presents to the emergency department today with primary concern of headache. Patient states she has had a headache for 3 days. It has been accompanied by some nausea. Patient does state that she also has felt depressed recently. She states this stems from a recent diagnosis of lung cancer. The patient additionally says she has been having some on and off again lower abdominal pain. The patient additionally states that she would like to be placed in a facility although she has not talked her primary care doctor about this.   Past Medical History:  Diagnosis Date  . Arthritis   . Back pain   . Cancer ([place])    Lung  . Diabetes mellitus without complication ([place])   . Hypertension     Patient Active Problem List   Diagnosis Date Noted  . Mass of upper lobe of left lung 08/11/2016  . Lumbar stenosis with neurogenic claudication 09/23/2015    Past Surgical History:  Procedure Laterality Date  . ABDOMINAL HYSTERECTOMY    . LUMBAR LAMINECTOMY WITH COFLEX 2 LEVEL Bilateral 09/23/2015   Procedure: Laminectomy and Foraminotomy L4-L5 bilateral with CoFlex L5-S1 - left;  Surgeon: Karie Chimera, MD;  Location: [place] [facility];  Service: Neurosurgery;  Laterality: Bilateral;  . WOUND EXPLORATION N/A 09/27/2015   Procedure: WOUND EXPLORATION;  Surgeon: Kevan Ny Ditty, MD;  Location: [place] [facility];  Service: Neurosurgery;  Laterality: N/A;  WOUND EXPLORATION    Prior to Admission medications   Medication Sig Start Date End Date Taking? Authorizing Provider  acetaminophen (TYLENOL) 325 MG tablet Take 650 mg by mouth daily as needed. 09/12/16 09/12/17  Historical Provider, MD  amLODipine (NORVASC) 10  MG tablet Take 10 mg by mouth daily. 10/02/16 10/02/17  Historical Provider, MD  BYSTOLIC 20 MG TABS Take 20 mg by mouth daily.  03/19/15   Historical Provider, MD  carvedilol (COREG) 25 MG tablet Take 25 mg by mouth daily. 10/02/16   Historical Provider, MD  docusate sodium (COLACE) 100 MG capsule Take 2 capsules (200 mg total) by mouth 2 (two) times daily. 01/09/16   Carrie Mew, MD  gabapentin (NEURONTIN) 400 MG capsule Take 400 mg by mouth 3 (three) times daily.    Historical Provider, MD  levofloxacin (LEVAQUIN) 500 MG tablet  08/05/16   Historical Provider, MD  lisinopril (PRINIVIL,ZESTRIL) 20 MG tablet Take 20 mg by mouth daily.  03/19/15   Historical Provider, MD  methocarbamol (ROBAXIN) 500 MG tablet Take 1 tablet (500 mg total) by mouth every 6 (six) hours as needed for muscle spasms. 09/28/15   Kevan Ny Ditty, MD  mirtazapine (REMERON) 15 MG tablet Take 15 mg by mouth every morning.    Historical Provider, MD  oxyCODONE-acetaminophen (ROXICET) 5-325 MG tablet Take 1 tablet by mouth every 4 (four) hours as needed for severe pain. 09/28/15   Kevan Ny Ditty, MD  polyethylene glycol powder (GLYCOLAX/MIRALAX) powder Take 17 g by mouth daily. 10/30/16   Harvest Dark, MD  potassium chloride SA (KLOR-CON M20) 20 MEQ tablet Take 1 tablet (20 mEq total) by mouth daily. 02/08/16   Hinda Kehr, MD  pregabalin (LYRICA) 150 MG capsule Take 150 mg by mouth at bedtime.    Historical Provider, MD  senna (SENOKOT) 8.6 MG TABS tablet Take 2 tablets (17.2 mg total) by mouth 2 (two) times daily. 01/09/16   Carrie Mew, MD  tapentadol (NUCYNTA) 50 MG 12 hr tablet Take 50 mg by mouth 2 (two) times daily. 07/27/15   Historical Provider, MD    Allergies Tramadol  No family history on file.  Social History Social History  Substance Use Topics  . Smoking status: Former Smoker    Years: 50.00    Quit date: 02/16/1986  . Smokeless tobacco: Never Used  . Alcohol use No    Review of  Systems  Constitutional: Negative for fever. Cardiovascular: Negative for chest pain. Respiratory: Negative for shortness of breath. Gastrointestinal: Positive for abdominal pain and nausea Neurological: Positive for headache. 10-point ROS otherwise negative.  ____________________________________________   PHYSICAL EXAM:  VITAL SIGNS: ED Triage Vitals  Enc Vitals Group     BP 01/07/17 1232 117/63     Pulse Rate 01/07/17 1232 65     Resp 01/07/17 1232 14     Temp 01/07/17 1232 97.8 F (36.6 C)     Temp Source 01/07/17 1232 Oral     SpO2 01/07/17 1232 99 %     Weight 01/07/17 1232 154 lb 5.2 oz (70 kg)     Height 01/07/17 1232 '5\' 2"'$  (1.575 m)     Head Circumference --      [facility] --      Pain Score 01/07/17 1231 9    Constitutional: Alert and oriented. Well appearing and in no distress. Eyes: Conjunctivae are normal. Normal extraocular movements. ENT   Head: Normocephalic and atraumatic.   Nose: No congestion/rhinnorhea.   Mouth/Throat: Mucous membranes are moist.   Neck: No stridor. Hematological/Lymphatic/Immunilogical: No cervical lymphadenopathy. Cardiovascular: Normal rate, regular rhythm.  No murmurs, rubs, or gallops. Respiratory: Normal respiratory effort without tachypnea nor retractions. Breath sounds are clear and equal bilaterally. No wheezes/rales/rhonchi. Gastrointestinal: Soft and non tender. No rebound. No guarding.  Genitourinary: Deferred Musculoskeletal: Normal range of motion in all extremities. No lower extremity edema. Neurologic:  Normal speech and language. No gross focal neurologic deficits are appreciated.  Skin:  Skin is warm, dry and intact. No rash noted. Psychiatric: Mood and affect are normal. Speech and behavior are normal. Patient exhibits appropriate insight and judgment.  ____________________________________________    LABS (pertinent positives/negatives)  WBC 6.5 Hgb 12.0 Platelets 393  Cr  0.83 ____________________________________________   EKG  None  ____________________________________________    RADIOLOGY  None  ____________________________________________   PROCEDURES  Procedures  ____________________________________________   INITIAL IMPRESSION / ASSESSMENT AND PLAN / ED COURSE  Pertinent labs & imaging results that were available during my care of the patient were reviewed by me and considered in my medical decision making (see chart for details).  Patient presented to the emergency department today because of concerns primarily for weakness. Will plan on getting broad lab work and check urine.  ____________________________________________   FINAL CLINICAL IMPRESSION(S) / ED DIAGNOSES  Final diagnoses:  Weakness  Acute cystitis with hematuria  Lower abdominal pain     Note: This dictation was prepared with Dragon dictation. Any transcriptional errors that result from this process are unintentional     Nance Pear, MD 01/08/17 1235

## 2017-01-07 NOTE — ED Provider Notes (Signed)
IMPRESSION: 1. Punctate nonobstructing left kidney stones.  No hydronephrosis. 2. Increased stool throughout the colon probably represents constipation. Mild fat stranding within the mesorectal fat may represent a component of rectal stercoral colitis. 3. Stable additional chronic findings as above.  Assumed care from Dr. Archie Balboa. Patient with acute cystitis and weakness. I will discuss with the hospitalist for admission.   Earleen Newport, MD 01/07/17 [phone]

## 2017-01-07 NOTE — Progress Notes (Signed)
CH responded to an OR for an AD. [place] educated the Pt on the AD. Pt wishes to review the document before completion. Pt spent some time speaking to her cancer and stating that she is afraid. CH offered the ministry of empathetic listening and prayer. Pt seemed comforted. CH is available for follow up if needed.     01/07/17 2100  Clinical Encounter Type  Visited With Patient  Visit Type Initial;Spiritual support  Referral From Nurse  Consult/Referral To Chaplain  Spiritual Encounters  Spiritual Needs Literature;Prayer

## 2017-01-07 NOTE — H&P (Signed)
[facility] [place] NAME: Ruth Rhodes    MR#:  [id number]  DATE OF BIRTH:  April 24, 1934  DATE OF ADMISSION:  01/07/2017  PRIMARY CARE PHYSICIAN: Juluis Pitch, MD   REQUESTING/REFERRING PHYSICIAN: Earleen Newport, MD  CHIEF COMPLAINT:   Chief Complaint  Patient presents with  . Abdominal Pain   Headache, fever and chills 3 days, Generalized weakness and lower abdominal pain HISTORY OF PRESENT ILLNESS:  Ruth Rhodes  is a 83 y.o. female with a known history of Hypertension, diabetes and lung cancer. The patient to present to the ED with the above chief complaints. She also complains of nausea, urinary frequency and urgency, and generalized weakness. She was found UTI and given Rocephin in the ED. Since the patient complains of generalized weakness, ED physician request observation. PAST MEDICAL HISTORY:   Past Medical History:  Diagnosis Date  . Arthritis   . Back pain   . Cancer ([place])    Lung  . Diabetes mellitus without complication ([place])   . Hypertension     PAST SURGICAL HISTORY:   Past Surgical History:  Procedure Laterality Date  . ABDOMINAL HYSTERECTOMY    . LUMBAR LAMINECTOMY WITH COFLEX 2 LEVEL Bilateral 09/23/2015   Procedure: Laminectomy and Foraminotomy L4-L5 bilateral with CoFlex L5-S1 - left;  Surgeon: Karie Chimera, MD;  Location: [facility];  Service: Neurosurgery;  Laterality: Bilateral;  . WOUND EXPLORATION N/A 09/27/2015   Procedure: WOUND EXPLORATION;  Surgeon: Kevan Ny Ditty, MD;  Location: [facility];  Service: Neurosurgery;  Laterality: N/A;  WOUND EXPLORATION    SOCIAL HISTORY:   Social History  Substance Use Topics  . Smoking status: Former Smoker    Years: 50.00    Quit date: 02/16/1986  . Smokeless tobacco: Never Used  . Alcohol use No    FAMILY HISTORY:   Family History  Problem Relation Age of Onset  . Cancer Mother   . Heart failure Father   . Heart attack Sister   .  Hypertension Sister     DRUG ALLERGIES:   Allergies  Allergen Reactions  . Tramadol Nausea Only and Nausea And Vomiting    REVIEW OF SYSTEMS:   Review of Systems  Constitutional: Positive for chills, fever and malaise/fatigue.  HENT: Negative for congestion.   Eyes: Negative for blurred vision and double vision.  Respiratory: Negative for cough, hemoptysis, sputum production, shortness of breath, wheezing and stridor.   Cardiovascular: Negative for chest pain, palpitations and leg swelling.  Gastrointestinal: Positive for abdominal pain and nausea. Negative for blood in stool, melena and vomiting.       Lower abdominal pain. Dark stool.  Genitourinary: Positive for frequency and urgency. Negative for dysuria, flank pain and hematuria.  Musculoskeletal: Negative for back pain.  Skin: Negative for itching and rash.  Neurological: Positive for weakness and headaches. Negative for dizziness, focal weakness and loss of consciousness.  Psychiatric/Behavioral: Positive for depression. The patient is not nervous/anxious.     MEDICATIONS AT HOME:   Prior to Admission medications   Medication Sig Start Date End Date Taking? Authorizing Provider  acetaminophen (TYLENOL) 325 MG tablet Take 650 mg by mouth daily as needed. 09/12/16 09/12/17 Yes Historical Provider, MD  amLODipine (NORVASC) 10 MG tablet Take 10 mg by mouth daily. 10/02/16 10/02/17 Yes Historical Provider, MD  carvedilol (COREG) 25 MG tablet Take 25 mg by mouth daily.  10/02/16  Yes Historical Provider, MD  docusate sodium (COLACE) 100  MG capsule Take 2 capsules (200 mg total) by mouth 2 (two) times daily. 01/09/16  Yes Carrie Mew, MD  gabapentin (NEURONTIN) 400 MG capsule Take 400 mg by mouth 3 (three) times daily.   Yes Historical Provider, MD  lisinopril (PRINIVIL,ZESTRIL) 20 MG tablet Take 20 mg by mouth daily.  03/19/15  Yes Historical Provider, MD  oxyCODONE-acetaminophen (ROXICET) 5-325 MG tablet Take 1 tablet by mouth  every 4 (four) hours as needed for severe pain. 09/28/15  Yes Kevan Ny Ditty, MD  polyethylene glycol powder (GLYCOLAX/MIRALAX) powder Take 17 g by mouth daily. 10/30/16  Yes Harvest Dark, MD  potassium chloride SA (KLOR-CON M20) 20 MEQ tablet Take 1 tablet (20 mEq total) by mouth daily. 02/08/16  Yes Hinda Kehr, MD  pregabalin (LYRICA) 150 MG capsule Take 150 mg by mouth at bedtime.   Yes Historical Provider, MD  senna (SENOKOT) 8.6 MG TABS tablet Take 2 tablets (17.2 mg total) by mouth 2 (two) times daily. 01/09/16  Yes Carrie Mew, MD  BYSTOLIC 20 MG TABS Take 20 mg by mouth daily.  03/19/15   Historical Provider, MD  levofloxacin (LEVAQUIN) 500 MG tablet  08/05/16   Historical Provider, MD  methocarbamol (ROBAXIN) 500 MG tablet Take 1 tablet (500 mg total) by mouth every 6 (six) hours as needed for muscle spasms. Patient not taking: Reported on 01/07/2017 09/28/15   Kevan Ny Ditty, MD  mirtazapine (REMERON) 15 MG tablet Take 15 mg by mouth every morning.    Historical Provider, MD  tapentadol (NUCYNTA) 50 MG 12 hr tablet Take 50 mg by mouth 2 (two) times daily. 07/27/15   Historical Provider, MD      VITAL SIGNS:  Blood pressure (!) 148/68, pulse 75, temperature 97.8 F (36.6 C), temperature source Oral, resp. rate 15, height '5\' 2"'$  (1.575 m), weight 154 lb 5.2 oz (70 kg), SpO2 98 %.  PHYSICAL EXAMINATION:  Physical Exam  GENERAL:  83 y.o.-year-old patient lying in the bed with no acute distress.  EYES: Pupils equal, round, reactive to light and accommodation. No scleral icterus. Extraocular muscles intact.  HEENT: Head atraumatic, normocephalic. Oropharynx and nasopharynx clear.  NECK:  Supple, no jugular venous distention. No thyroid enlargement, no tenderness.  LUNGS: Normal breath sounds bilaterally, no wheezing, rales,rhonchi or crepitation. No use of accessory muscles of respiration.  CARDIOVASCULAR: S1, S2 normal. No murmurs, rubs, or gallops.  ABDOMEN: Soft,  nontender, nondistended. Bowel sounds present. No organomegaly or mass.  EXTREMITIES: No pedal edema, cyanosis, or clubbing.  NEUROLOGIC: Cranial nerves II through XII are intact. Muscle strength 2/5 in lower extremities. Sensation intact. Gait not checked.  PSYCHIATRIC: The patient is alert and oriented x 3.  SKIN: No obvious rash, lesion, or ulcer.   LABORATORY PANEL:   CBC  Recent Labs Lab 01/07/17 1421  WBC 6.5  HGB 13.0  HCT 38.1  PLT 393   ------------------------------------------------------------------------------------------------------------------  Chemistries   Recent Labs Lab 01/07/17 1421  NA 138  K 3.9  CL 98*  CO2 31  GLUCOSE 232*  BUN 15  CREATININE 0.83  CALCIUM 10.2  AST 22  ALT 15  ALKPHOS 102  BILITOT 0.5   ------------------------------------------------------------------------------------------------------------------  Cardiac Enzymes No results for input(s): TROPONINI in the last 168 hours. ------------------------------------------------------------------------------------------------------------------  RADIOLOGY:  Ct Head Wo Contrast  Result Date: 01/07/2017 CLINICAL DATA:  Progressive weakness. Recently diagnosed lung cancer. Headache EXAM: CT HEAD WITHOUT CONTRAST TECHNIQUE: Contiguous axial images were obtained from the base of the skull through the vertex without  intravenous contrast. COMPARISON:  08/11/2016. FINDINGS: Brain: Diffusely enlarged ventricles and subarachnoid spaces. Patchy white matter low density in both cerebral hemispheres. No intracranial hemorrhage, mass lesion or CT evidence of acute infarction. Vascular: No hyperdense vessel or unexpected calcification. Skull: Normal. Negative for fracture or focal lesion. Sinuses/Orbits: Unremarkable. Other: None. IMPRESSION: 1. No acute abnormality. 2. Stable atrophy and chronic small vessel white matter ischemic changes. Electronically Signed   By: Claudie Revering M.D.   On: 01/07/2017  13:33   Ct Renal Stone Study  Result Date: 01/07/2017 CLINICAL DATA:  83 y/o F; lower abdominal and flank pain. History of lung cancer. EXAM: CT ABDOMEN AND PELVIS WITHOUT CONTRAST TECHNIQUE: Multidetector CT imaging of the abdomen and pelvis was performed following the standard protocol without IV contrast. COMPARISON:  12/04/2016 PET-CT. FINDINGS: Lower chest: No acute abnormality. Hepatobiliary: Stable cyst adjacent to gallbladder fossa and of lower tip of right lobe of liver. No other focal liver abnormality is seen. No gallstones, gallbladder wall thickening, or biliary dilatation. Pancreas: Unremarkable. No pancreatic ductal dilatation or surrounding inflammatory changes. Spleen: Normal in size without focal abnormality. Adrenals/Urinary Tract: Punctate nonobstructing left-sided caliceal stones are present. There is a left kidney lower pole parapelvic cyst measuring 26 mm. No hydronephrosis. No urinary stone identified. Under distended bladder. Normal adrenal glands. Stomach/Bowel: No obstructive changes of bowel. Increase volume of stool throughout the colon. There is mild fat stranding within the mesorectal fat. Vascular/Lymphatic: Aortic atherosclerosis. No enlarged abdominal or pelvic lymph nodes. Reproductive: Status post hysterectomy. No adnexal masses. Other: Small paraumbilical hernia containing fat. Musculoskeletal: Stable lumbar degenerative changes with grade 1 L4-5 anterolisthesis and fusion of the L4-5 spinous processes. Stable T12 superior endplate deformity with mild anterior loss of height of the vertebral body. No acute osseous abnormality is evident. IMPRESSION: 1. Punctate nonobstructing left kidney stones.  No hydronephrosis. 2. Increased stool throughout the colon probably represents constipation. Mild fat stranding within the mesorectal fat may represent a component of rectal stercoral colitis. 3. Stable additional chronic findings as above. Electronically Signed   By: Kristine Garbe M.D.   On: 01/07/2017 17:11      IMPRESSION AND PLAN:   UTI. Observation. Rocephin iv, Follow-up urine culture. Punctate nonobstructing left kidney stones. Generalized weakness. Physical therapy evaluation. Hypertension. Continue home hypertension medication. Diabetes. I'll start sliding scale. Depression. Psych consult. Continue Remeron.  All the records are reviewed and case discussed with ED provider. Management plans discussed with the patient, family and they are in agreement.  CODE STATUS: DO NOT RESUSCITATE.  TOTAL TIME TAKING CARE OF THIS PATIENT: 48 minutes.    Demetrios Loll M.D on 01/07/2017 at 6:32 PM  Between 7am to 6pm - Pager - [phone]  After 6pm go to [email or web address] - Proofreader  [facility]  Office  [phone]  CC: Primary care physician; Juluis Pitch, MD   Note: This dictation was prepared with Dragon dictation along with smaller phrase technology. Any transcriptional errors that result from this process are unintentional.

## 2017-01-08 DIAGNOSIS — F4323 Adjustment disorder with mixed anxiety and depressed mood: Secondary | ICD-10-CM

## 2017-01-08 LAB — CBC
HCT: 33.8 % — ABNORMAL LOW (ref 35.0–47.0)
HEMOGLOBIN: 11.5 g/dL — AB (ref 12.0–16.0)
MCH: 28.1 pg (ref 26.0–34.0)
MCHC: 34 g/dL (ref 32.0–36.0)
MCV: 82.6 fL (ref 80.0–100.0)
Platelets: 356 10*3/uL (ref 150–440)
RBC: 4.09 MIL/uL (ref 3.80–5.20)
RDW: 15.6 % — AB (ref 11.5–14.5)
WBC: 5.8 10*3/uL (ref 3.6–11.0)

## 2017-01-08 LAB — GLUCOSE, CAPILLARY
GLUCOSE-CAPILLARY: 253 mg/dL — AB (ref 65–99)
GLUCOSE-CAPILLARY: 159 mg/dL — AB (ref 65–99)
GLUCOSE-CAPILLARY: 215 mg/dL — AB (ref 65–99)
Glucose-Capillary: 211 mg/dL — ABNORMAL HIGH (ref 65–99)

## 2017-01-08 LAB — BASIC METABOLIC PANEL
Anion gap: 6 (ref 5–15)
BUN: 13 mg/dL (ref 6–20)
CALCIUM: 9.6 mg/dL (ref 8.9–10.3)
CO2: 30 mmol/L (ref 22–32)
Chloride: 100 mmol/L — ABNORMAL LOW (ref 101–111)
Creatinine, Ser: 0.59 mg/dL (ref 0.44–1.00)
GFR calc Af Amer: >60 mL/min (ref >60)
GLUCOSE: 214 mg/dL — AB (ref 65–99)
Potassium: 4 mmol/L (ref 3.5–5.1)
Sodium: 136 mmol/L (ref 135–145)

## 2017-01-08 NOTE — Progress Notes (Signed)
[facility] NAME: Ruth Rhodes    MR#:  [id number]  DATE OF BIRTH:  1933-09-23  SUBJECTIVE:   Pt. Here due to Abdominal pain and noted to have a urinary tract infection. Patient was also tearful and depressed. Still having some vague abdominal pain and also intermittent nausea.   REVIEW OF SYSTEMS:    Review of Systems  Constitutional: Negative for chills and fever.  HENT: Negative for congestion and tinnitus.   Eyes: Negative for blurred vision and double vision.  Respiratory: Negative for cough, shortness of breath and wheezing.   Cardiovascular: Negative for chest pain, orthopnea and PND.  Gastrointestinal: Positive for abdominal pain and nausea. Negative for diarrhea and vomiting.  Genitourinary: Negative for dysuria and hematuria.  Neurological: Negative for dizziness, sensory change and focal weakness.  All other systems reviewed and are negative.   Nutrition: Heart healthy/Carb control Tolerating Diet: Yes Tolerating PT: Eval noted.      DRUG ALLERGIES:   Allergies  Allergen Reactions  . Tramadol Nausea Only and Nausea And Vomiting    VITALS:  Blood pressure 125/60, pulse 70, temperature 98.7 F (37.1 C), temperature source Axillary, resp. rate 20, height '5\' 2"'$  (1.575 m), weight 53.9 kg (118 lb 12.8 oz), SpO2 95 %.  PHYSICAL EXAMINATION:   Physical Exam  GENERAL:  83 y.o.-year-old patient lying in bed in no acute distress.  EYES: Pupils equal, round, reactive to light and accommodation. No scleral icterus. Extraocular muscles intact.  HEENT: Head atraumatic, normocephalic. Oropharynx and nasopharynx clear.  NECK:  Supple, no jugular venous distention. No thyroid enlargement, no tenderness.  LUNGS: Normal breath sounds bilaterally, no wheezing, rales, rhonchi. No use of accessory muscles of respiration.  CARDIOVASCULAR: S1, S2 normal. No murmurs, rubs, or gallops.  ABDOMEN: Soft, nontender, nondistended.  Bowel sounds present. No organomegaly or mass.  EXTREMITIES: No cyanosis, clubbing or edema b/l.    NEUROLOGIC: Cranial nerves II through XII are intact. No focal Motor or sensory deficits b/l. Globally weak.   PSYCHIATRIC: The patient is alert and oriented x 3.  Tearful/Depressed.  SKIN: No obvious rash, lesion, or ulcer.    LABORATORY PANEL:   CBC  Recent Labs Lab 01/08/17 0622  WBC 5.8  HGB 11.5*  HCT 33.8*  PLT 356   ------------------------------------------------------------------------------------------------------------------  Chemistries   Recent Labs Lab 01/07/17 1421 01/08/17 0622  NA 138 136  K 3.9 4.0  CL 98* 100*  CO2 31 30  GLUCOSE 232* 214*  BUN 15 13  CREATININE 0.83 0.59  CALCIUM 10.2 9.6  AST 22  --   ALT 15  --   ALKPHOS 102  --   BILITOT 0.5  --    ------------------------------------------------------------------------------------------------------------------  Cardiac Enzymes No results for input(s): TROPONINI in the last 168 hours. ------------------------------------------------------------------------------------------------------------------  RADIOLOGY:  Ct Head Wo Contrast  Result Date: 01/07/2017 CLINICAL DATA:  Progressive weakness. Recently diagnosed lung cancer. Headache EXAM: CT HEAD WITHOUT CONTRAST TECHNIQUE: Contiguous axial images were obtained from the base of the skull through the vertex without intravenous contrast. COMPARISON:  08/11/2016. FINDINGS: Brain: Diffusely enlarged ventricles and subarachnoid spaces. Patchy white matter low density in both cerebral hemispheres. No intracranial hemorrhage, mass lesion or CT evidence of acute infarction. Vascular: No hyperdense vessel or unexpected calcification. Skull: Normal. Negative for fracture or focal lesion. Sinuses/Orbits: Unremarkable. Other: None. IMPRESSION: 1. No acute abnormality. 2. Stable atrophy and chronic small vessel white matter ischemic changes. Electronically  Signed   By:  Claudie Revering M.D.   On: 01/07/2017 13:33   Ct Renal Stone Study  Result Date: 01/07/2017 CLINICAL DATA:  83 y/o F; lower abdominal and flank pain. History of lung cancer. EXAM: CT ABDOMEN AND PELVIS WITHOUT CONTRAST TECHNIQUE: Multidetector CT imaging of the abdomen and pelvis was performed following the standard protocol without IV contrast. COMPARISON:  12/04/2016 PET-CT. FINDINGS: Lower chest: No acute abnormality. Hepatobiliary: Stable cyst adjacent to gallbladder fossa and of lower tip of right lobe of liver. No other focal liver abnormality is seen. No gallstones, gallbladder wall thickening, or biliary dilatation. Pancreas: Unremarkable. No pancreatic ductal dilatation or surrounding inflammatory changes. Spleen: Normal in size without focal abnormality. Adrenals/Urinary Tract: Punctate nonobstructing left-sided caliceal stones are present. There is a left kidney lower pole parapelvic cyst measuring 26 mm. No hydronephrosis. No urinary stone identified. Under distended bladder. Normal adrenal glands. Stomach/Bowel: No obstructive changes of bowel. Increase volume of stool throughout the colon. There is mild fat stranding within the mesorectal fat. Vascular/Lymphatic: Aortic atherosclerosis. No enlarged abdominal or pelvic lymph nodes. Reproductive: Status post hysterectomy. No adnexal masses. Other: Small paraumbilical hernia containing fat. Musculoskeletal: Stable lumbar degenerative changes with grade 1 L4-5 anterolisthesis and fusion of the L4-5 spinous processes. Stable T12 superior endplate deformity with mild anterior loss of height of the vertebral body. No acute osseous abnormality is evident. IMPRESSION: 1. Punctate nonobstructing left kidney stones.  No hydronephrosis. 2. Increased stool throughout the colon probably represents constipation. Mild fat stranding within the mesorectal fat may represent a component of rectal stercoral colitis. 3. Stable additional chronic findings as  above. Electronically Signed   By: Kristine Garbe M.D.   On: 01/07/2017 17:11     ASSESSMENT AND PLAN:   83 year old female with past medical history of hypertension, diabetes, osteoarthritis, recently diagnosed lung cancer who presents to the hospital due to weakness, abdominal pain and noted to have a urinary tract infection.  1. UTI-patient was noted to have abnormal urinalysis consistent with UTI. -Continue IV ceftriaxone, follow urine cultures.  2. Abdominal pain-etiology unclear suspected to be secondary to the UTI. CT renal stone study shows nephrolithiasis but nonobstructive. She did have significant constipation on the CT and could be possibly related to that. -Continue Dulcolax suppository, MiraLAX for constipation and monitor.   3. Essential HTN - cont. Lisinopril, Norvasc  4. DM - cont. SSI and follow BS  5. Neuropathy - cont. Neurontin, Lyrica.    6. Depression - pt. Quite tearful at times and then gets agitated and upset.  ?? Grief related to recent diagnosis of Lung Cancer.  - await Psych consult.    All the records are reviewed and case discussed with Care Management/Social Worker. Management plans discussed with the patient, family and they are in agreement.  CODE STATUS: DNR  DVT Prophylaxis: Lovenox  TOTAL TIME TAKING CARE OF THIS PATIENT: 30 minutes.   POSSIBLE D/C IN 1-2 DAYS, DEPENDING ON CLINICAL CONDITION.   Henreitta Leber M.D on 01/08/2017 at 1:42 PM  Between 7am to 6pm - Pager - [phone]  After 6pm go to [email or web address] - Proofreader  [facility]  Office  [phone]  CC: Primary care physician; Juluis Pitch, MD
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
## 2017-01-08 NOTE — Progress Notes (Addendum)
Inpatient Diabetes Program Recommendations  AACE/ADA: New Consensus Statement on Inpatient Glycemic Control (2015)  Target Ranges:  Prepandial:   less than 140 mg/dL      [facility] postprandial:   less than 180 mg/dL (1-2 hours)      Critically ill patients:  140 - 180 mg/dL  Results for AAMNA, MALLOZZI (MRN [id number]) as of 01/08/2017 09:20  Ref. Range 01/07/2017 20:55 01/08/2017 07:54  Glucose-Capillary Latest Ref Range: 65 - 99 mg/dL 325 (H) 211 (H)  Results for [place]LAND, SARINANA (MRN [id number]) as of 01/08/2017 09:20  Ref. Range 01/07/2017 14:21  Glucose Latest Ref Range: 65 - 99 mg/dL 232 (H)   Results for TASNIA, SPEGAL (MRN [id number]) as of 01/08/2017 09:20  Ref. Range 09/17/2015 10:34  Hemoglobin A1C Latest Ref Range: 4.8 - 5.6 % 8.9 (H)   Review of Glycemic Control  Diabetes history: DM2 Outpatient Diabetes medications: None Current orders for Inpatient glycemic control: Novolog 0-9 units TID with meals, Novolog 0-5 units QHS  Inpatient Diabetes Program Recommendations: Insulin - Basal: While inpatient, please consider ordering Levemir 5 units Q24H. HgbA1C: Please consider ordering an A1C to evaluate glycemic control over the past 2-3 months. Outpatient DM Plan: Initial lab glucose noted to be 232 mg/dl. Patient likely needs to be taking DM medication as an outpatient for DM control. Patient needs to follow up with PCP.  Thanks, Barnie Alderman, RN, MSN, CDE Diabetes Coordinator Inpatient Diabetes Program [phone] (Team Pager from 8am to 5pm)

## 2017-01-08 NOTE — NC FL2 (Signed)
[place] LEVEL OF CARE SCREENING TOOL     IDENTIFICATION  Patient Name: Ruth Rhodes Birthdate: Jul 29, 1934 Sex: female Admission Date (Current Location): 01/07/2017  [place] and [place] Number:  Engineering geologist and Address:  [facility], [address] [place], [place] [address]      Provider Number: [id number]  Attending Physician Name and Address:  Henreitta Leber, MD  Relative Name and Phone Number:       Current Level of Care: Hospital Recommended Level of Care: [place] Prior Approval Number:    Date Approved/Denied:   PASRR Number: [id number]  Discharge Plan: SNF    Current Diagnoses: Patient Active Problem List   Diagnosis Date Noted  . UTI (urinary tract infection) 01/07/2017  . Mass of upper lobe of left lung 08/11/2016  . Lumbar stenosis with neurogenic claudication 09/23/2015    Orientation RESPIRATION BLADDER Height & Weight     Self, Time, Situation, Place  Normal Incontinent Weight: 118 lb 12.8 oz (53.9 kg) Height:  '5\' 2"'$  (157.5 cm)  BEHAVIORAL SYMPTOMS/MOOD NEUROLOGICAL BOWEL NUTRITION STATUS      Continent Diet (Heart Healthy/Carb Modified, Thin Liquids)  AMBULATORY STATUS COMMUNICATION OF NEEDS Skin   Extensive Assist Verbally Normal                       Personal Care Assistance Level of Assistance  Bathing, Feeding, Dressing Bathing Assistance: Limited assistance Feeding assistance: Independent Dressing Assistance: Limited assistance     Functional Limitations Info  Sight, Hearing, Speech Sight Info: Adequate Hearing Info: Adequate Speech Info: Adequate    SPECIAL CARE FACTORS FREQUENCY  PT (By licensed PT)                    Contractures Contractures Info: Not present    Additional Factors Info  Code Status, Allergies, Psychotropic, Insulin Sliding Scale Code Status Info: DNR Allergies Info: Tramadol Psychotropic Info: Medications:  Remeron Insulin  Sliding Scale Info: 4x/day       Current Medications (01/08/2017):  This is the current hospital active medication list Current Facility-Administered Medications  Medication Dose Route Frequency Provider Last Rate Last Dose  . acetaminophen (TYLENOL) tablet 650 mg  650 mg Oral Q6H PRN Demetrios Loll, MD   650 mg at 01/08/17 3382   Or  . acetaminophen (TYLENOL) suppository 650 mg  650 mg Rectal Q6H PRN Demetrios Loll, MD      . albuterol (PROVENTIL) (2.5 MG/3ML) 0.083% nebulizer solution 2.5 mg  2.5 mg Nebulization Q2H PRN Demetrios Loll, MD      . amLODipine (NORVASC) tablet 10 mg  10 mg Oral Daily Demetrios Loll, MD   10 mg at 01/08/17 0837  . bisacodyl (DULCOLAX) EC tablet 5 mg  5 mg Oral Daily PRN Demetrios Loll, MD      . carvedilol (COREG) tablet 25 mg  25 mg Oral BID Demetrios Loll, MD   25 mg at 01/08/17 0836  . cefTRIAXone (ROCEPHIN) 1 g in dextrose 5 % 50 mL IVPB  1 g Intravenous Q24H Demetrios Loll, MD      . docusate sodium (COLACE) capsule 200 mg  200 mg Oral BID Demetrios Loll, MD   200 mg at 01/08/17 0836  . enoxaparin (LOVENOX) injection 40 mg  40 mg Subcutaneous Q24H Demetrios Loll, MD   40 mg at 01/07/17 2109  . gabapentin (NEURONTIN) capsule 400 mg  400 mg Oral TID Demetrios Loll, MD  400 mg at 01/08/17 0836  . insulin aspart (novoLOG) injection 0-5 Units  0-5 Units Subcutaneous QHS Demetrios Loll, MD   4 Units at 01/07/17 2109  . insulin aspart (novoLOG) injection 0-9 Units  0-9 Units Subcutaneous TID WC Demetrios Loll, MD   3 Units at 01/08/17 [id number]  . lisinopril (PRINIVIL,ZESTRIL) tablet 20 mg  20 mg Oral Daily Demetrios Loll, MD   20 mg at 01/08/17 0836  . mirtazapine (REMERON) tablet 15 mg  15 mg Oral QHS Demetrios Loll, MD   15 mg at 01/07/17 2027  . ondansetron (ZOFRAN) tablet 4 mg  4 mg Oral Q6H PRN Demetrios Loll, MD       Or  . ondansetron [facility]) injection 4 mg  4 mg Intravenous Q6H PRN Demetrios Loll, MD      . oxyCODONE-acetaminophen (PERCOCET/ROXICET) 5-325 MG per tablet 1 tablet  1 tablet Oral Q4H PRN Demetrios Loll, MD      . polyethylene  glycol (MIRALAX / GLYCOLAX) packet 17 g  17 g Oral Daily Demetrios Loll, MD   17 g at 01/08/17 0835  . potassium chloride SA (K-DUR,KLOR-CON) CR tablet 20 mEq  20 mEq Oral Daily Demetrios Loll, MD   20 mEq at 01/08/17 0837  . pregabalin (LYRICA) capsule 150 mg  150 mg Oral QHS Demetrios Loll, MD   150 mg at 01/07/17 2027  . senna (SENOKOT) tablet 17.2 mg  2 tablet Oral BID Demetrios Loll, MD   17.2 mg at 01/08/17 0837  . senna-docusate (Senokot-S) tablet 1 tablet  1 tablet Oral QHS PRN Demetrios Loll, MD         Discharge Medications: Please see discharge summary for a list of discharge medications.  Relevant Imaging Results:  Relevant Lab Results:   Additional Information SSN:  [id number]  Darden Dates, LCSW

## 2017-01-08 NOTE — Evaluation (Signed)
Physical Therapy Evaluation Patient Details Name: Ruth Rhodes MRN: [id number] DOB: Feb 19, 1934 Today's Date: 01/08/2017   History of Present Illness  presented to ER secondary to abdominal pain, generalized weakness; admitted with UTI.  Clinical Impression  Upon evaluation,patient alert and oriented to basic information; mild difficulty maintaining topic of conversation at times.  Very fearful of falling; somewhat paranoid and very focused on multiple deficits/inabilities throughout session.  Patient globally weak and deconditioned (R > L hemibody); demonstrates inconsistent active effort and performance with isolated vs functional strength assessment. Some degree of give-way weakness appreciated throughout evaluation.   Currently requiring total assist +1-2 for bed mobility, unsupported sitting balance, sit/stand and bed/chair tranfsers (scoot pivot over level surfaces).  Poor trunk control and sitting balance, demonstrating absent attempts at spontaneous correction.  Is able to volitionally activate bilat LEs to assist with sit/stand, but requires constant cuing from therapist to initiate/sustain throughout standing trial.  Unable to tolerate static stance greater than 10-15 seconds; unsafe/unable to progress towards any standing/stepping at this time. Patient also noted with nystagmus with horizontal gaze (L > R) and mild vertical component; patient unable to participate with more formal testing. Will continue to assess in subsequent sessions as able. Would benefit from skilled PT to address above deficits and promote optimal return to PLOF; recommend transition to STR upon discharge from acute hospitalization.  Do recommend follow up OT, psych and neuro consult; RNCM, CSW and RN aware.    Follow Up Recommendations SNF    Equipment Recommendations       Recommendations for Other Services OT consult (neuro consult, psych consult)     Precautions / Restrictions Precautions Precautions:  Fall Restrictions Weight Bearing Restrictions: No      Mobility  Bed Mobility Overal bed mobility: Needs Assistance Bed Mobility: Supine to Sit     Supine to sit: Max assist;Total assist        Transfers Overall transfer level: Needs assistance Equipment used: Rolling walker (2 wheeled) Transfers: Sit to/from Stand Sit to Stand: Max assist;+2 physical assistance            Ambulation/Gait             General Gait Details: unsafe/unable  Stairs            Wheelchair Mobility    Modified Rankin (Stroke Patients Only)       Balance Overall balance assessment: Needs assistance Sitting-balance support: No upper extremity supported;Feet supported Sitting balance-Leahy Scale: Zero Sitting balance - Comments: multi-directional lean/LOB with increased tendancy towards L posterior/lateral direction; absent attempts at spontaneous correction   Standing balance support: Bilateral upper extremity supported Standing balance-Leahy Scale: Poor Standing balance comment: constant cuing for active extension of bilat hips/knees in standing; total assist for support/balnace                             Pertinent Vitals/Pain Pain Assessment: No/denies pain    Home Living Family/patient expects to be discharged to:: Private residence Living Arrangements: Alone   Type of Home: House Home Access: Stairs to enter Entrance Stairs-Rails: None Entrance Stairs-Number of Steps: 2 Home Layout: One level        Prior Function Level of Independence: Needs assistance         Comments: Patient reports WC level as primary mobility for last 5-6 months, performing squat/stand pivot transfers between seating surfaces (mod indep). Manages bladder/bowels with depends. Has aide 2 hours/day for assist with  ADLs and household chores.       Hand Dominance        Extremity/Trunk Assessment   Upper Extremity Assessment Upper Extremity Assessment: Generalized  weakness (prefers use of L UE for functional tasks; demonstrates 2-/5 with active effort, 3-/5 with act assist.  Giveway weakness noted)    Lower Extremity Assessment Lower Extremity Assessment: Generalized weakness (gross flexion 2-/5, gross extension 4-/5; reports baseline paresthesia knees distally bilat LEs.  Inconsistent active effort/performance noted)       Communication   Communication: No difficulties  Cognition Arousal/Alertness: Awake/alert Behavior During Therapy: WFL for tasks assessed/performed Overall Cognitive Status: Difficult to assess                                 General Comments: very vague in answers at times; difficulty maintaining topic of coversation at times      General Comments      Exercises Other Exercises Other Exercises: Sit/stand with RW x3, max/total assist +2--constant cuing/assist for bilat hip/knee extension, midline orientation/balance.  Inconsistent activation of bilat LEs. Other Exercises: Scoot pivot, bed/chair over level surfaces, max/dep assist +2, with total assist for trunk control, weight shift and lateral movement.  Was able to assist with closed-chain activation of LEs during transfer Other Exercises: Noted with nysgamus with L > R horizontal eye gaze, mild component of vertical nystagmus; will continue to assess in subsequent sessions as appropriate. May benefit from neuro consult to evaluate.   Assessment/Plan    PT Assessment Patient needs continued PT services  PT Problem List Decreased strength;Decreased balance;Decreased activity tolerance;Decreased mobility;Decreased coordination;Decreased cognition;Decreased knowledge of precautions       PT Treatment Interventions DME instruction;Gait training;Stair training;Functional mobility training;Therapeutic activities;Therapeutic exercise;Balance training;Patient/family education    PT Goals (Current goals can be found in the Care Plan section)  Acute Rehab PT  Goals Patient Stated Goal: to get my leg strength back PT Goal Formulation: With patient Time For Goal Achievement: 01/22/17 Potential to Achieve Goals: Fair    Frequency Min 2X/week   Barriers to discharge Decreased caregiver support      Co-evaluation               End of Session Equipment Utilized During Treatment: Gait belt Activity Tolerance: Patient tolerated treatment well Patient left: in chair;with call bell/phone within reach;with chair alarm set Nurse Communication: Mobility status (+2 scoot pivot for return to bed) PT Visit Diagnosis: Muscle weakness (generalized) (M62.81);Difficulty in walking, not elsewhere classified (R26.2)    Time: 3007-6226 PT Time Calculation (min) (ACUTE ONLY): 37 min   Charges:   PT Evaluation $PT Eval Moderate Complexity: 1 Procedure PT Treatments $Therapeutic Activity: 23-37 mins   PT G Codes:   PT G-Codes **NOT FOR INPATIENT CLASS** Functional Assessment Tool Used: AM-PAC 6 Clicks Basic Mobility Functional Limitation: Mobility: Walking and moving around Mobility: Walking and Moving Around Current Status (J3354): At least 80 percent but less than 100 percent impaired, limited or restricted Mobility: Walking and Moving Around Goal Status [id number]): At least 20 percent but less than 40 percent impaired, limited or restricted    Caoimhe Damron H. Owens Shark, PT, DPT, NCS 01/08/17, 1:36 PM [phone]

## 2017-01-08 NOTE — Progress Notes (Signed)
OT Cancellation Note  Patient Details Name: Ruth Rhodes MRN: [id number] DOB: Apr 06, 1934   Cancelled Treatment:    Reason Eval/Treat Not Completed: Fatigue/lethargy limiting ability to participate. Order received, chart reviewed. Extensive time spent attempting to encourage pt participation in evaluation (~15 min). Pt at start of session, grabbed cup of water and took a sip without any assist or difficulty/spillage. However when asked to attempt AROM for evaluation pt did not respond to verbal or tactile cues. Pt reports extremely fatigued, stating, "what do you want? I'm so tired." Pt eyes closed most of the time, declined to open eyes and look at therapist. RN and CNA entered room, gave insulin shot, CNA took vitals, both encouraged pt participation in therapy session. OT provided verbal and tactile cues, all of which did not improve alertness. Will re-attempt OT evaluation at later date/time as appropriate based on pt alertness/arousal level and willingness to participate.   Jeni Salles, MPH, MS, OTR/L ascom [phone] 01/08/17, 2:11 PM

## 2017-01-08 NOTE — Care Management Obs Status (Signed)
[place] NOTIFICATION   Patient Details  Name: Ruth Rhodes MRN: [id number] Date of Birth: 1934-06-02   Medicare Observation Status Notification Given:  Yes    Shelbie Ammons, RN 01/08/2017, 10:37 AM

## 2017-01-09 ENCOUNTER — Encounter
Admission: RE | Admit: 2017-01-09 | Discharge: 2017-01-09 | Disposition: A | Discharge status: Home / Self Care | Payer: Medicare Other | Source: Ambulatory Visit | Attending: Internal Medicine | Admitting: Internal Medicine

## 2017-01-09 LAB — GLUCOSE, CAPILLARY
GLUCOSE-CAPILLARY: 213 mg/dL — AB (ref 65–99)
GLUCOSE-CAPILLARY: 214 mg/dL — AB (ref 65–99)
Glucose-Capillary: 217 mg/dL — ABNORMAL HIGH (ref 65–99)

## 2017-01-09 LAB — URINE CULTURE

## 2017-01-09 MED ORDER — CEFUROXIME AXETIL 250 MG PO TABS: 250.0000 mg | ORAL_TABLET | Freq: Two times a day (BID) | ORAL | 0 refills | Status: AC

## 2017-01-09 MED ORDER — CARVEDILOL 25 MG PO TABS: 25.0000 mg | ORAL_TABLET | Freq: Two times a day (BID) | ORAL | Status: AC

## 2017-01-09 MED ORDER — MIRTAZAPINE 30 MG PO TABS
30.0000 mg | ORAL_TABLET | Freq: Every day | ORAL | Status: DC
Start: 2017-01-09 — End: 2017-04-04

## 2017-01-09 MED ORDER — OXYCODONE-ACETAMINOPHEN 5-325 MG PO TABS: 1.0000 | ORAL_TABLET | ORAL | 0 refills | Status: DC | PRN

## 2017-01-09 NOTE — Progress Notes (Signed)
Report called to Lenna Sciara, RN at [facility] and EMS contacted for report.  Clarise Cruz, RN

## 2017-01-09 NOTE — Progress Notes (Signed)
Patient transported via EMS.

## 2017-01-09 NOTE — Clinical Social Work Note (Addendum)
Clinical Social Work Assessment  Patient Details  Name: Ruth Rhodes MRN: [id number] Date of Birth: April 03, 1934  Date of referral:  01/09/17               Reason for consult:  Facility Placement, Discharge Planning                Permission sought to share information with:  Family Supports Permission granted to share information::  Yes, Verbal Permission Granted  Name::     Warnell Bureau  Relationship::  friend  Contact Information:  [phone]  Housing/Transportation Living arrangements for the past 2 months:  [place] of Information:  Patient Patient Interpreter Needed:  None Criminal Activity/Legal Involvement Pertinent to Current Situation/Hospitalization:  No - Comment as needed Significant Relationships:  Friend Lives with:  Self Do you feel safe going back to the place where you live?  No (Pt is in need of higher level of care) Need for family participation in patient care:  No (Coment)  Care giving concerns:  Pt is in need of a higher level of care.   Social Worker assessment / plan:  CSW met with to address consult for New SNF. CSW introduced herself and explained role of social work. CSW also explained the process of discharge planning to a SNF for STR. CSW presented bed offers and she chose [facility]. CSW updated facility and they are able to accept pt as they have received discharge information. RN will call report. [facility] will provide transportation.   CSW updated pt's friend, Mr. Starr Sinclair, who will assist pt with discharge planning after rehab.   CSW is signing off as no further needs identified.   Employment status:  Retired Nurse, adult PT Recommendations:  [place] / Referral to community resources:  [place]  Patient/Family's Response to care:  Pt and Mr. Starr Sinclair were appreciative of CSW support.   Patient/Family's Understanding of and Emotional Response  to Diagnosis, Current Treatment, and Prognosis:  Mr. Starr Sinclair is aware that pt would benefit from STR and will work on her LTC.  Emotional Assessment Appearance:  Appears stated age Attitude/Demeanor/Rapport:   (Appropriate) Affect (typically observed):  Accepting, Pleasant Orientation:  Oriented to Self, Oriented to Place, Oriented to  Time, Oriented to Situation Alcohol / Substance use:  Not Applicable Psych involvement (Current and /or in the community):  Yes (Comment) (Consult for capacity)  Discharge Needs  Concerns to be addressed:  Adjustment to Illness Readmission within the last 30 days:  No Current discharge risk:  Chronically ill Barriers to Discharge:  No Barriers Identified   Darden Dates, LCSW 01/09/2017, 10:51 AM   ADDENDUM:   Per Psych, pt is able to make her own decisions.  Darden Dates, MSW, LCSW

## 2017-01-09 NOTE — Clinical Social Work Placement (Signed)
   CLINICAL SOCIAL WORK PLACEMENT  NOTE  Date:  01/09/2017  Patient Details  Name: Ruth Rhodes MRN: [id number] Date of Birth: 20-Jan-1934  Clinical Social Work is seeking post-discharge placement for this patient at the [place] level of care (*CSW will initial, date and re-position this form in  chart as items are completed):  Yes   Patient/family provided with [place] Work Department's list of facilities offering this level of care within the geographic area requested by the patient (or if unable, by the patient's family).  Yes   Patient/family informed of their freedom to choose among providers that offer the needed level of care, that participate in Medicare, Medicaid or managed care program needed by the patient, have an available bed and are willing to accept the patient.  Yes   Patient/family informed of [place]'s ownership interest in [facility] and [facility], as well as of the fact that they are under no obligation to receive care at these facilities.  PASRR submitted to EDS on       PASRR number received on       Existing PASRR number confirmed on 01/08/17     FL2 transmitted to all facilities in geographic area requested by pt/family on 01/08/17     FL2 transmitted to all facilities within larger geographic area on       Patient informed that his/her managed care company has contracts with or will negotiate with certain facilities, including the following:        Yes   Patient/family informed of bed offers received.  Patient chooses bed at [facility]     Physician recommends and patient chooses bed at      Patient to be transferred to [facility] on 01/09/17.  Patient to be transferred to facility by [facility]     Patient family notified on 01/09/17 of transfer.  Name of family member notified:  Mr. Warnell Bureau, pt's friend     PHYSICIAN       Additional Comment:     _______________________________________________ Darden Dates, LCSW 01/09/2017, 11:09 AM

## 2017-01-09 NOTE — Discharge Summary (Signed)
[facility] NAME: Ruth Rhodes    MR#:  [id number]  DATE OF BIRTH:  November 21, 1933  DATE OF ADMISSION:  01/07/2017 ADMITTING PHYSICIAN: Demetrios Loll, MD  DATE OF DISCHARGE: 01/09/2017  PRIMARY CARE PHYSICIAN: Juluis Pitch, MD    ADMISSION DIAGNOSIS:  Weakness [R53.1] Lower abdominal pain [R10.30] Acute cystitis with hematuria [N30.01]  DISCHARGE DIAGNOSIS:  Principal Problem:   Adjustment disorder with mixed anxiety and depressed mood Active Problems:   UTI (urinary tract infection)   SECONDARY DIAGNOSIS:   Past Medical History:  Diagnosis Date  . Arthritis   . Back pain   . Cancer ([place])    Lung  . Diabetes mellitus without complication ([place])   . Hypertension     HOSPITAL COURSE:   83 year old female with past medical history of hypertension, diabetes, osteoarthritis, recently diagnosed lung cancer who presents to the hospital due to weakness, abdominal pain and noted to have a urinary tract infection.  1. UTI-patient was noted to have abnormal urinalysis consistent with UTI.  - pt. Was treated w/ IV Ceftriaxone and now empirically being discharged on Oral Ceftin.  Urine cultures showed mixed species.   2. Abdominal pain-etiology unclear suspected to be secondary to the UTI. CT renal stone study shows nephrolithiasis but nonobstructive. She did have significant constipation on the CT and likely related to that.  - pt. Will cont. Colace, Miralax as outpatient.   3. Essential HTN - she will cont. Coreg, Norvasc, Lisinopril.  4. Neuropathy - pt. Will cont. Neurontin  6. Depression - Seen by Psych and they recommended continuing Remeron and her dose was advanced to 30 mg at bedtime.  As per Psych pt. Did have medical decisional making capacity.   7. Lung Cancer - pt. Was recently diagnosed a few months back but has not had a tissue diagnosis.  She had PET Scan in 11/2016 but has not followed with cancer center since  then. Discussed w/ Dr. Mike Gip and she will set up outpatient follow up with patient after discharge today.  Given her Psych history she is not a good candidate for any treatment.   Seen by PT and they recommend SNF and she is being discharged there presently.  She likely needs to be converted to Long term care after that.   DISCHARGE CONDITIONS:   Stable.   CONSULTS OBTAINED:  Treatment Team:  Gonzella Lex, MD  DRUG ALLERGIES:   Allergies  Allergen Reactions  . Tramadol Nausea Only and Nausea And Vomiting    DISCHARGE MEDICATIONS:   Allergies as of 01/09/2017      Reactions   Tramadol Nausea Only, Nausea And Vomiting      Medication List    STOP taking these medications   BYSTOLIC 20 MG Tabs Generic drug:  Nebivolol HCl   levofloxacin 500 MG tablet Commonly known as:  LEVAQUIN   methocarbamol 500 MG tablet Commonly known as:  ROBAXIN   pregabalin 150 MG capsule Commonly known as:  LYRICA   tapentadol 50 MG 12 hr tablet Commonly known as:  NUCYNTA     TAKE these medications   acetaminophen 325 MG tablet Commonly known as:  TYLENOL Take 650 mg by mouth daily as needed.   amLODipine 10 MG tablet Commonly known as:  NORVASC Take 10 mg by mouth daily.   carvedilol 25 MG tablet Commonly known as:  COREG Take 1 tablet (25 mg total) by mouth 2 (two) times daily with a meal.  What changed:  when to take this   cefUROXime 250 MG tablet Commonly known as:  CEFTIN Take 1 tablet (250 mg total) by mouth 2 (two) times daily with a meal.   docusate sodium 100 MG capsule Commonly known as:  COLACE Take 2 capsules (200 mg total) by mouth 2 (two) times daily.   gabapentin 400 MG capsule Commonly known as:  NEURONTIN Take 400 mg by mouth 3 (three) times daily.   lisinopril 20 MG tablet Commonly known as:  PRINIVIL,ZESTRIL Take 20 mg by mouth daily.   mirtazapine 30 MG tablet Commonly known as:  REMERON Take 1 tablet (30 mg total) by mouth at bedtime. What  changed:  medication strength  how much to take  when to take this   oxyCODONE-acetaminophen 5-325 MG tablet Commonly known as:  ROXICET Take 1 tablet by mouth every 4 (four) hours as needed for severe pain.   polyethylene glycol powder powder Commonly known as:  GLYCOLAX/MIRALAX Take 17 g by mouth daily.   potassium chloride SA 20 MEQ tablet Commonly known as:  KLOR-CON M20 Take 1 tablet (20 mEq total) by mouth daily.   senna 8.6 MG Tabs tablet Commonly known as:  SENOKOT Take 2 tablets (17.2 mg total) by mouth 2 (two) times daily.         DISCHARGE INSTRUCTIONS:   DIET:  Cardiac diet  DISCHARGE CONDITION:  Stable  ACTIVITY:  Activity as tolerated  OXYGEN:  Home Oxygen: No.   Oxygen Delivery: room air  DISCHARGE LOCATION:  nursing home   If you experience worsening of your admission symptoms, develop shortness of breath, life threatening emergency, suicidal or homicidal thoughts you must seek medical attention immediately by calling 911 or calling your MD immediately  if symptoms less severe.  You Must read complete instructions/literature along with all the possible adverse reactions/side effects for all the Medicines you take and that have been prescribed to you. Take any new Medicines after you have completely understood and accpet all the possible adverse reactions/side effects.   Please note  You were cared for by a hospitalist during your hospital stay. If you have any questions about your discharge medications or the care you received while you were in the hospital after you are discharged, you can call the unit and asked to speak with the hospitalist on call if the hospitalist that took care of you is not available. Once you are discharged, your primary care physician will handle any further medical issues. Please note that NO REFILLS for any discharge medications will be authorized once you are discharged, as it is imperative that you return to your  primary care physician (or establish a relationship with a primary care physician if you do not have one) for your aftercare needs so that they can reassess your need for medications and monitor your lab values.     Today   Still having some abdominal pain but vague.  No N/V and tolerating PO well.    VITAL SIGNS:  Blood pressure (!) 142/68, pulse 73, temperature 97.5 F (36.4 C), temperature source Oral, resp. rate 17, height '5\' 2"'$  (1.575 m), weight 53.9 kg (118 lb 12.8 oz), SpO2 97 %.  I/O:   Intake/Output Summary (Last 24 hours) at 01/09/17 0939 Last data filed at 01/08/17 1844  Gross per 24 hour  Intake              770 ml  Output  0 ml  Net              770 ml    PHYSICAL EXAMINATION:   GENERAL:  83 y.o.-year-old patient lying in bed in no acute distress.  EYES: Pupils equal, round, reactive to light and accommodation. No scleral icterus. Extraocular muscles intact.  HEENT: Head atraumatic, normocephalic. Oropharynx and nasopharynx clear.  NECK:  Supple, no jugular venous distention. No thyroid enlargement, no tenderness.  LUNGS: Normal breath sounds bilaterally, no wheezing, rales, rhonchi. No use of accessory muscles of respiration.  CARDIOVASCULAR: S1, S2 normal. No murmurs, rubs, or gallops.  ABDOMEN: Soft, nontender, nondistended. Bowel sounds present. No organomegaly or mass.  EXTREMITIES: No cyanosis, clubbing or edema b/l.    NEUROLOGIC: Cranial nerves II through XII are intact. No focal Motor or sensory deficits b/l. Globally weak.   PSYCHIATRIC: The patient is alert and oriented x 3.  Flat affect.  SKIN: No obvious rash, lesion, or ulcer.    DATA REVIEW:   CBC  Recent Labs Lab 01/08/17 0622  WBC 5.8  HGB 11.5*  HCT 33.8*  PLT 356    Chemistries   Recent Labs Lab 01/07/17 1421 01/08/17 0622  NA 138 136  K 3.9 4.0  CL 98* 100*  CO2 31 30  GLUCOSE 232* 214*  BUN 15 13  CREATININE 0.83 0.59  CALCIUM 10.2 9.6  AST 22  --   ALT  15  --   ALKPHOS 102  --   BILITOT 0.5  --     Cardiac Enzymes No results for input(s): TROPONINI in the last 168 hours.  Microbiology Results  Results for orders placed or performed during the hospital encounter of 01/07/17  Urine culture     Status: Abnormal   Collection Time: 01/07/17  3:30 PM  Result Value Ref Range Status   Specimen Description URINE, CLEAN CATCH  Final   Special Requests NONE  Final   Culture MULTIPLE SPECIES PRESENT, SUGGEST RECOLLECTION (A)  Final   Report Status 01/09/2017 FINAL  Final    RADIOLOGY:  Ct Head Wo Contrast  Result Date: 01/07/2017 CLINICAL DATA:  Progressive weakness. Recently diagnosed lung cancer. Headache EXAM: CT HEAD WITHOUT CONTRAST TECHNIQUE: Contiguous axial images were obtained from the base of the skull through the vertex without intravenous contrast. COMPARISON:  08/11/2016. FINDINGS: Brain: Diffusely enlarged ventricles and subarachnoid spaces. Patchy white matter low density in both cerebral hemispheres. No intracranial hemorrhage, mass lesion or CT evidence of acute infarction. Vascular: No hyperdense vessel or unexpected calcification. Skull: Normal. Negative for fracture or focal lesion. Sinuses/Orbits: Unremarkable. Other: None. IMPRESSION: 1. No acute abnormality. 2. Stable atrophy and chronic small vessel white matter ischemic changes. Electronically Signed   By: Claudie Revering M.D.   On: 01/07/2017 13:33   Ct Renal Stone Study  Result Date: 01/07/2017 CLINICAL DATA:  82 y/o F; lower abdominal and flank pain. History of lung cancer. EXAM: CT ABDOMEN AND PELVIS WITHOUT CONTRAST TECHNIQUE: Multidetector CT imaging of the abdomen and pelvis was performed following the standard protocol without IV contrast. COMPARISON:  12/04/2016 PET-CT. FINDINGS: Lower chest: No acute abnormality. Hepatobiliary: Stable cyst adjacent to gallbladder fossa and of lower tip of right lobe of liver. No other focal liver abnormality is seen. No gallstones,  gallbladder wall thickening, or biliary dilatation. Pancreas: Unremarkable. No pancreatic ductal dilatation or surrounding inflammatory changes. Spleen: Normal in size without focal abnormality. Adrenals/Urinary Tract: Punctate nonobstructing left-sided caliceal stones are present. There is a left kidney lower  pole parapelvic cyst measuring 26 mm. No hydronephrosis. No urinary stone identified. Under distended bladder. Normal adrenal glands. Stomach/Bowel: No obstructive changes of bowel. Increase volume of stool throughout the colon. There is mild fat stranding within the mesorectal fat. Vascular/Lymphatic: Aortic atherosclerosis. No enlarged abdominal or pelvic lymph nodes. Reproductive: Status post hysterectomy. No adnexal masses. Other: Small paraumbilical hernia containing fat. Musculoskeletal: Stable lumbar degenerative changes with grade 1 L4-5 anterolisthesis and fusion of the L4-5 spinous processes. Stable T12 superior endplate deformity with mild anterior loss of height of the vertebral body. No acute osseous abnormality is evident. IMPRESSION: 1. Punctate nonobstructing left kidney stones.  No hydronephrosis. 2. Increased stool throughout the colon probably represents constipation. Mild fat stranding within the mesorectal fat may represent a component of rectal stercoral colitis. 3. Stable additional chronic findings as above. Electronically Signed   By: Kristine Garbe M.D.   On: 01/07/2017 17:11      Management plans discussed with the patient, family and they are in agreement.  CODE STATUS:     Code Status Orders        Start     Ordered   01/07/17 1945  Do not attempt resuscitation (DNR)  Continuous    Question Answer Comment  In the event of cardiac or respiratory ARREST Do not call a "code blue"   In the event of cardiac or respiratory ARREST Do not perform Intubation, CPR, defibrillation or ACLS   In the event of cardiac or respiratory ARREST Use medication by any route,  position, wound care, and other measures to relive pain and suffering. May use oxygen, suction and manual treatment of airway obstruction as needed for comfort.      01/07/17 1944      TOTAL TIME TAKING CARE OF THIS PATIENT: 40 minutes.    Henreitta Leber M.D on 01/09/2017 at 9:39 AM  Between 7am to 6pm - Pager - [phone]  After 6pm go to [email or web address] - Proofreader  [facility]  Office  [phone]  CC: Primary care physician; Juluis Pitch, MD

## 2017-01-09 NOTE — Evaluation (Signed)
Occupational Therapy Evaluation Patient Details Name: Ruth Rhodes MRN: [id number] DOB: 08-06-1934 Today's Date: 01/09/2017    History of Present Illness 83yo female pt presented to ER secondary to abdominal pain, generalized weakness; admitted with UTI.    Clinical Impression   Pt seen for OT evaluation this date. Pt presents with deficits in strength, ROM, activity tolerance, impaired UE functional use due to decreased shoulder ROM bilaterally. Pt with no family/friends to provide substantial assistance, was living alone. Pt reports wheelchair bound past 5-6 months, relying on a home aide to assist with bathing daily and household tasks (2hrs/day). Pt reports at baseline able to perform dressing tasks after set up of clothes as well as managing her incontinence with products (wears depends). Pt would benefit from skilled OT services to address noted impairments and functional deficits in order to maximize return to PLOF and minimize falls risk and increased caregiver burden. Pt noted to be discharging to SNF today for STR.    Follow Up Recommendations  SNF    Equipment Recommendations  Other (comment) (defer to next venue of care)    Recommendations for Other Services       Precautions / Restrictions Precautions Precautions: Fall Restrictions Weight Bearing Restrictions: No      Mobility Bed Mobility               General bed mobility comments: did not assess due to pt fatigue, preparing for lunch before transfer to SNF  Transfers                 General transfer comment: did not assess due to pt fatigue, preparing for lunch before transfer to SNF    Balance                                           ADL either performed or assessed with clinical judgement   ADL Overall ADL's : Needs assistance/impaired     Grooming: Set up;Bed level   Upper Body Bathing: Moderate assistance;Bed level   Lower Body Bathing: Maximal assistance;Bed  level   Upper Body Dressing : Set up;Minimal assistance;Bed level   Lower Body Dressing: Maximal assistance;Bed level     Toilet Transfer Details (indicate cue type and reason): did not attempt per pt safety           General ADL Comments: pt generally max assist for LB ADL, mi-mod assist for UB bathing     Vision   Eye Alignment: Within Functional Limits Additional Comments: tracking, convergence, fields testing with no apparent deficits noted     Perception     Praxis      Pertinent Vitals/Pain Pain Assessment: No/denies pain     Hand Dominance Right   Extremity/Trunk Assessment Upper Extremity Assessment Upper Extremity Assessment: Generalized weakness (R shoulder flexion grossly 0-90 degrees, L shoulder flexion grossly 0-145 degrees; grossly 2+/5 bilaterally)   Lower Extremity Assessment Lower Extremity Assessment: Defer to PT evaluation;Generalized weakness       Communication Communication Communication: No difficulties   Cognition Arousal/Alertness: Awake/alert Behavior During Therapy: WFL for tasks assessed/performed Overall Cognitive Status: Difficult to assess                                 General Comments: difficulty maintaining topic of conversation at times; tearful at times but  recovers quickly   General Comments       Exercises     Shoulder Instructions      Home Living Family/patient expects to be discharged to:: Private residence Living Arrangements: Alone   Type of Home: House Home Access: Stairs to enter [employer] of Steps: 2 Entrance Stairs-Rails: None Home Layout: One level     Bathroom Shower/Tub: Teacher, early years/pre: Standard Bathroom Accessibility: No   Home Equipment: Environmental consultant - 2 wheels;Wheelchair - manual          Prior Functioning/Environment Level of Independence: Needs assistance  Gait / Transfers Assistance Needed: Patient reports WC level as primary mobility for  last 5-6 months, performing squat/stand pivot transfers between seating surfaces (mod indep).  ADL's / Homemaking Assistance Needed: Manages bladder/bowels with depends independently. Has aide 2 hours/day for assist with ADLs (bathing) and household chores.              OT Problem List: Decreased strength;Decreased cognition;Decreased range of motion;Impaired UE functional use;Decreased activity tolerance;Impaired balance (sitting and/or standing)      OT Treatment/Interventions:      OT Goals(Current goals can be found in the care plan section) Acute Rehab OT Goals Patient Stated Goal: "I just want to walk again." OT Goal Formulation: With patient Time For Goal Achievement: 01/23/17 Potential to Achieve Goals: Fair  OT Frequency:     Barriers to D/C: Inaccessible home environment;Decreased caregiver support          Co-evaluation              End of Session Nurse Communication: Other (comment) (RN notified, pt requesting medicine for cough)  Activity Tolerance: Patient limited by fatigue Patient left: in bed;with call bell/phone within reach;with bed alarm set  OT Visit Diagnosis: Muscle weakness (generalized) (M62.81);Other abnormalities of gait and mobility (R26.89);Other symptoms and signs involving cognitive function                Time: 1856-3149 OT Time Calculation (min): 20 min Charges:  OT General Charges $OT Visit: 1 Procedure OT Evaluation $OT Eval Moderate Complexity: 1 Procedure G-Codes: OT G-codes **NOT FOR INPATIENT CLASS** Functional Assessment Tool Used: Clinical judgement;AM-PAC 6 Clicks Daily Activity Functional Limitation: Self care Self Care Current Status (F0263): At least 40 percent but less than 60 percent impaired, limited or restricted Self Care Goal Status (Z8588): At least 40 percent but less than 60 percent impaired, limited or restricted   Jeni Salles, MPH, MS, OTR/L ascom [phone] 01/09/17, 1:27 PM

## 2017-01-11 DIAGNOSIS — F321 Major depressive disorder, single episode, moderate: Secondary | ICD-10-CM | POA: Insufficient documentation

## 2017-01-11 DIAGNOSIS — C3492 Malignant neoplasm of unspecified part of left bronchus or lung: Secondary | ICD-10-CM | POA: Insufficient documentation

## 2017-01-16 ENCOUNTER — Encounter
Admission: RE | Admit: 2017-01-16 | Discharge: 2017-01-16 | Disposition: A | Discharge status: Home / Self Care | Payer: Medicare Other | Source: Ambulatory Visit | Attending: Internal Medicine | Admitting: Internal Medicine

## 2017-01-19 ENCOUNTER — Inpatient Hospital Stay: Payer: Medicare Other | Attending: Hematology and Oncology | Admitting: Hematology and Oncology

## 2017-01-19 NOTE — Progress Notes (Deleted)
[facility] day:  01/19/2017  Chief Complaint: Ruth Rhodes is a 83 y.o. female with a lung mass who is seen for review of interval PET scan.  HPI:  The patient was last seen in the medical oncology clinic on 11/24/2016.  At that time, she was seen for reassessment after being lost to follow-up.  She was ready to pursue evaluation of her probable lung cancer.  She had just been admitted to [facility].  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  There was bilateral mediastinal hypermetabolic adenopathy (index 1.2 cm right paratracheal node, SUV 22.9).  There was a 10 mm low left cervical node (SUV 14.4) and 1.4 cm low right jugular node (SUV 11.6).  There was no subdiaphragmatic hypermetabolic disease.  There was soft tissue fullness with calcification and hypermetabolism anterior to the left hip is suspicious for the sequelae of prior trauma and/or iliopsoas bursitis.  There was coronary artery atherosclerosis and aortic atherosclerosis.  She was admitted to [facility] from 01/07/2017 - 01/09/2017 with weakness, lower abdominal pain, and acute cystitis with hematuria.  Urine culture revealed mixed species.  She was treated with ceftriaxone and discharged on oral Ceftin.  CT stone study on 01/07/2017 revealed punctate non-obstructing left kidney stones.  There was increased stool throughout the colon.  Head CT without contrast on 01/07/2017 revealed no acute abnormality.  While hospitalized, she saw Dr. Alethia Berthold, psychiatrist.  She was felt to have an adjustment disorder although she could meet criteria for depression.  She was started on mirtazapine.   Past Medical History:  Diagnosis Date  . Arthritis   . Back pain   . Cancer ([place])    Lung  . Diabetes mellitus without complication ([place])   . Hypertension     Past Surgical History:  Procedure Laterality Date  . ABDOMINAL HYSTERECTOMY    .  LUMBAR LAMINECTOMY WITH COFLEX 2 LEVEL Bilateral 09/23/2015   Procedure: Laminectomy and Foraminotomy L4-L5 bilateral with CoFlex L5-S1 - left;  Surgeon: Karie Chimera, MD;  Location: [facility];  Service: Neurosurgery;  Laterality: Bilateral;  . WOUND EXPLORATION N/A 09/27/2015   Procedure: WOUND EXPLORATION;  Surgeon: Kevan Ny Ditty, MD;  Location: [place] [facility];  Service: Neurosurgery;  Laterality: N/A;  WOUND EXPLORATION    Family History  Problem Relation Age of Onset  . Cancer Mother   . Heart failure Father   . Heart attack Sister   . Hypertension Sister     Social History:  reports that she quit smoking about 30 years ago. She quit after 50.00 years of use. She has never used smokeless tobacco. She reports that she does not drink alcohol or use drugs.  She smoked a carton of cigarettes a week from age [age].  She previously drank alcohol socially.  She is retired.  She previously worked in [employer].  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  She has 1 son, 4 grandchildren and 3 great grandchildren.  She owns her own home.  She is at home by herself at night.  She is scheduled to begin a 90 day stay at [facility] today.  The patient is accompanied by a woman today.  Allergies:  Allergies  Allergen Reactions  . Tramadol Nausea Only and Nausea And Vomiting    Current Medications: Current Outpatient Prescriptions  Medication Sig Dispense Refill  . acetaminophen (TYLENOL) 325 MG tablet Take 650 mg by mouth  daily as needed.    Marland Kitchen amLODipine (NORVASC) 10 MG tablet Take 10 mg by mouth daily.    . carvedilol (COREG) 25 MG tablet Take 1 tablet (25 mg total) by mouth 2 (two) times daily with a meal.    . docusate sodium (COLACE) 100 MG capsule Take 2 capsules (200 mg total) by mouth 2 (two) times daily. 120 capsule 0  . gabapentin (NEURONTIN) 400 MG capsule Take 400 mg by mouth 3 (three) times daily.    Marland Kitchen lisinopril (PRINIVIL,ZESTRIL) 20 MG tablet Take 20 mg by mouth daily.     . mirtazapine  (REMERON) 30 MG tablet Take 1 tablet (30 mg total) by mouth at bedtime.    Marland Kitchen oxyCODONE-acetaminophen (ROXICET) 5-325 MG tablet Take 1 tablet by mouth every 4 (four) hours as needed for severe pain. 30 tablet 0  . polyethylene glycol powder (GLYCOLAX/MIRALAX) powder Take 17 g by mouth daily. 255 g 0  . potassium chloride SA (KLOR-CON M20) 20 MEQ tablet Take 1 tablet (20 mEq total) by mouth daily. 7 tablet 0  . senna (SENOKOT) 8.6 MG TABS tablet Take 2 tablets (17.2 mg total) by mouth 2 (two) times daily. 120 each 0   No current facility-administered medications for this visit.     Review of Systems:  GENERAL:  Fatigue.  No fevers or sweats.  Weight loss of 3 pounds. PERFORMANCE STATUS (ECOG):  3 HEENT:  No diplopia.  No runny nose, sore throat, mouth sores or tenderness. Lungs: Shortness of breath with exertion.  Chronic cough.  No hemoptysis. Cardiac:  No chest pain, palpitations, orthopnea, or PND. GI:  Some nausea.  No vomiting, diarrhea, constipation, melena or hematochezia.  Colonoscopy "a long time ago".  s/p appendectomy. GU:  No urgency, frequency, dysuria, or hematuria. Musculoskeletal:  Arthritis in arms and legs.  Can "only raise arms so far".  No muscle tenderness. Extremities:  No pain or swelling. Skin:  No rashes or skin changes. Neuro:  General weakness.  Has not walked in 3 weeks.  Balance poor (chronic).  No headache, numbness or weakness, or coordination issues. Endocrine:  No diabetes, thyroid issues, hot flashes or night sweats. Psych:  No mood changes, depression or anxiety. Pain:  No focal pain. Review of systems:  All other systems reviewed and found to be negative.  Physical Exam: There were no vitals taken for this visit. GENERAL:  Elderly woman sitting comfortably in a wheelchair in the exam room in no acute distress. MENTAL STATUS:  Alert and oriented to person, place and time. HEAD:  Wearing a cap.  Curly gray hair.  Normocephalic, atraumatic, face symmetric,  no Cushingoid features. EYES:  Glasses.  Hazel eyes.  Pupils equal round and reactive to light and accomodation.  No conjunctivitis or scleral icterus. ENT:  Oropharynx clear without lesion.  Tongue normal.  Lower dentures.  Mucous membranes moist.  RESPIRATORY:  Clear to auscultation without rales, wheezes or rhonchi. CARDIOVASCULAR:  Regular rate and rhythm without murmur, rub or gallop. ABDOMEN:  Soft, non-tender with active bowel sounds and no hepatosplenomegaly.  No masses. SKIN:  No rashes, ulcers or lesions. EXTREMITIES:  Decreased upper extremity range of motion.  No edema, no skin discoloration or tenderness.  No palpable cords. LYMPH NODES: No palpable cervical, supraclavicular, axillary or inguinal adenopathy  NEUROLOGICAL: Alert & oriented, cranial nerves II-XII intact; lower extremity motor strength symmetric; sensation intact.  PSYCH:  Appropriate.   No visits with results within 3 Day(s) from this visit.  Latest  known visit with results is:  Admission on 01/07/2017, Discharged on 01/09/2017  Component Date Value Ref Range Status  . WBC 01/07/2017 6.5  3.6 - 11.0 K/uL Final  . RBC 01/07/2017 4.64  3.80 - 5.20 MIL/uL Final  . Hemoglobin 01/07/2017 13.0  12.0 - 16.0 g/dL Final  . HCT 01/07/2017 38.1  35.0 - 47.0 % Final  . MCV 01/07/2017 82.0  80.0 - 100.0 fL Final  . MCH 01/07/2017 28.0  26.0 - 34.0 pg Final  . MCHC 01/07/2017 34.2  32.0 - 36.0 g/dL Final  . RDW 01/07/2017 15.6* 11.5 - 14.5 % Final  . Platelets 01/07/2017 393  150 - 440 K/uL Final  . Neutrophils Relative % 01/07/2017 57  % Final  . Neutro Abs 01/07/2017 3.7  1.4 - 6.5 K/uL Final  . Lymphocytes Relative 01/07/2017 27  % Final  . Lymphs Abs 01/07/2017 1.8  1.0 - 3.6 K/uL Final  . Monocytes Relative 01/07/2017 11  % Final  . Monocytes Absolute 01/07/2017 0.7  0.2 - 0.9 K/uL Final  . Eosinophils Relative 01/07/2017 4  % Final  . Eosinophils Absolute 01/07/2017 0.3  0 - 0.7 K/uL Final  . Basophils Relative  01/07/2017 1  % Final  . Basophils Absolute 01/07/2017 0.0  0 - 0.1 K/uL Final  . Sodium 01/07/2017 138  135 - 145 mmol/L Final  . Potassium 01/07/2017 3.9  3.5 - 5.1 mmol/L Final  . Chloride 01/07/2017 98* 101 - 111 mmol/L Final  . CO2 01/07/2017 31  22 - 32 mmol/L Final  . Glucose, Bld 01/07/2017 232* 65 - 99 mg/dL Final  . BUN 01/07/2017 15  6 - 20 mg/dL Final  . Creatinine, Ser 01/07/2017 0.83  0.44 - 1.00 mg/dL Final  . Calcium 01/07/2017 10.2  8.9 - 10.3 mg/dL Final  . Total Protein 01/07/2017 8.3* 6.5 - 8.1 g/dL Final  . Albumin 01/07/2017 3.9  3.5 - 5.0 g/dL Final  . AST 01/07/2017 22  15 - 41 U/L Final  . ALT 01/07/2017 15  14 - 54 U/L Final  . Alkaline Phosphatase 01/07/2017 102  38 - 126 U/L Final  . Total Bilirubin 01/07/2017 0.5  0.3 - 1.2 mg/dL Final  . GFR calc non Af Amer 01/07/2017 >60  >60 mL/min Final  . GFR calc Af Amer 01/07/2017 >60  >60 mL/min Final   Comment: (NOTE) The eGFR has been calculated using the CKD EPI equation. This calculation has not been validated in all clinical situations. eGFR's persistently <60 mL/min signify possible Chronic Kidney Disease.   . Anion gap 01/07/2017 9  5 - 15 Final  . Lipase 01/07/2017 20  11 - 51 U/L Final  . Color, Urine 01/07/2017 YELLOW* YELLOW Final  . APPearance 01/07/2017 CLOUDY* CLEAR Final  . Specific Gravity, Urine 01/07/2017 1.016  1.005 - 1.030 Final  . pH 01/07/2017 5.0  5.0 - 8.0 Final  . Glucose, UA 01/07/2017 NEGATIVE  NEGATIVE mg/dL Final  . Hgb urine dipstick 01/07/2017 NEGATIVE  NEGATIVE Final  . Bilirubin Urine 01/07/2017 NEGATIVE  NEGATIVE Final  . Ketones, ur 01/07/2017 NEGATIVE  NEGATIVE mg/dL Final  . Protein, ur 01/07/2017 NEGATIVE  NEGATIVE mg/dL Final  . Nitrite 01/07/2017 NEGATIVE  NEGATIVE Final  . Leukocytes, UA 01/07/2017 MODERATE* NEGATIVE Final  . RBC / HPF 01/07/2017 TOO NUMEROUS TO COUNT  0 - 5 RBC/hpf Final  . WBC, UA 01/07/2017 TOO NUMEROUS TO COUNT  0 - 5 WBC/hpf Final  . Bacteria,  UA 01/07/2017  RARE* NONE SEEN Final  . Squamous Epithelial / LPF 01/07/2017 6-30* NONE SEEN Final  . Mucous 01/07/2017 PRESENT   Final  . Budding Yeast 01/07/2017 PRESENT   Final  . Sodium 01/08/2017 136  135 - 145 mmol/L Final  . Potassium 01/08/2017 4.0  3.5 - 5.1 mmol/L Final  . Chloride 01/08/2017 100* 101 - 111 mmol/L Final  . CO2 01/08/2017 30  22 - 32 mmol/L Final  . Glucose, Bld 01/08/2017 214* 65 - 99 mg/dL Final  . BUN 01/08/2017 13  6 - 20 mg/dL Final  . Creatinine, Ser 01/08/2017 0.59  0.44 - 1.00 mg/dL Final  . Calcium 01/08/2017 9.6  8.9 - 10.3 mg/dL Final  . GFR calc non Af Amer 01/08/2017 >60  >60 mL/min Final  . GFR calc Af Amer 01/08/2017 >60  >60 mL/min Final   Comment: (NOTE) The eGFR has been calculated using the CKD EPI equation. This calculation has not been validated in all clinical situations. eGFR's persistently <60 mL/min signify possible Chronic Kidney Disease.   . Anion gap 01/08/2017 6  5 - 15 Final  . WBC 01/08/2017 5.8  3.6 - 11.0 K/uL Final  . RBC 01/08/2017 4.09  3.80 - 5.20 MIL/uL Final  . Hemoglobin 01/08/2017 11.5* 12.0 - 16.0 g/dL Final  . HCT 01/08/2017 33.8* 35.0 - 47.0 % Final  . MCV 01/08/2017 82.6  80.0 - 100.0 fL Final  . MCH 01/08/2017 28.1  26.0 - 34.0 pg Final  . MCHC 01/08/2017 34.0  32.0 - 36.0 g/dL Final  . RDW 01/08/2017 15.6* 11.5 - 14.5 % Final  . Platelets 01/08/2017 356  150 - 440 K/uL Final  . Specimen Description 01/07/2017 URINE, CLEAN CATCH   Final  . Special Requests 01/07/2017 NONE   Final  . Culture 01/07/2017 MULTIPLE SPECIES PRESENT, SUGGEST RECOLLECTION*  Final  . Report Status 01/07/2017 01/09/2017 FINAL   Final  . Glucose-Capillary 01/07/2017 325* 65 - 99 mg/dL Final  . Glucose-Capillary 01/08/2017 211* 65 - 99 mg/dL Final  . Glucose-Capillary 01/08/2017 253* 65 - 99 mg/dL Final  . Glucose-Capillary 01/08/2017 159* 65 - 99 mg/dL Final  . Glucose-Capillary 01/08/2017 215* 65 - 99 mg/dL Final  . Glucose-Capillary  01/08/2017 213* 65 - 99 mg/dL Final  . Glucose-Capillary 01/09/2017 214* 65 - 99 mg/dL Final  . Glucose-Capillary 01/09/2017 217* 65 - 99 mg/dL Final    Assessment:  Ruth Rhodes is a 83 y.o. female with a left upper lobe lung mass after presenting with a fall.  She has a 40-50 pack year smoking history.  Cervical and thoracic spine CT on 08/11/2016 revealed a 3.4 x 1.7 cm spiculated left upper lung mass which had progressed from ground glass-glass opacity with evidence of new mediastinal adenopathy.   Head CT without contrast on 08/11/2016 revealed no evidence of metastatic disease.  Abdomen and pelvic CT on 10/28/2016 revealed no acute process.  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  She has been lost to follow-up.  She represents to medical attention.  She has been bed ridden x 3 weeks secondary to generalized weakness.  Performance status is 3.  She is scheduled to be admitted to [facility] today.  Plan: 1.  Discuss patient's thoughts about therapy.  She is interested in proceeding with a work-up.  Discuss plan to reschedule PET scan.  Discuss plan for biopsy after PET scan.  Preliminary discussion regarding treatment.   2.  Schedule PET scan. 3.  Patient to begin stay at [facility] for rehabilitation today. 4.  RTC after PET scan to discuss plan for biopsy.   Lequita Asal, MD  01/19/2017, 6:10 AM

## 2017-01-31 ENCOUNTER — Non-Acute Institutional Stay (SKILLED_NURSING_FACILITY): Payer: Medicare Other | Admitting: Gerontology

## 2017-01-31 DIAGNOSIS — R05 Cough: Secondary | ICD-10-CM | POA: Diagnosis not present

## 2017-01-31 DIAGNOSIS — K5900 Constipation, unspecified: Secondary | ICD-10-CM | POA: Diagnosis not present

## 2017-01-31 DIAGNOSIS — R059 Cough, unspecified: Secondary | ICD-10-CM

## 2017-02-06 DIAGNOSIS — K5900 Constipation, unspecified: Secondary | ICD-10-CM | POA: Insufficient documentation

## 2017-02-06 NOTE — Progress Notes (Signed)
Location:      Place of Service:  SNF (31) Provider:  Toni Arthurs, NP-C  Juluis Pitch, MD  Patient Care Team: Juluis Pitch, MD as PCP - General [facility] Medicine)  Extended Emergency Contact Information Primary Emergency Contact: [facility] Address: [address]          [place]          [place], [place] [address] Johnnette Litter of [place] Phone: [phone] Relation: Friend Secondary Emergency Contact: Francena Hanly [place] of [place] Phone: [phone] Relation: Friend  Code Status:  DNR Goals of care: Advanced Directive information Advanced Directives 01/07/2017  Does Patient Have a Medical Advance Directive? Yes  Type of Paramedic of [place];Living will  Does patient want to make changes to medical advance directive? Yes (Inpatient - patient requests chaplain consult to change a medical advance directive)  Copy of [place] in Chart? No - copy requested  Would patient like information on creating a medical advance directive? -     Chief Complaint  Patient presents with  . Acute Visit    HPI:  Pt is a 83 y.o. female seen today for an acute visit for cough and constipation. Pt has been having a dry, irritating cough for a few days. Non-productive. Pt denies allergies, denies PND. Afebrile. Pt also c/o constipation. Pt has not had a BM in 2 days, but she is starting to feel uncomfortable. Pt c/o intermittent nausea. Pt denies diarrhea/f/c/cp/sob/ha/abd pain/dizziness. VSS. No other complaints.    Past Medical History:  Diagnosis Date  . Arthritis   . Back pain   . Cancer ([place])    Lung  . Diabetes mellitus without complication ([place])   . Hypertension    Past Surgical History:  Procedure Laterality Date  . ABDOMINAL HYSTERECTOMY    . LUMBAR LAMINECTOMY WITH COFLEX 2 LEVEL Bilateral 09/23/2015   Procedure: Laminectomy and Foraminotomy L4-L5 bilateral with CoFlex L5-S1 - left;  Surgeon: Karie Chimera, MD;  Location: [facility];  Service: Neurosurgery;  Laterality: Bilateral;  . WOUND EXPLORATION N/A 09/27/2015   Procedure: WOUND EXPLORATION;  Surgeon: Kevan Ny Ditty, MD;  Location: [place];  Service: Neurosurgery;  Laterality: N/A;  WOUND EXPLORATION    Allergies  Allergen Reactions  . Tramadol Nausea Only and Nausea And Vomiting    Allergies as of 01/31/2017      Reactions   Tramadol Nausea Only, Nausea And Vomiting      Medication List       Accurate as of 01/31/17 11:59 PM. Always use your most recent med list.          acetaminophen 325 MG tablet Commonly known as:  TYLENOL Take 650 mg by mouth daily as needed.   amLODipine 10 MG tablet Commonly known as:  NORVASC Take 10 mg by mouth daily.   carvedilol 25 MG tablet Commonly known as:  COREG Take 1 tablet (25 mg total) by mouth 2 (two) times daily with a meal.   docusate sodium 100 MG capsule Commonly known as:  COLACE Take 2 capsules (200 mg total) by mouth 2 (two) times daily.   gabapentin 400 MG capsule Commonly known as:  NEURONTIN Take 400 mg by mouth 3 (three) times daily.   lisinopril 20 MG tablet Commonly known as:  PRINIVIL,ZESTRIL Take 20 mg by mouth daily.   mirtazapine 30 MG tablet Commonly known as:  REMERON Take 1 tablet (30 mg total) by mouth at bedtime.  oxyCODONE-acetaminophen 5-325 MG tablet Commonly known as:  ROXICET Take 1 tablet by mouth every 4 (four) hours as needed for severe pain.   polyethylene glycol powder powder Commonly known as:  GLYCOLAX/MIRALAX Take 17 g by mouth daily.   potassium chloride SA 20 MEQ tablet Commonly known as:  KLOR-CON M20 Take 1 tablet (20 mEq total) by mouth daily.   senna 8.6 MG Tabs tablet Commonly known as:  SENOKOT Take 2 tablets (17.2 mg total) by mouth 2 (two) times daily.       Review of Systems  Constitutional: Negative for activity change, appetite change, chills, diaphoresis and fever.  HENT: Negative for  congestion, sneezing, sore throat, trouble swallowing and voice change.   Eyes: Negative for pain, redness and visual disturbance.  Respiratory: Positive for cough. Negative for apnea, choking, chest tightness, shortness of breath and wheezing.   Cardiovascular: Negative for chest pain, palpitations and leg swelling.  Gastrointestinal: Positive for constipation. Negative for abdominal distention, abdominal pain, diarrhea and nausea.  Genitourinary: Negative for difficulty urinating, dysuria, frequency and urgency.  Musculoskeletal: Negative for back pain, gait problem and myalgias. Arthralgias: typical arthritis.  Skin: Negative for color change, pallor, rash and wound.  Neurological: Negative for dizziness, tremors, syncope, speech difficulty, weakness, numbness and headaches.  Psychiatric/Behavioral: Negative for agitation and behavioral problems.  All other systems reviewed and are negative.    There is no immunization history on file for this patient. Pertinent  Health Maintenance Due  Topic Date Due  . DEXA SCAN  04/29/2001  . PNA vac Low Risk Adult (1 of 2 - PCV13) 04/29/2001  . INFLUENZA VACCINE  07/18/2017 (Originally 04/18/2017)   No flowsheet data found. Functional Status Survey:    Vitals:   01/29/17 2330  BP: (!) 110/50  Pulse: 74  Resp: (!) 28  Temp: 98.5 F (36.9 C)  SpO2: 96%   There is no height or weight on file to calculate BMI. Physical Exam  Constitutional: She is oriented to person, place, and time. Vital signs are normal. She appears well-developed and well-nourished. She is active and cooperative. She does not appear ill. No distress.  HENT:  Head: Normocephalic and atraumatic.  Mouth/Throat: Uvula is midline, oropharynx is clear and moist and mucous membranes are normal. Mucous membranes are not pale, not dry and not cyanotic.  Eyes: Conjunctivae, EOM and lids are normal. Pupils are equal, round, and reactive to light.  Neck: Trachea normal, normal range  of motion and full passive range of motion without pain. Neck supple. No JVD present. No tracheal deviation, no edema and no erythema present. No thyromegaly present.  Cardiovascular: Normal rate, regular rhythm, intact distal pulses and normal pulses.  Exam reveals no gallop, no distant heart sounds and no friction rub.   No murmur heard. Pulmonary/Chest: Effort normal and breath sounds normal. No accessory muscle usage. No respiratory distress. She has no wheezes. She has no rales. She exhibits no tenderness.  Abdominal: Normal appearance and bowel sounds are normal. She exhibits no distension and no ascites. There is no tenderness.  Musculoskeletal: Normal range of motion. She exhibits no edema or tenderness.  Expected osteoarthritis, stiffness  Neurological: She is alert and oriented to person, place, and time. She has normal strength.  Skin: Skin is warm, dry and intact. No rash noted. She is not diaphoretic. No cyanosis or erythema. No pallor. Nails show no clubbing.  Psychiatric: She has a normal mood and affect. Her speech is normal and behavior is normal. Judgment  and thought content normal. Cognition and memory are normal.  Nursing note and vitals reviewed.   Labs reviewed:  Recent Labs  10/30/16 1923 01/07/17 1421 01/08/17 0622  NA 136 138 136  K 4.5 3.9 4.0  CL 100* 98* 100*  CO2 '29 31 30  '$ GLUCOSE 192* 232* 214*  BUN '14 15 13  '$ CREATININE 0.79 0.83 0.59  CALCIUM 9.5 10.2 9.6    Recent Labs  10/27/16 2328 10/30/16 1923 01/07/17 1421  AST '24 31 22  '$ ALT '14 15 15  '$ ALKPHOS 87 74 102  BILITOT 0.5 0.8 0.5  PROT 7.8 7.0 8.3*  ALBUMIN 3.8 3.4* 3.9    Recent Labs  02/07/16 2235  10/30/16 1923 01/07/17 1421 01/08/17 0622  WBC 8.8  < > 6.8 6.5 5.8  NEUTROABS 5.4  --  3.7 3.7  --   HGB 13.7  < > 12.1 13.0 11.5*  HCT 40.0  < > 34.9* 38.1 33.8*  MCV 84.6  < > 83.7 82.0 82.6  PLT 277  < > 318 393 356  < > = values in this interval not displayed. No results found  for: TSH Lab Results  Component Value Date   HGBA1C 8.9 (H) 09/17/2015   No results found for: CHOL, HDL, LDLCALC, LDLDIRECT, TRIG, CHOLHDL  Significant Diagnostic Results in last 30 days:  No results found.  Assessment/Plan 1. Cough  Cough Drops- may have 1 drop Q 2 hours prn  Pt does not wish for anything other than the coughdrops  2. Constipation, unspecified constipation type  DC Colace  DC Senna  Senna S- 1 tablet po BID  Milk of Magnesia- 30 mL po Q Day prn, give 1 dose now  Family/ staff Communication:   Total Time:  Documentation:  Face to Face:  Family/Phone:   Labs/tests ordered:    Medication list reviewed and assessed for continued appropriateness.  Vikki Ports, NP-C Geriatrics [facility] [phone], [place] [address] Cell Phone (Mon-Fri 8am-5pm):  [phone] On Call:  [phone] & follow prompts after 5pm & weekends Office Phone:  [phone] Office Fax:  [phone]

## 2017-02-16 ENCOUNTER — Encounter
Admission: RE | Admit: 2017-02-16 | Discharge: 2017-02-16 | Disposition: A | Discharge status: Home / Self Care | Payer: Medicare Other | Source: Ambulatory Visit | Attending: Internal Medicine | Admitting: Internal Medicine

## 2017-02-21 ENCOUNTER — Non-Acute Institutional Stay (SKILLED_NURSING_FACILITY): Payer: Medicare Other | Admitting: Gerontology

## 2017-02-21 DIAGNOSIS — Z9181 History of falling: Secondary | ICD-10-CM | POA: Diagnosis not present

## 2017-02-21 DIAGNOSIS — E876 Hypokalemia: Secondary | ICD-10-CM

## 2017-02-21 DIAGNOSIS — Z85118 Personal history of other malignant neoplasm of bronchus and lung: Secondary | ICD-10-CM | POA: Diagnosis not present

## 2017-02-21 DIAGNOSIS — M1990 Unspecified osteoarthritis, unspecified site: Secondary | ICD-10-CM | POA: Diagnosis not present

## 2017-02-21 DIAGNOSIS — M6281 Muscle weakness (generalized): Secondary | ICD-10-CM

## 2017-02-21 DIAGNOSIS — E1140 Type 2 diabetes mellitus with diabetic neuropathy, unspecified: Secondary | ICD-10-CM | POA: Diagnosis not present

## 2017-02-21 DIAGNOSIS — K5900 Constipation, unspecified: Secondary | ICD-10-CM | POA: Diagnosis not present

## 2017-02-21 DIAGNOSIS — F4323 Adjustment disorder with mixed anxiety and depressed mood: Secondary | ICD-10-CM | POA: Diagnosis not present

## 2017-02-21 DIAGNOSIS — I10 Essential (primary) hypertension: Secondary | ICD-10-CM | POA: Diagnosis not present

## 2017-02-21 DIAGNOSIS — M48062 Spinal stenosis, lumbar region with neurogenic claudication: Secondary | ICD-10-CM | POA: Diagnosis not present

## 2017-03-11 DIAGNOSIS — Z9181 History of falling: Secondary | ICD-10-CM | POA: Insufficient documentation

## 2017-03-11 DIAGNOSIS — E876 Hypokalemia: Secondary | ICD-10-CM | POA: Insufficient documentation

## 2017-03-11 DIAGNOSIS — M1990 Unspecified osteoarthritis, unspecified site: Secondary | ICD-10-CM | POA: Insufficient documentation

## 2017-03-11 DIAGNOSIS — I10 Essential (primary) hypertension: Secondary | ICD-10-CM | POA: Insufficient documentation

## 2017-03-11 DIAGNOSIS — M6281 Muscle weakness (generalized): Secondary | ICD-10-CM | POA: Insufficient documentation

## 2017-03-11 DIAGNOSIS — Z85118 Personal history of other malignant neoplasm of bronchus and lung: Secondary | ICD-10-CM | POA: Insufficient documentation

## 2017-03-11 DIAGNOSIS — E1140 Type 2 diabetes mellitus with diabetic neuropathy, unspecified: Secondary | ICD-10-CM | POA: Insufficient documentation

## 2017-03-11 NOTE — Progress Notes (Signed)
Location:      Place of Service:  SNF (31) Provider:  Toni Arthurs, NP-C  Juluis Pitch, MD  Patient Care Team: Juluis Pitch, MD as PCP - General [facility])  Extended Emergency Contact Information Primary Emergency Contact: [facility] Address: [address]          [place]          [place], [place] [address] Johnnette Litter of [place] Phone: [phone] Relation: Friend Secondary Emergency Contact: Francena Hanly [place] of [place] Phone: [phone] Relation: Friend  Code Status:  DNR Goals of care: Advanced Directive information Advanced Directives 01/07/2017  Does Patient Have a Medical Advance Directive? Yes  Type of Paramedic of [place];Living will  Does patient want to make changes to medical advance directive? Yes (Inpatient - patient requests chaplain consult to change a medical advance directive)  Copy of [place] in Chart? No - copy requested  Would patient like information on creating a medical advance directive? -     Chief Complaint  Patient presents with  . Medical Management of Chronic Issues    HPI:  Pt is a 83 y.o. female seen today for medical management of chronic diseases. Pt has been stable over the past month. Her appetite is diminished. On Mirtazepine. Attempted to order Marinol last month for appetite stimulant, but insurance denied. Continue to encourage po food and fluid intake. Continue Glucerna BID. Pt reports she does not like the food. No falls this month. Pt does not socialize with others. She typically stays in her room and keeps to herself. Pt being followed by Palliative Care as well as [facility]. VSS. No other complaints.    Past Medical History:  Diagnosis Date  . Arthritis   . Back pain   . Cancer ([place])    Lung  . Diabetes mellitus without complication ([place])   . Hypertension    Past Surgical History:  Procedure Laterality Date  .  ABDOMINAL HYSTERECTOMY    . LUMBAR LAMINECTOMY WITH COFLEX 2 LEVEL Bilateral 09/23/2015   Procedure: Laminectomy and Foraminotomy L4-L5 bilateral with CoFlex L5-S1 - left;  Surgeon: Karie Chimera, MD;  Location: [place] NEURO ORS;  Service: Neurosurgery;  Laterality: Bilateral;  . WOUND EXPLORATION N/A 09/27/2015   Procedure: WOUND EXPLORATION;  Surgeon: Kevan Ny Ditty, MD;  Location: [place] NEURO ORS;  Service: Neurosurgery;  Laterality: N/A;  WOUND EXPLORATION    Allergies  Allergen Reactions  . Tramadol Nausea Only and Nausea And Vomiting    Allergies as of 02/21/2017      Reactions   Tramadol Nausea Only, Nausea And Vomiting      Medication List       Accurate as of 02/21/17 11:59 PM. Always use your most recent med list.          acetaminophen 325 MG tablet Commonly known as:  TYLENOL Take 650 mg by mouth daily as needed.   amLODipine 10 MG tablet Commonly known as:  NORVASC Take 10 mg by mouth daily.   carvedilol 25 MG tablet Commonly known as:  COREG Take 1 tablet (25 mg total) by mouth 2 (two) times daily with a meal.   docusate sodium 100 MG capsule Commonly known as:  COLACE Take 2 capsules (200 mg total) by mouth 2 (two) times daily.   gabapentin 400 MG capsule Commonly known as:  NEURONTIN Take 400 mg by mouth 3 (three) times daily.   lisinopril 20 MG tablet  Commonly known as:  PRINIVIL,ZESTRIL Take 20 mg by mouth daily.   mirtazapine 30 MG tablet Commonly known as:  REMERON Take 1 tablet (30 mg total) by mouth at bedtime.   oxyCODONE-acetaminophen 5-325 MG tablet Commonly known as:  ROXICET Take 1 tablet by mouth every 4 (four) hours as needed for severe pain.   polyethylene glycol powder powder Commonly known as:  GLYCOLAX/MIRALAX Take 17 g by mouth daily.   potassium chloride SA 20 MEQ tablet Commonly known as:  KLOR-CON M20 Take 1 tablet (20 mEq total) by mouth daily.   senna 8.6 MG Tabs tablet Commonly known as:  SENOKOT Take 2 tablets (17.2 mg  total) by mouth 2 (two) times daily.       Review of Systems  Constitutional: Positive for appetite change. Negative for activity change, chills, diaphoresis and fever.  HENT: Negative for congestion, sneezing, sore throat, trouble swallowing and voice change.   Respiratory: Negative for apnea, cough, choking, chest tightness, shortness of breath and wheezing.   Cardiovascular: Negative for chest pain, palpitations and leg swelling.  Gastrointestinal: Positive for constipation. Negative for abdominal distention, abdominal pain, diarrhea and nausea.  Genitourinary: Negative for difficulty urinating, dysuria, frequency and urgency.  Musculoskeletal: Positive for arthralgias (typical arthritis). Negative for back pain, gait problem and myalgias.  Skin: Negative for color change, pallor, rash and wound.  Neurological: Negative for dizziness, tremors, syncope, speech difficulty, weakness, numbness and headaches.  Psychiatric/Behavioral: Positive for dysphoric mood. Negative for agitation and behavioral problems.  All other systems reviewed and are negative.    There is no immunization history on file for this patient. Pertinent  Health Maintenance Due  Topic Date Due  . FOOT EXAM  04/29/1944  . OPHTHALMOLOGY EXAM  04/29/1944  . DEXA SCAN  04/29/2001  . PNA vac Low Risk Adult (1 of 2 - PCV13) 04/29/2001  . HEMOGLOBIN A1C  03/17/2016  . INFLUENZA VACCINE  07/18/2017 (Originally 04/18/2017)   No flowsheet data found. Functional Status Survey:    Vitals:   02/15/17 0630  BP: (!) 123/44  Pulse: 75  Resp: 20  Temp: 98.3 F (36.8 C)  SpO2: 98%  Weight: 122 lb 8 oz (55.6 kg)   Body mass index is 22.41 kg/m. Physical Exam  Constitutional: She is oriented to person, place, and time. Vital signs are normal. She appears well-developed and well-nourished. She is active and cooperative. She does not appear ill. No distress.  HENT:  Head: Normocephalic and atraumatic.  Mouth/Throat: Uvula  is midline, oropharynx is clear and moist and mucous membranes are normal. Mucous membranes are not pale, not dry and not cyanotic.  Eyes: Conjunctivae, EOM and lids are normal. Pupils are equal, round, and reactive to light.  Neck: Trachea normal, normal range of motion and full passive range of motion without pain. Neck supple. No JVD present. No tracheal deviation, no edema and no erythema present. No thyromegaly present.  Cardiovascular: Normal rate, intact distal pulses and normal pulses.  An irregular rhythm present. Exam reveals no gallop, no distant heart sounds and no friction rub.   No murmur heard. Pulses:      Dorsalis pedis pulses are 2+ on the right side, and 2+ on the left side.  Pulmonary/Chest: Effort normal and breath sounds normal. No accessory muscle usage. No respiratory distress. She has no wheezes. She has no rales. She exhibits no tenderness.  Abdominal: Normal appearance and bowel sounds are normal. She exhibits no distension and no ascites. There is no tenderness.  Musculoskeletal: Normal range of motion. She exhibits no edema or tenderness.  Expected osteoarthritis, stiffness  Neurological: She is alert and oriented to person, place, and time. She has normal strength.  Skin: Skin is warm, dry and intact. No rash noted. She is not diaphoretic. No cyanosis or erythema. No pallor. Nails show no clubbing.  Psychiatric: Her speech is normal. Judgment and thought content normal. She is withdrawn. Cognition and memory are normal. She exhibits a depressed mood.  Nursing note and vitals reviewed.   Labs reviewed:  Recent Labs  10/30/16 1923 01/07/17 1421 01/08/17 0622  NA 136 138 136  K 4.5 3.9 4.0  CL 100* 98* 100*  CO2 29 31 30   GLUCOSE 192* 232* 214*  BUN 14 15 13   CREATININE 0.79 0.83 0.59  CALCIUM 9.5 10.2 9.6    Recent Labs  10/27/16 2328 10/30/16 1923 01/07/17 1421  AST 24 31 22   ALT 14 15 15   ALKPHOS 87 74 102  BILITOT 0.5 0.8 0.5  PROT 7.8 7.0  8.3*  ALBUMIN 3.8 3.4* 3.9    Recent Labs  10/30/16 1923 01/07/17 1421 01/08/17 0622  WBC 6.8 6.5 5.8  NEUTROABS 3.7 3.7  --   HGB 12.1 13.0 11.5*  HCT 34.9* 38.1 33.8*  MCV 83.7 82.0 82.6  PLT 318 393 356   No results found for: TSH Lab Results  Component Value Date   HGBA1C 8.9 (H) 09/17/2015   No results found for: CHOL, HDL, LDLCALC, LDLDIRECT, TRIG, CHOLHDL  Significant Diagnostic Results in last 30 days:  No results found.  Assessment/Plan 1. Lumbar stenosis with neurogenic claudication  Stable  See OA  2. Muscle weakness (generalized)  Stable  Mobilize with wheelchair  3. Hx of falling  Mobilize with wheelchair  Safety precautions  4. Adjustment disorder with mixed anxiety and depressed mood  Stable  Continue Depakote sprinkles 250 mg po TID  Continue Mirtazapine 15 mg po Q HS  5. Type 2 diabetes mellitus with diabetic neuropathy, without long-term current use of insulin (HCC)  Stable  Continue Gabapentin 400 mg po TID  6. Essential (primary) hypertension  Stable  Continue Amlodipine 10 mg po Q Day  Continue Lisinopril 10 mg po Q Day  7. Osteoarthritis, unspecified osteoarthritis type, unspecified site  Stable  Percocet 5/325 mg po Q 4 hours prn  8. Hypokalemia  Stable  Continue Potassium Chloride 20 meq po Q day  9. Personal history of malignant neoplasm of bronchus and lung  Stable   10. Constipation, unspecified constipation type  Stable  Continue Miralax 17 grams po Q Day  Continue Senna S 2 tablets po BID   Family/ staff Communication:   Total Time:  Documentation:  Face to Face:  Family/Phone:   Labs/tests ordered:    Medication list reviewed and assessed for continued appropriateness. Monthly medication orders reviewed and signed.  Vikki Ports, NP-C Geriatrics [facility] [phone], [place] [address] Cell Phone (Mon-Fri 8am-5pm):   [phone] On Call:  [phone] & follow prompts after 5pm & weekends Office Phone:  [phone] Office Fax:  [phone]

## 2017-03-18 ENCOUNTER — Encounter
Admission: RE | Admit: 2017-03-18 | Discharge: 2017-03-18 | Disposition: A | Discharge status: Home / Self Care | Payer: Medicare Other | Source: Ambulatory Visit | Attending: Internal Medicine | Admitting: Internal Medicine

## 2017-03-31 ENCOUNTER — Other Ambulatory Visit
Admission: RE | Admit: 2017-03-31 | Discharge: 2017-03-31 | Disposition: A | Discharge status: Home / Self Care | Payer: Medicare Other | Source: Skilled Nursing Facility | Attending: Internal Medicine | Admitting: Internal Medicine

## 2017-03-31 DIAGNOSIS — R350 Frequency of micturition: Secondary | ICD-10-CM | POA: Insufficient documentation

## 2017-03-31 LAB — URINALYSIS, COMPLETE (UACMP) WITH MICROSCOPIC
Bacteria, UA: NONE SEEN
Bilirubin Urine: NEGATIVE
Glucose, UA: >=500 mg/dL — AB
Hgb urine dipstick: NEGATIVE
KETONES UR: NEGATIVE mg/dL
LEUKOCYTES UA: NEGATIVE
Nitrite: NEGATIVE
PROTEIN: NEGATIVE mg/dL
Specific Gravity, Urine: 1.008 (ref 1.005–1.030)
pH: 6 (ref 5.0–8.0)

## 2017-04-01 LAB — URINE CULTURE: Culture: NO GROWTH

## 2017-04-04 ENCOUNTER — Encounter: Payer: Self-pay | Admitting: Gerontology

## 2017-04-04 ENCOUNTER — Non-Acute Institutional Stay (SKILLED_NURSING_FACILITY): Payer: Medicare Other | Admitting: Gerontology

## 2017-04-04 DIAGNOSIS — K5900 Constipation, unspecified: Secondary | ICD-10-CM

## 2017-04-04 DIAGNOSIS — I10 Essential (primary) hypertension: Secondary | ICD-10-CM | POA: Diagnosis not present

## 2017-04-04 DIAGNOSIS — Z9181 History of falling: Secondary | ICD-10-CM

## 2017-04-04 DIAGNOSIS — F4323 Adjustment disorder with mixed anxiety and depressed mood: Secondary | ICD-10-CM

## 2017-04-04 DIAGNOSIS — E1140 Type 2 diabetes mellitus with diabetic neuropathy, unspecified: Secondary | ICD-10-CM | POA: Diagnosis not present

## 2017-04-04 DIAGNOSIS — M6281 Muscle weakness (generalized): Secondary | ICD-10-CM | POA: Diagnosis not present

## 2017-04-04 DIAGNOSIS — Z85118 Personal history of other malignant neoplasm of bronchus and lung: Secondary | ICD-10-CM | POA: Diagnosis not present

## 2017-04-04 DIAGNOSIS — E876 Hypokalemia: Secondary | ICD-10-CM

## 2017-04-04 DIAGNOSIS — M1990 Unspecified osteoarthritis, unspecified site: Secondary | ICD-10-CM

## 2017-04-04 DIAGNOSIS — M48062 Spinal stenosis, lumbar region with neurogenic claudication: Secondary | ICD-10-CM

## 2017-04-04 NOTE — Progress Notes (Signed)
Location:   [place] Room Number: [place] of Service:  SNF [phone]) Provider:  Toni Arthurs, NP-C  Juluis Pitch, MD  Patient Care Team: Juluis Pitch, MD as PCP - General [facility] Medicine)  Extended Emergency Contact Information Primary Emergency Contact: [facility] Address: [address]          [place]          [place], [place] [address] Johnnette Litter of [place] Phone: [phone] Relation: Friend Secondary Emergency Contact: Francena Hanly States of [place] Phone: [phone] Relation: Friend  Code Status:  DNR Goals of care: Advanced Directive information Advanced Directives 04/04/2017  Does Patient Have a Medical Advance Directive? Yes  Type of Advance Directive Out of facility DNR (pink MOST or yellow form)  Does patient want to make changes to medical advance directive? No - Patient declined  Copy of [place] in Chart? -  Would patient like information on creating a medical advance directive? -     Chief Complaint  Patient presents with  . Medical Management of Chronic Issues    Routine Visit    HPI:  Pt is a 83 y.o. female seen today for medical management of chronic diseases. Pt has been stable over the past month. Her appetite is diminished. On Mirtazepine. Attempted to order Marinol last month for appetite stimulant, but insurance denied. Continue to encourage po food and fluid intake. Continue Glucerna BID. Pt reports she does not like the food. No falls this month. Pt does not socialize with others. She typically stays in her room and keeps to herself. Pt being followed by Palliative Care as well as [facility]. Pt was started on Depakote for mood stabilization, depression and irritability. Pt adamantly refused this. Will attempt Prozac as it will also help with the Diabteic Neuropathy. VSS. No other complaints.    Past Medical History:  Diagnosis Date  . Arthritis     . Back pain   . Cancer ([place])    Lung  . Diabetes mellitus without complication ([place])   . Diabetes mellitus, type 2 ([place])   . Hypertension   . Lumbar stenosis   . Osteoarthritis    Past Surgical History:  Procedure Laterality Date  . ABDOMINAL HYSTERECTOMY    . FINE NEEDLE ASPIRATION Left 09/06/2016   Procedure: FINE NEEDLE ASPIRATION; WITH IMAGING GUIDANCE; Surgeon: Marchia Bond, MD; [facility]: [facility]; Service: Pulmonary  . LAMINOTOMY  10/08/2015  . LUMBAR LAMINECTOMY WITH COFLEX 2 LEVEL Bilateral 09/23/2015   Procedure: Laminectomy and Foraminotomy L4-L5 bilateral with CoFlex L5-S1 - left;  Surgeon: Karie Chimera, MD;  Location: [place] NEURO ORS;  Service: Neurosurgery;  Laterality: Bilateral;  . WOUND EXPLORATION N/A 09/27/2015   Procedure: WOUND EXPLORATION;  Surgeon: Kevan Ny Ditty, MD;  Location: [place] NEURO ORS;  Service: Neurosurgery;  Laterality: N/A;  WOUND EXPLORATION    Allergies  Allergen Reactions  . Tramadol Nausea Only and Nausea And Vomiting    Allergies as of 04/04/2017      Reactions   Tramadol Nausea Only, Nausea And Vomiting      Medication List       Accurate as of 04/04/17  2:36 PM. Always use your most recent med list.          acetaminophen 325 MG tablet Commonly known as:  TYLENOL Take 650 mg by mouth every 6 (six) hours as needed. Do not exceed 3000 mg in 24 hour period  alum & mag hydroxide-simeth 938-182-99 MG/5ML suspension Commonly known as:  MAALOX PLUS Take 30 mLs by mouth every 4 (four) hours as needed for indigestion.   amLODipine 10 MG tablet Commonly known as:  NORVASC Take 10 mg by mouth daily.   carvedilol 25 MG tablet Commonly known as:  COREG Take 1 tablet (25 mg total) by mouth 2 (two) times daily with a meal.   gabapentin 400 MG capsule Commonly known as:  NEURONTIN Take 400 mg by mouth 3 (three) times daily.   GLUCERNA Liqd Take 237 mLs by mouth 2 (two) times daily.   guaiFENesin 100 MG/5ML liquid Commonly  known as:  ROBITUSSIN Take 200 mg by mouth every 4 (four) hours as needed for cough. Notify the provider if cough worsens/ continues longer than 3 days   lisinopril 10 MG tablet Commonly known as:  PRINIVIL,ZESTRIL Take 10 mg by mouth daily.   magnesium hydroxide 400 MG/5ML suspension Commonly known as:  MILK OF MAGNESIA Take 30 mLs by mouth every 4 (four) hours as needed for mild constipation. If Constipation/ no BM for 2 days   Menthol 2.7 MG Lozg Use as directed 1 lozenge in the mouth or throat every 2 (two) hours as needed.   mirtazapine 15 MG tablet Commonly known as:  REMERON Take 15 mg by mouth at bedtime.   oxyCODONE-acetaminophen 5-325 MG tablet Commonly known as:  ROXICET Take 1 tablet by mouth every 4 (four) hours as needed for severe pain.   polyethylene glycol powder powder Commonly known as:  GLYCOLAX/MIRALAX Take 17 g by mouth daily.   potassium chloride SA 20 MEQ tablet Commonly known as:  KLOR-CON M20 Take 1 tablet (20 mEq total) by mouth daily.   senna 8.6 MG Tabs tablet Commonly known as:  SENOKOT Take 2 tablets (17.2 mg total) by mouth 2 (two) times daily.       Review of Systems  Constitutional: Positive for appetite change. Negative for activity change, chills, diaphoresis and fever.  HENT: Negative for congestion, sneezing, sore throat, trouble swallowing and voice change.   Respiratory: Negative for apnea, cough, choking, chest tightness, shortness of breath and wheezing.   Cardiovascular: Negative for chest pain, palpitations and leg swelling.  Gastrointestinal: Positive for constipation. Negative for abdominal distention, abdominal pain, diarrhea and nausea.  Genitourinary: Negative for difficulty urinating, dysuria, frequency and urgency.  Musculoskeletal: Positive for arthralgias (typical arthritis), gait problem and myalgias. Negative for back pain.  Skin: Negative for color change, pallor, rash and wound.  Neurological: Negative for  dizziness, tremors, syncope, speech difficulty, weakness, numbness and headaches.  Psychiatric/Behavioral: Positive for dysphoric mood. Negative for agitation and behavioral problems.  All other systems reviewed and are negative.   Immunization History  Administered Date(s) Administered  . PPD Test 01/09/2017   Pertinent  Health Maintenance Due  Topic Date Due  . FOOT EXAM  04/29/1944  . OPHTHALMOLOGY EXAM  04/29/1944  . DEXA SCAN  04/29/2001  . PNA vac Low Risk Adult (1 of 2 - PCV13) 04/29/2001  . HEMOGLOBIN A1C  03/17/2016  . INFLUENZA VACCINE  07/18/2017 (Originally 04/18/2017)   No flowsheet data found. Functional Status Survey:    Vitals:   04/04/17 1353  BP: 128/68  Pulse: 77  Resp: 18  Temp: (!) 97.4 F (36.3 C)  SpO2: 100%  Weight: 120 lb 4.8 oz (54.6 kg)  Height: 5\' 2"  (1.575 m)   Body mass index is 22 kg/m. Physical Exam  Constitutional: She is oriented to person,  place, and time. Vital signs are normal. She appears well-developed and well-nourished. She is active and cooperative. She does not appear ill. No distress.  HENT:  Head: Normocephalic and atraumatic.  Mouth/Throat: Uvula is midline, oropharynx is clear and moist and mucous membranes are normal. Mucous membranes are not pale, not dry and not cyanotic.  Eyes: Pupils are equal, round, and reactive to light. Conjunctivae, EOM and lids are normal.  Neck: Trachea normal, normal range of motion and full passive range of motion without pain. Neck supple. No JVD present. No tracheal deviation, no edema and no erythema present. No thyromegaly present.  Cardiovascular: Normal rate, intact distal pulses and normal pulses.  An irregular rhythm present. Exam reveals no gallop, no distant heart sounds and no friction rub.   No murmur heard. Pulses:      Dorsalis pedis pulses are 2+ on the right side, and 2+ on the left side.  Pulmonary/Chest: Effort normal and breath sounds normal. No accessory muscle usage. No  respiratory distress. She has no wheezes. She has no rales. She exhibits no tenderness.  Abdominal: Normal appearance and bowel sounds are normal. She exhibits no distension and no ascites. There is no tenderness.  Musculoskeletal: Normal range of motion. She exhibits no edema or tenderness.  Expected osteoarthritis, stiffness  Neurological: She is alert and oriented to person, place, and time. She has normal strength.  Skin: Skin is warm, dry and intact. No rash noted. She is not diaphoretic. No cyanosis or erythema. No pallor. Nails show no clubbing.  Psychiatric: Her speech is normal. Judgment and thought content normal. She is agitated (irritable a lot of times.) and withdrawn. Cognition and memory are normal. She exhibits a depressed mood. She is inattentive.  Nursing note and vitals reviewed.   Labs reviewed:  Recent Labs  10/30/16 1923 01/07/17 1421 01/08/17 0622  NA 136 138 136  K 4.5 3.9 4.0  CL 100* 98* 100*  CO2 29 31 30   GLUCOSE 192* 232* 214*  BUN 14 15 13   CREATININE 0.79 0.83 0.59  CALCIUM 9.5 10.2 9.6    Recent Labs  10/27/16 2328 10/30/16 1923 01/07/17 1421  AST 24 31 22   ALT 14 15 15   ALKPHOS 87 74 102  BILITOT 0.5 0.8 0.5  PROT 7.8 7.0 8.3*  ALBUMIN 3.8 3.4* 3.9    Recent Labs  10/30/16 1923 01/07/17 1421 01/08/17 0622  WBC 6.8 6.5 5.8  NEUTROABS 3.7 3.7  --   HGB 12.1 13.0 11.5*  HCT 34.9* 38.1 33.8*  MCV 83.7 82.0 82.6  PLT 318 393 356   No results found for: TSH Lab Results  Component Value Date   HGBA1C 8.9 (H) 09/17/2015   No results found for: CHOL, HDL, LDLCALC, LDLDIRECT, TRIG, CHOLHDL  Significant Diagnostic Results in last 30 days:  No results found.  Assessment/Plan 1. Lumbar stenosis with neurogenic claudication  Stable  See OA  2. Muscle weakness (generalized)  Stable  Mobilize with wheelchair  3. Hx of falling  Mobilize with wheelchair  Safety precautions  4. Adjustment disorder with mixed anxiety and  depressed mood  Stable  Discontinue Depakote sprinkles 250 mg po TID- pt adamantly refusing  Continue Mirtazapine 15 mg po Q HS  Try Prozac 10 mg po Q Day (with emphasis to the pt that will help the leg pain)  5. Type 2 diabetes mellitus with diabetic neuropathy, without long-term current use of insulin (HCC)  Stable  Continue Gabapentin 400 mg po TID  6.  Essential (primary) hypertension  Stable  Continue Amlodipine 10 mg po Q Day  Continue Lisinopril 10 mg po Q Day  7. Osteoarthritis, unspecified osteoarthritis type, unspecified site  Stable  Percocet 5/325 mg po Q 4 hours prn  8. Hypokalemia  Stable  Continue Potassium Chloride 20 meq po Q day  9. Personal history of malignant neoplasm of bronchus and lung  Stable   10. Constipation, unspecified constipation type  Stable  Continue Miralax 17 grams po Q Day  Continue Senna S 2 tablets po BID   Family/ staff Communication:   Total Time:  Documentation:  Face to Face:  Family/Phone:   Labs/tests ordered:    Medication list reviewed and assessed for continued appropriateness. Monthly medication orders reviewed and signed.  Vikki Ports, NP-C Geriatrics [facility] [phone], [place] [address] Cell Phone (Mon-Fri 8am-5pm):  [phone] On Call:  [phone] & follow prompts after 5pm & weekends Office Phone:  [phone] Office Fax:  [phone]

## 2017-04-18 ENCOUNTER — Encounter
Admission: RE | Admit: 2017-04-18 | Discharge: 2017-04-18 | Disposition: A | Discharge status: Home / Self Care | Payer: Medicare HMO | Source: Ambulatory Visit | Attending: Internal Medicine | Admitting: Internal Medicine

## 2017-05-14 ENCOUNTER — Encounter: Payer: Self-pay | Admitting: Gerontology

## 2017-05-14 ENCOUNTER — Non-Acute Institutional Stay (SKILLED_NURSING_FACILITY): Payer: Medicare Other | Admitting: Gerontology

## 2017-05-14 DIAGNOSIS — M48062 Spinal stenosis, lumbar region with neurogenic claudication: Secondary | ICD-10-CM | POA: Diagnosis not present

## 2017-05-14 DIAGNOSIS — I10 Essential (primary) hypertension: Secondary | ICD-10-CM

## 2017-05-14 DIAGNOSIS — Z9181 History of falling: Secondary | ICD-10-CM

## 2017-05-14 DIAGNOSIS — Z85118 Personal history of other malignant neoplasm of bronchus and lung: Secondary | ICD-10-CM | POA: Diagnosis not present

## 2017-05-14 DIAGNOSIS — M6281 Muscle weakness (generalized): Secondary | ICD-10-CM | POA: Diagnosis not present

## 2017-05-14 DIAGNOSIS — M1990 Unspecified osteoarthritis, unspecified site: Secondary | ICD-10-CM | POA: Diagnosis not present

## 2017-05-14 DIAGNOSIS — E1140 Type 2 diabetes mellitus with diabetic neuropathy, unspecified: Secondary | ICD-10-CM | POA: Diagnosis not present

## 2017-05-14 DIAGNOSIS — E876 Hypokalemia: Secondary | ICD-10-CM

## 2017-05-14 DIAGNOSIS — K5900 Constipation, unspecified: Secondary | ICD-10-CM

## 2017-05-14 DIAGNOSIS — F4323 Adjustment disorder with mixed anxiety and depressed mood: Secondary | ICD-10-CM

## 2017-05-14 NOTE — Progress Notes (Signed)
Location:   [facility] [place] Room Number: [place] of Service:    Provider:  Toni Arthurs, NP-C  Juluis Pitch, MD  Patient Care Team: Juluis Pitch, MD as PCP - General [facility] Medicine)  Extended Emergency Contact Information Primary Emergency Contact: [facility] Address: [address]          [place], [place] [address] Johnnette Litter of [place] Phone: [phone] Relation: Friend Secondary Emergency Contact: Francena Hanly [place] of [place] Phone: [phone] Relation: Friend  Code Status:  DNR Goals of care: Advanced Directive information Advanced Directives 05/14/2017  Does Patient Have a Medical Advance Directive? Yes  Type of Advance Directive Out of facility DNR (pink MOST or yellow form)  Does patient want to make changes to medical advance directive? No - Patient declined  Copy of [place] in Chart? -  Would patient like information on creating a medical advance directive? -     Chief Complaint  Patient presents with  . Medical Management of Chronic Issues    Routine Visit    HPI:  Pt is a 83 y.o. female seen today for medical management of chronic diseases. Pt has been stable over the past month. Her appetite is diminished. On Mirtazepine. Continue to encourage po food and fluid intake. Continue Glucerna BID. Pt reports she does not like the food. No falls this month. Pt does not socialize with others. She typically stays in her room and keeps to herself. Pt being followed by Palliative Care as well as [facility]. Pt was started on Prozac for mood stabilization, depression and irritability as well as it will also help with the Diabteic Neuropathy. Pt c/o chronic back pain that is worsened by immobility. Offered pt Lidocaine patch. Pt is agreeable to trying this. Pr also displaying signs of worsening depression. Verbalizing her mortality, feeling like she can feel  her lung cancer. More solemn appearing. VSS. No other complaints.    Past Medical History:  Diagnosis Date  . Arthritis   . Back pain   . Cancer ([place])    Lung  . Diabetes mellitus without complication ([place])   . Diabetes mellitus, type 2 ([place])   . Hypertension   . Lumbar stenosis   . Osteoarthritis    Past Surgical History:  Procedure Laterality Date  . ABDOMINAL HYSTERECTOMY    . FINE NEEDLE ASPIRATION Left 09/06/2016   Procedure: FINE NEEDLE ASPIRATION; WITH IMAGING GUIDANCE; Surgeon: Marchia Bond, MD; Location: MAIN OR [facility]; Service: Pulmonary  . LAMINOTOMY  10/08/2015  . LUMBAR LAMINECTOMY WITH COFLEX 2 LEVEL Bilateral 09/23/2015   Procedure: Laminectomy and Foraminotomy L4-L5 bilateral with CoFlex L5-S1 - left;  Surgeon: Karie Chimera, MD;  Location: [place] NEURO ORS;  Service: Neurosurgery;  Laterality: Bilateral;  . WOUND EXPLORATION N/A 09/27/2015   Procedure: WOUND EXPLORATION;  Surgeon: Kevan Ny Ditty, MD;  Location: [place] NEURO ORS;  Service: Neurosurgery;  Laterality: N/A;  WOUND EXPLORATION    Allergies  Allergen Reactions  . Tramadol Nausea Only and Nausea And Vomiting    Allergies as of 05/14/2017      Reactions   Tramadol Nausea Only, Nausea And Vomiting      Medication List       Accurate as of 05/14/17 12:38 PM. Always use your most recent med list.          acetaminophen 325 MG tablet Commonly known as:  TYLENOL Take 650 mg by mouth every 6 (six) hours as needed. Do not exceed 3000 mg in 24 hour period   alum & mag hydroxide-simeth 400-400-40 MG/5ML suspension Commonly known as:  MAALOX PLUS Take 30 mLs by mouth every 4 (four) hours as needed for indigestion.   amLODipine 10 MG tablet Commonly known as:  NORVASC Take 10 mg by mouth daily.   benzonatate 100 MG capsule Commonly known as:  TESSALON Take 100 mg by mouth 3 (three) times daily as needed for cough.   carvedilol 25 MG tablet Commonly known as:  COREG Take 1 tablet (25 mg total) by  mouth 2 (two) times daily with a meal.   FLUoxetine 10 MG capsule Commonly known as:  PROZAC Take 10 mg by mouth daily.   gabapentin 400 MG capsule Commonly known as:  NEURONTIN Take 400 mg by mouth every 8 (eight) hours.   GLUCERNA Liqd Take 237 mLs by mouth 2 (two) times daily.   guaiFENesin 100 MG/5ML liquid Commonly known as:  ROBITUSSIN Take 200 mg by mouth every 4 (four) hours as needed for cough. Notify the provider if cough worsens/ continues longer than 3 days   lisinopril 10 MG tablet Commonly known as:  PRINIVIL,ZESTRIL Take 10 mg by mouth daily.   magnesium hydroxide 400 MG/5ML suspension Commonly known as:  MILK OF MAGNESIA Take 30 mLs by mouth every 4 (four) hours as needed for mild constipation. If Constipation/ no BM for 2 days   Menthol 2.7 MG Lozg Use as directed 1 lozenge in the mouth or throat every 2 (two) hours as needed.   mirtazapine 15 MG tablet Commonly known as:  REMERON Take 15 mg by mouth at bedtime.   oxyCODONE-acetaminophen 5-325 MG tablet Commonly known as:  ROXICET Take 1 tablet by mouth every 4 (four) hours as needed for severe pain.   polyethylene glycol powder powder Commonly known as:  GLYCOLAX/MIRALAX Take 17 g by mouth daily.   potassium chloride SA 20 MEQ tablet Commonly known as:  KLOR-CON M20 Take 1 tablet (20 mEq total) by mouth daily.   sennosides-docusate sodium 8.6-50 MG tablet Commonly known as:  SENOKOT-S Take 2 tablets by mouth 2 (two) times daily.       Review of Systems  Constitutional: Negative for activity change, chills, diaphoresis and fever.  HENT: Negative for congestion, sneezing, sore throat, trouble swallowing and voice change.   Respiratory: Negative for apnea, cough, choking, chest tightness, shortness of breath and wheezing.   Cardiovascular: Negative for chest pain, palpitations and leg swelling.  Gastrointestinal: Negative for abdominal distention, abdominal pain, constipation, diarrhea and  nausea.  Genitourinary: Negative for difficulty urinating, dysuria, frequency and urgency.  Musculoskeletal: Positive for arthralgias (typical arthritis), gait problem and myalgias. Negative for back pain.  Skin: Negative for color change, pallor, rash and wound.  Neurological: Negative for dizziness, tremors, syncope, speech difficulty, weakness, numbness and headaches.  Psychiatric/Behavioral: Positive for dysphoric mood. Negative for agitation and behavioral problems.  All other systems reviewed and are negative.   Immunization History  Administered Date(s) Administered  . PPD Test 01/09/2017   Pertinent  Health Maintenance Due  Topic Date Due  . FOOT EXAM  04/29/1944  . OPHTHALMOLOGY EXAM  04/29/1944  . DEXA SCAN  04/29/2001  . PNA vac Low Risk Adult (1 of 2 - PCV13) 04/29/2001  . HEMOGLOBIN A1C  03/17/2016  . INFLUENZA VACCINE  07/18/2017 (Originally 04/18/2017)   No flowsheet data found. Functional Status Survey:  Vitals:   05/14/17 1222  BP: (!) 115/45  Pulse: 68  Resp: 18  Temp: 98.2 F (36.8 C)  SpO2: 99%  Weight: 124 lb 1.6 oz (56.3 kg)  Height: 5\' 2"  (1.575 m)   Body mass index is 22.7 kg/m. Physical Exam  Constitutional: She is oriented to person, place, and time. Vital signs are normal. She appears well-developed and well-nourished. She is active and cooperative. She does not appear ill. No distress.  HENT:  Head: Normocephalic and atraumatic.  Mouth/Throat: Uvula is midline, oropharynx is clear and moist and mucous membranes are normal. Mucous membranes are not pale, not dry and not cyanotic.  Eyes: Pupils are equal, round, and reactive to light. Conjunctivae, EOM and lids are normal.  Neck: Trachea normal, normal range of motion and full passive range of motion without pain. Neck supple. No JVD present. No tracheal deviation, no edema and no erythema present. No thyromegaly present.  Cardiovascular: Normal rate, intact distal pulses and normal pulses.  An  irregular rhythm present. Exam reveals no gallop, no distant heart sounds and no friction rub.   No murmur heard. Pulses:      Dorsalis pedis pulses are 2+ on the right side, and 2+ on the left side.  Pulmonary/Chest: Effort normal and breath sounds normal. No accessory muscle usage. No respiratory distress. She has no wheezes. She has no rales. She exhibits no tenderness.  Abdominal: Normal appearance and bowel sounds are normal. She exhibits no distension and no ascites. There is no tenderness.  Musculoskeletal: Normal range of motion. She exhibits no edema or tenderness.       Thoracic back: She exhibits pain (chronic).  Expected osteoarthritis, stiffness  Neurological: She is alert and oriented to person, place, and time. She has normal strength.  Skin: Skin is warm, dry and intact. No rash noted. She is not diaphoretic. No cyanosis or erythema. No pallor. Nails show no clubbing.  Psychiatric: Her speech is normal. Judgment and thought content normal. She is agitated (irritable a lot of times.) and withdrawn. Cognition and memory are normal. She exhibits a depressed mood. She is inattentive.  Nursing note and vitals reviewed.   Labs reviewed:  Recent Labs  10/30/16 1923 01/07/17 1421 01/08/17 0622  NA 136 138 136  K 4.5 3.9 4.0  CL 100* 98* 100*  CO2 29 31 30   GLUCOSE 192* 232* 214*  BUN 14 15 13   CREATININE 0.79 0.83 0.59  CALCIUM 9.5 10.2 9.6    Recent Labs  10/27/16 2328 10/30/16 1923 01/07/17 1421  AST 24 31 22   ALT 14 15 15   ALKPHOS 87 74 102  BILITOT 0.5 0.8 0.5  PROT 7.8 7.0 8.3*  ALBUMIN 3.8 3.4* 3.9    Recent Labs  10/30/16 1923 01/07/17 1421 01/08/17 0622  WBC 6.8 6.5 5.8  NEUTROABS 3.7 3.7  --   HGB 12.1 13.0 11.5*  HCT 34.9* 38.1 33.8*  MCV 83.7 82.0 82.6  PLT 318 393 356   No results found for: TSH Lab Results  Component Value Date   HGBA1C 8.9 (H) 09/17/2015   No results found for: CHOL, HDL, LDLCALC, LDLDIRECT, TRIG,  CHOLHDL  Significant Diagnostic Results in last 30 days:  No results found.  Assessment/Plan 1. Lumbar stenosis with neurogenic claudication  Stable  See OA  Aspercreme 4% Lidocaine patch- remove after 12 hours  2. Muscle weakness (generalized)  Stable  Mobilize with wheelchair  3. Hx of falling  Mobilize with wheelchair  Safety precautions  4. Adjustment disorder with mixed anxiety and depressed mood  Stable  Continue Mirtazapine 15 mg po Q HS  Increase Prozac 20 mg po Q Day (with emphasis to the pt that will help the leg pain)  5. Type 2 diabetes mellitus with diabetic neuropathy, without long-term current use of insulin (HCC)  Stable  Continue Gabapentin 400 mg po TID  6. Essential (primary) hypertension  Stable  Continue Amlodipine 10 mg po Q Day  Continue Lisinopril 10 mg po Q Day  7. Osteoarthritis, unspecified osteoarthritis type, unspecified site  Stable  Percocet 5/325 mg po Q 4 hours prn  8. Hypokalemia  Stable  Continue Potassium Chloride 20 meq po Q day  9. Personal history of malignant neoplasm of bronchus and lung  Stable   10. Constipation, unspecified constipation type  Stable  Continue Miralax 17 grams po Q Day  Continue Senna S 2 tablets po BID   Family/ staff Communication:   Total Time:  Documentation:  Face to Face:  Family/Phone:   Labs/tests ordered:    Medication list reviewed and assessed for continued appropriateness. Monthly medication orders reviewed and signed.  Vikki Ports, NP-C Geriatrics [facility] [phone], [place] [address] Cell Phone (Mon-Fri 8am-5pm):  [phone] On Call:  [phone] & follow prompts after 5pm & weekends Office Phone:  [phone] Office Fax:  [phone]

## 2017-05-15 ENCOUNTER — Other Ambulatory Visit
Admission: RE | Admit: 2017-05-15 | Discharge: 2017-05-15 | Disposition: A | Discharge status: Home / Self Care | Payer: Medicare Other | Source: Ambulatory Visit | Attending: Gerontology | Admitting: Gerontology

## 2017-05-15 DIAGNOSIS — R350 Frequency of micturition: Secondary | ICD-10-CM | POA: Insufficient documentation

## 2017-05-15 LAB — BASIC METABOLIC PANEL
ANION GAP: 8 (ref 5–15)
BUN: 14 mg/dL (ref 6–20)
CALCIUM: 9.7 mg/dL (ref 8.9–10.3)
CO2: 28 mmol/L (ref 22–32)
Chloride: 102 mmol/L (ref 101–111)
Creatinine, Ser: 0.49 mg/dL (ref 0.44–1.00)
GFR calc Af Amer: >60 mL/min (ref >60)
GFR calc non Af Amer: >60 mL/min (ref >60)
GLUCOSE: 221 mg/dL — AB (ref 65–99)
Potassium: 3.9 mmol/L (ref 3.5–5.1)
Sodium: 138 mmol/L (ref 135–145)

## 2017-05-19 ENCOUNTER — Encounter
Admission: RE | Admit: 2017-05-19 | Discharge: 2017-05-19 | Disposition: A | Discharge status: Home / Self Care | Payer: Medicare Other | Source: Ambulatory Visit | Attending: Internal Medicine | Admitting: Internal Medicine

## 2017-05-25 ENCOUNTER — Non-Acute Institutional Stay (SKILLED_NURSING_FACILITY): Payer: Medicare Other | Admitting: Gerontology

## 2017-05-25 DIAGNOSIS — T148XXA Other injury of unspecified body region, initial encounter: Secondary | ICD-10-CM | POA: Diagnosis not present

## 2017-06-08 ENCOUNTER — Encounter: Payer: Self-pay | Admitting: Gerontology

## 2017-06-08 NOTE — Progress Notes (Signed)
Location:   [facility] [place] Room Number: [place] of Service:  SNF [phone]) Provider:  Toni Arthurs, NP-C  Juluis Pitch, MD  Patient Care Team: Juluis Pitch, MD as PCP - General [facility] Medicine)  Extended Emergency Contact Information Primary Emergency Contact: [facility] Address: [address]          [place]          [place], [place] [address] Johnnette Litter of [place] Phone: [phone] Relation: Friend Secondary Emergency Contact: Francena Hanly [place] of [place] Phone: [phone] Relation: Friend  Code Status:  DNR Goals of care: Advanced Directive information Advanced Directives 05/25/2017  Does Patient Have a Medical Advance Directive? Yes  Type of Advance Directive Out of facility DNR (pink MOST or yellow form)  Does patient want to make changes to medical advance directive? No - Patient declined  Copy of [place] in Chart? -  Would patient like information on creating a medical advance directive? -     Chief Complaint  Patient presents with  . Acute Visit    Follow up on wound    HPI:  Pt is a 83 y.o. female seen today for an acute visit for evaluation of heel wound. Pt is bedbound with poor bed mobility. Pt c/o Left heel hurting. Area on posterior left heel approx. 2x2 cm of purple bruising. Area tender to palpation. No break in skin integrity. Non-blanchable. No other areas of skin breakdown. VSS. No other complaints.    Past Medical History:  Diagnosis Date  . Arthritis   . Back pain   . Cancer ([place])    Lung  . Diabetes mellitus without complication ([place])   . Diabetes mellitus, type 2 ([place])   . Hypertension   . Lumbar stenosis   . Osteoarthritis    Past Surgical History:  Procedure Laterality Date  . ABDOMINAL HYSTERECTOMY    . FINE NEEDLE ASPIRATION Left 09/06/2016   Procedure: FINE NEEDLE ASPIRATION; WITH IMAGING GUIDANCE; Surgeon: Marchia Bond, MD; [facility];  Service: Pulmonary  . LAMINOTOMY  10/08/2015  . LUMBAR LAMINECTOMY WITH COFLEX 2 LEVEL Bilateral 09/23/2015   Procedure: Laminectomy and Foraminotomy L4-L5 bilateral with CoFlex L5-S1 - left;  Surgeon: Karie Chimera, MD;  Location: [place] NEURO ORS;  Service: Neurosurgery;  Laterality: Bilateral;  . WOUND EXPLORATION N/A 09/27/2015   Procedure: WOUND EXPLORATION;  Surgeon: Kevan Ny Ditty, MD;  Location: [place] NEURO ORS;  Service: Neurosurgery;  Laterality: N/A;  WOUND EXPLORATION    Allergies  Allergen Reactions  . Tramadol Nausea Only and Nausea And Vomiting    Allergies as of 05/25/2017      Reactions   Tramadol Nausea Only, Nausea And Vomiting      Medication List       Accurate as of 05/25/17 11:59 PM. Always use your most recent med list.          acetaminophen 325 MG tablet Commonly known as:  TYLENOL Take 650 mg by mouth every 6 (six) hours as needed. Do not exceed 3000 mg in 24 hour period   alum & mag hydroxide-simeth 400-400-40 MG/5ML suspension Commonly known as:  MAALOX PLUS Take 30 mLs by mouth every 4 (four) hours as needed for indigestion.   amLODipine 10 MG tablet Commonly known as:  NORVASC Take 10 mg by mouth daily.   ASPERCREME LIDOCAINE 4 % Ptch Generic drug:  Lidocaine Apply 1 patch topically daily. Apply patch to area of  pain on the back. Remove after 12 hours.   benzonatate 100 MG capsule Commonly known as:  TESSALON Take 100 mg by mouth 3 (three) times daily as needed for cough.   carvedilol 25 MG tablet Commonly known as:  COREG Take 1 tablet (25 mg total) by mouth 2 (two) times daily with a meal.   FLUoxetine 20 MG tablet Commonly known as:  PROZAC Take 20 mg by mouth daily.   gabapentin 400 MG capsule Commonly known as:  NEURONTIN Take 400 mg by mouth every 8 (eight) hours.   GLUCERNA Liqd Take 237 mLs by mouth 2 (two) times daily.   guaiFENesin 100 MG/5ML liquid Commonly known as:  ROBITUSSIN Take 200 mg by mouth every 4 (four) hours  as needed for cough. Notify the provider if cough worsens/ continues longer than 3 days   lisinopril 10 MG tablet Commonly known as:  PRINIVIL,ZESTRIL Take 10 mg by mouth daily.   magnesium hydroxide 400 MG/5ML suspension Commonly known as:  MILK OF MAGNESIA Take 30 mLs by mouth every 4 (four) hours as needed for mild constipation. If Constipation/ no BM for 2 days   Menthol 2.7 MG Lozg Use as directed 1 lozenge in the mouth or throat every 2 (two) hours as needed.   mirtazapine 15 MG tablet Commonly known as:  REMERON Take 15 mg by mouth at bedtime.   oxyCODONE-acetaminophen 5-325 MG tablet Commonly known as:  ROXICET Take 1 tablet by mouth every 4 (four) hours as needed for severe pain.   polyethylene glycol powder powder Commonly known as:  GLYCOLAX/MIRALAX Take 17 g by mouth daily.   potassium chloride SA 20 MEQ tablet Commonly known as:  KLOR-CON M20 Take 1 tablet (20 mEq total) by mouth daily.   sennosides-docusate sodium 8.6-50 MG tablet Commonly known as:  SENOKOT-S Take 2 tablets by mouth 2 (two) times daily.       Review of Systems  Skin: Positive for wound.  All other systems reviewed and are negative.   Immunization History  Administered Date(s) Administered  . PPD Test 01/09/2017   Pertinent  Health Maintenance Due  Topic Date Due  . FOOT EXAM  04/29/1944  . OPHTHALMOLOGY EXAM  04/29/1944  . DEXA SCAN  04/29/2001  . PNA vac Low Risk Adult (1 of 2 - PCV13) 04/29/2001  . HEMOGLOBIN A1C  03/17/2016  . INFLUENZA VACCINE  07/18/2017 (Originally 04/18/2017)   No flowsheet data found. Functional Status Survey:    Vitals:   05/25/17 1451  BP: (!) 104/51  Pulse: 68  Resp: 18  Temp: 98.8 F (37.1 C)  SpO2: 99%  Weight: 125 lb (56.7 kg)  Height: 5\' 2"  (1.575 m)   Body mass index is 22.86 kg/m. Physical Exam  Constitutional: She appears well-developed and well-nourished.  Pulmonary/Chest: Effort normal.  Skin: Skin is warm and dry. Ecchymosis  noted. She is not diaphoretic.       Labs reviewed:  Recent Labs  01/07/17 1421 01/08/17 0622 05/15/17 0525  NA 138 136 138  K 3.9 4.0 3.9  CL 98* 100* 102  CO2 31 30 28   GLUCOSE 232* 214* 221*  BUN 15 13 14   CREATININE 0.83 0.59 0.49  CALCIUM 10.2 9.6 9.7    Recent Labs  10/27/16 2328 10/30/16 1923 01/07/17 1421  AST 24 31 22   ALT 14 15 15   ALKPHOS 87 74 102  BILITOT 0.5 0.8 0.5  PROT 7.8 7.0 8.3*  ALBUMIN 3.8 3.4* 3.9    Recent  Labs  10/30/16 1923 01/07/17 1421 01/08/17 0622  WBC 6.8 6.5 5.8  NEUTROABS 3.7 3.7  --   HGB 12.1 13.0 11.5*  HCT 34.9* 38.1 33.8*  MCV 83.7 82.0 82.6  PLT 318 393 356   No results found for: TSH Lab Results  Component Value Date   HGBA1C 8.9 (H) 09/17/2015   No results found for: CHOL, HDL, LDLCALC, LDLDIRECT, TRIG, CHOLHDL  Significant Diagnostic Results in last 30 days:  No results found.  Assessment/Plan 1. Deep tissue injury  Allevyn patch to the left heel. Change Q 5 days  Elevate heels off the bed at all times  Skin prep to heels BID  Encourage use of Prevelon boots  Continue Glucerna supplements BID  GeoMat to the bed  Family/ staff Communication:   Total Time:  Documentation:  Face to Face:  Family/Phone:   Labs/tests ordered:    Medication list reviewed and assessed for continued appropriateness.  Vikki Ports, NP-C Geriatrics [facility] [phone], [place] [address] Cell Phone (Mon-Fri 8am-5pm):  [phone] On Call:  [phone] & follow prompts after 5pm & weekends Office Phone:  [phone] Office Fax:  [phone]

## 2017-06-11 ENCOUNTER — Encounter: Payer: Self-pay | Admitting: Gerontology

## 2017-06-11 ENCOUNTER — Non-Acute Institutional Stay (SKILLED_NURSING_FACILITY): Payer: Medicare Other | Admitting: Gerontology

## 2017-06-11 DIAGNOSIS — I10 Essential (primary) hypertension: Secondary | ICD-10-CM | POA: Diagnosis not present

## 2017-06-11 DIAGNOSIS — K5900 Constipation, unspecified: Secondary | ICD-10-CM | POA: Diagnosis not present

## 2017-06-11 DIAGNOSIS — M48062 Spinal stenosis, lumbar region with neurogenic claudication: Secondary | ICD-10-CM

## 2017-06-11 DIAGNOSIS — E876 Hypokalemia: Secondary | ICD-10-CM | POA: Diagnosis not present

## 2017-06-11 DIAGNOSIS — T148XXA Other injury of unspecified body region, initial encounter: Secondary | ICD-10-CM | POA: Diagnosis not present

## 2017-06-11 DIAGNOSIS — F4323 Adjustment disorder with mixed anxiety and depressed mood: Secondary | ICD-10-CM

## 2017-06-11 DIAGNOSIS — Z9181 History of falling: Secondary | ICD-10-CM | POA: Diagnosis not present

## 2017-06-11 DIAGNOSIS — Z85118 Personal history of other malignant neoplasm of bronchus and lung: Secondary | ICD-10-CM

## 2017-06-11 DIAGNOSIS — M1990 Unspecified osteoarthritis, unspecified site: Secondary | ICD-10-CM

## 2017-06-11 DIAGNOSIS — E1140 Type 2 diabetes mellitus with diabetic neuropathy, unspecified: Secondary | ICD-10-CM | POA: Diagnosis not present

## 2017-06-11 DIAGNOSIS — M6281 Muscle weakness (generalized): Secondary | ICD-10-CM

## 2017-06-11 NOTE — Progress Notes (Signed)
Location:   [facility] Room Number: [place] of Service:  SNF [phone]) Provider:  Toni Arthurs, NP-C  Juluis Pitch, MD  Patient Care Team: Juluis Pitch, MD as PCP - [facility])  Extended Emergency Contact Information Primary Emergency Contact: [facility] Address: [address]          [place]          [place], [place] [address] Johnnette Litter of [place] Phone: [phone] Relation: Friend Secondary Emergency Contact: Francena Hanly [place] of [place] Phone: [phone] Relation: Friend  Code Status:  DNR Goals of care: Advanced Directive information Advanced Directives 06/11/2017  Does Patient Have a Medical Advance Directive? Yes  Type of Advance Directive Out of facility DNR (pink MOST or yellow form)  Does patient want to make changes to medical advance directive? No - Patient declined  Copy of [place] in Chart? -  Would patient like information on creating a medical advance directive? -     Chief Complaint  Patient presents with  . Medical Management of Chronic Issues    Routine Visit    HPI:  Pt is a 83 y.o. female seen today for medical management of chronic diseases. Pt has been stable over the past month. Her appetite is diminished. On Mirtazepine. Continue to encourage po food and fluid intake. Continue Glucerna BID. Pt reports she does not like the food. No falls this month. Pt does not socialize with others. She typically stays in her room and keeps to herself. Pt being followed by Palliative Care as well as [facility]. Pt was started on Prozac for mood stabilization, depression and irritability as well as it will also help with the Diabteic Neuropathy. This has improved symptoms somewhat. Pt c/o chronic back pain that is worsened by immobility. Offered pt Lidocaine patch. Pt is agreeable to trying this. Pr also displaying signs of worsening depression.  Verbalizing her mortality, feeling like she can feel her lung cancer. More solemn appearing. More paranoid and irritable. Refusing care. Called the Police on CNA saying "she stole a piece of paper." VSS. No other complaints.    Past Medical History:  Diagnosis Date  . Arthritis   . Back pain   . Cancer ([place])    Lung  . Diabetes mellitus without complication ([place])   . Diabetes mellitus, type 2 ([place])   . Hypertension   . Lumbar stenosis   . Osteoarthritis    Past Surgical History:  Procedure Laterality Date  . ABDOMINAL HYSTERECTOMY    . FINE NEEDLE ASPIRATION Left 09/06/2016   Procedure: FINE NEEDLE ASPIRATION; WITH IMAGING GUIDANCE; Surgeon: Marchia Bond, MD; Location: MAIN OR [facility]; Service: Pulmonary  . LAMINOTOMY  10/08/2015  . LUMBAR LAMINECTOMY WITH COFLEX 2 LEVEL Bilateral 09/23/2015   Procedure: Laminectomy and Foraminotomy L4-L5 bilateral with CoFlex L5-S1 - left;  Surgeon: Karie Chimera, MD;  Location: [facility] NEURO ORS;  Service: Neurosurgery;  Laterality: Bilateral;  . WOUND EXPLORATION N/A 09/27/2015   Procedure: WOUND EXPLORATION;  Surgeon: Kevan Ny Ditty, MD;  Location: [place] NEURO ORS;  Service: Neurosurgery;  Laterality: N/A;  WOUND EXPLORATION    Allergies  Allergen Reactions  . Tramadol Nausea Only and Nausea And Vomiting    Allergies as of 06/11/2017      Reactions   Tramadol Nausea Only, Nausea And Vomiting      Medication List       Accurate as of 06/11/17 10:24 AM.  Always use your most recent med list.          acetaminophen 325 MG tablet Commonly known as:  TYLENOL Take 650 mg by mouth every 6 (six) hours as needed. Do not exceed 3000 mg in 24 hour period   alum & mag hydroxide-simeth 400-400-40 MG/5ML suspension Commonly known as:  MAALOX PLUS Take 30 mLs by mouth every 4 (four) hours as needed for indigestion.   amLODipine 10 MG tablet Commonly known as:  NORVASC Take 10 mg by mouth daily.   ASPERCREME LIDOCAINE 4 % Ptch Generic drug:   Lidocaine Apply 1 patch topically daily. Apply patch to area of pain on the back. Remove after 12 hours.   benzonatate 100 MG capsule Commonly known as:  TESSALON Take 100 mg by mouth 3 (three) times daily as needed for cough.   carvedilol 25 MG tablet Commonly known as:  COREG Take 1 tablet (25 mg total) by mouth 2 (two) times daily with a meal.   FLUoxetine 20 MG tablet Commonly known as:  PROZAC Take 20 mg by mouth daily.   gabapentin 400 MG capsule Commonly known as:  NEURONTIN Take 400 mg by mouth every 8 (eight) hours.   GLUCERNA Liqd Take 237 mLs by mouth 2 (two) times daily.   guaiFENesin 100 MG/5ML liquid Commonly known as:  ROBITUSSIN Take 200 mg by mouth every 4 (four) hours as needed for cough. Notify the provider if cough worsens/ continues longer than 3 days   lisinopril 10 MG tablet Commonly known as:  PRINIVIL,ZESTRIL Take 10 mg by mouth daily.   magnesium hydroxide 400 MG/5ML suspension Commonly known as:  MILK OF MAGNESIA Take 30 mLs by mouth every 4 (four) hours as needed for mild constipation. If Constipation/ no BM for 2 days   Menthol 2.7 MG Lozg Use as directed 1 lozenge in the mouth or throat every 2 (two) hours as needed.   mirtazapine 15 MG tablet Commonly known as:  REMERON Take 15 mg by mouth at bedtime.   oxyCODONE-acetaminophen 5-325 MG tablet Commonly known as:  ROXICET Take 1 tablet by mouth every 4 (four) hours as needed for severe pain.   polyethylene glycol powder powder Commonly known as:  GLYCOLAX/MIRALAX Take 17 g by mouth daily.   potassium chloride SA 20 MEQ tablet Commonly known as:  KLOR-CON M20 Take 1 tablet (20 mEq total) by mouth daily.   sennosides-docusate sodium 8.6-50 MG tablet Commonly known as:  SENOKOT-S Take 2 tablets by mouth 2 (two) times daily.       Review of Systems  Constitutional: Negative for activity change, chills, diaphoresis and fever.  HENT: Negative for congestion, sneezing, sore throat,  trouble swallowing and voice change.   Respiratory: Negative for apnea, cough, choking, chest tightness, shortness of breath and wheezing.   Cardiovascular: Negative for chest pain, palpitations and leg swelling.  Gastrointestinal: Negative for abdominal distention, abdominal pain, constipation, diarrhea and nausea.  Genitourinary: Negative for difficulty urinating, dysuria, frequency and urgency.  Musculoskeletal: Positive for arthralgias (typical arthritis), gait problem and myalgias. Negative for back pain.  Skin: Negative for color change, pallor, rash and wound.  Neurological: Negative for dizziness, tremors, syncope, speech difficulty, weakness, numbness and headaches.  Psychiatric/Behavioral: Positive for dysphoric mood. Negative for agitation and behavioral problems.  All other systems reviewed and are negative.   Immunization History  Administered Date(s) Administered  . PPD Test 01/09/2017   Pertinent  Health Maintenance Due  Topic Date Due  . FOOT EXAM  04/29/1944  . OPHTHALMOLOGY EXAM  04/29/1944  . DEXA SCAN  04/29/2001  . PNA vac Low Risk Adult (1 of 2 - PCV13) 04/29/2001  . HEMOGLOBIN A1C  03/17/2016  . INFLUENZA VACCINE  07/18/2017 (Originally 04/18/2017)   No flowsheet data found. Functional Status Survey:    Vitals:   06/11/17 1018  BP: (!) 104/51  Pulse: 68  Resp: 18  Temp: 98.8 F (37.1 C)  SpO2: 99%  Weight: 125 lb (56.7 kg)  Height: 5\' 2"  (1.575 m)   Body mass index is 22.86 kg/m. Physical Exam  Constitutional: She is oriented to person, place, and time. Vital signs are normal. She appears well-developed and well-nourished. She is active and cooperative. She does not appear ill. No distress.  HENT:  Head: Normocephalic and atraumatic.  Mouth/Throat: Uvula is midline, oropharynx is clear and moist and mucous membranes are normal. Mucous membranes are not pale, not dry and not cyanotic.  Eyes: Pupils are equal, round, and reactive to light.  Conjunctivae, EOM and lids are normal.  Neck: Trachea normal, normal range of motion and full passive range of motion without pain. Neck supple. No JVD present. No tracheal deviation, no edema and no erythema present. No thyromegaly present.  Cardiovascular: Normal rate, intact distal pulses and normal pulses.  An irregular rhythm present. Exam reveals no gallop, no distant heart sounds and no friction rub.   No murmur heard. Pulses:      Dorsalis pedis pulses are 2+ on the right side, and 2+ on the left side.  Pulmonary/Chest: Effort normal and breath sounds normal. No accessory muscle usage. No respiratory distress. She has no wheezes. She has no rales. She exhibits no tenderness.  Abdominal: Normal appearance and bowel sounds are normal. She exhibits no distension and no ascites. There is no tenderness.  Musculoskeletal: Normal range of motion. She exhibits no edema or tenderness.       Thoracic back: She exhibits pain (chronic).  Expected osteoarthritis, stiffness  Neurological: She is alert and oriented to person, place, and time. She has normal strength.  Skin: Skin is warm, dry and intact. No rash noted. She is not diaphoretic. No cyanosis or erythema. No pallor. Nails show no clubbing.  Psychiatric: Her speech is normal. Judgment and thought content normal. She is agitated (irritable a lot of times.) and withdrawn. Cognition and memory are normal. She exhibits a depressed mood. She is inattentive.  Nursing note and vitals reviewed.   Labs reviewed:  Recent Labs  01/07/17 1421 01/08/17 0622 05/15/17 0525  NA 138 136 138  K 3.9 4.0 3.9  CL 98* 100* 102  CO2 31 30 28   GLUCOSE 232* 214* 221*  BUN 15 13 14   CREATININE 0.83 0.59 0.49  CALCIUM 10.2 9.6 9.7    Recent Labs  10/27/16 2328 10/30/16 1923 01/07/17 1421  AST 24 31 22   ALT 14 15 15   ALKPHOS 87 74 102  BILITOT 0.5 0.8 0.5  PROT 7.8 7.0 8.3*  ALBUMIN 3.8 3.4* 3.9    Recent Labs  10/30/16 1923 01/07/17 1421  01/08/17 0622  WBC 6.8 6.5 5.8  NEUTROABS 3.7 3.7  --   HGB 12.1 13.0 11.5*  HCT 34.9* 38.1 33.8*  MCV 83.7 82.0 82.6  PLT 318 393 356   No results found for: TSH Lab Results  Component Value Date   HGBA1C 8.9 (H) 09/17/2015   No results found for: CHOL, HDL, LDLCALC, LDLDIRECT, TRIG, CHOLHDL  Significant Diagnostic Results in last 30 days:  No results found.  Assessment/Plan 1. Lumbar stenosis with neurogenic claudication  Stable  See OA  Aspercreme 4% Lidocaine patch- remove after 12 hours  2. Muscle weakness (generalized)  Stable  Mobilize with wheelchair  3. Hx of falling  Mobilize with wheelchair  Safety precautions  4. Adjustment disorder with mixed anxiety and depressed mood  Stable  Continue Mirtazapine 15 mg po Q HS  Increase Prozac 40 mg po Q Day (with emphasis to the pt that will help the leg pain)  5. Type 2 diabetes mellitus with diabetic neuropathy, without long-term current use of insulin (HCC)  Stable  Continue Gabapentin 400 mg po TID  6. Essential (primary) hypertension  Stable  Continue Amlodipine 10 mg po Q Day  Continue Lisinopril 10 mg po Q Day  7. Osteoarthritis, unspecified osteoarthritis type, unspecified site  Stable  Percocet 5/325 mg po Q 4 hours prn  8. Hypokalemia  Stable  Continue Potassium Chloride 20 meq po Q day  9. Personal history of malignant neoplasm of bronchus and lung  Stable   10. Constipation, unspecified constipation type  Stable  Continue Miralax 17 grams po Q Day  Continue Senna S 2 tablets po BID  11. Deep tissue injury  Allevyn patch to the left heel. Change Q 5 days  Elevate heels off the bed at all times  Skin prep to heels BID  Encourage use of Prevelon boots  Continue Glucerna supplements BID  GeoMat to the bed  Family/ staff Communication:   Total Time:  Documentation:  Face to Face:  Family/Phone:   Labs/tests ordered:    Medication list reviewed and  assessed for continued appropriateness. Monthly medication orders reviewed and signed.  Vikki Ports, NP-C Geriatrics [facility] [phone], [place] [address] Cell Phone (Mon-Fri 8am-5pm):  [phone] On Call:  [phone] & follow prompts after 5pm & weekends Office Phone:  [phone] Office Fax:  [phone]

## 2017-06-12 DIAGNOSIS — T148XXA Other injury of unspecified body region, initial encounter: Secondary | ICD-10-CM | POA: Insufficient documentation

## 2017-06-18 ENCOUNTER — Encounter
Admission: RE | Admit: 2017-06-18 | Discharge: 2017-06-18 | Disposition: A | Discharge status: Home / Self Care | Payer: Medicare Other | Source: Ambulatory Visit | Attending: Internal Medicine | Admitting: Internal Medicine

## 2017-07-02 ENCOUNTER — Non-Acute Institutional Stay (SKILLED_NURSING_FACILITY): Payer: Medicare Other | Admitting: Gerontology

## 2017-07-02 DIAGNOSIS — G8929 Other chronic pain: Secondary | ICD-10-CM

## 2017-07-02 DIAGNOSIS — R059 Cough, unspecified: Secondary | ICD-10-CM

## 2017-07-02 DIAGNOSIS — R51 Headache: Secondary | ICD-10-CM

## 2017-07-02 DIAGNOSIS — R05 Cough: Secondary | ICD-10-CM | POA: Diagnosis not present

## 2017-07-03 ENCOUNTER — Encounter: Payer: Self-pay | Admitting: Gerontology

## 2017-07-03 NOTE — Progress Notes (Addendum)
Location:   [facility] Room Number: 303A Place of Service:  SNF (31) Provider:  Shannon Leach, NP-C  Bronstein, David, MD  Patient Care Team: Bronstein, David, MD as PCP - General (Family Medicine)  Extended Emergency Contact Information Primary Emergency Contact: Faucette,Alvin Address: [address]          [address]          [place], [place] [address] [place] Home Phone: [phone] Relation: Friend Secondary Emergency Contact: Williamson,Lorna  [place] Home Phone: [phone] Relation: Friend  Code Status:  DNR Goals of care: Advanced Directive information Advanced Directives 07/02/2017  Does Patient Have a Medical Advance Directive? Yes  Type of Advance Directive Out of facility DNR (pink MOST or yellow form)  Does patient want to make changes to medical advance directive? No - Patient declined  Copy of Healthcare Power of Attorney in Chart? -  Would patient like information on creating a medical advance directive? -     Chief Complaint  Patient presents with  . Acute Visit    Headache, cough    HPI:  Pt is a 83 y.o. female seen today for an acute visit for complaint of intractable headaches and occasional cough.  Patient describes the headache as starting in her neck and working his way up the back of her head and then wraps around her head.  Patient describes the headache as tight feeling.  She reports the Tylenol does not help.  Patient reports she gets these headaches basically daily.  She says nothing helps.  After discussion, patient is willing to try Depakote as a method of headache prophylaxis.  Depakote will also work as an effective mood stabilizer, as patient has been more irritable as of late.  Patient also complains of intermittent cough.  She denies sputum production.  Denies chest pain, or shortness of breath associated with cough.  Patient has been afebrile.  Lungs clear.  No signs or symptoms of  pneumonia or URI.  No sinus pain or pressure.  Patient denies allergy symptoms/postnasal drip.  Patient denies n/v/d/f/c/cp/sob/abd pain/dizziness.  Patient also denies vision changes associated with the headache.  Vital signs stable.  No other complaints.   Past Medical History:  Diagnosis Date  . Arthritis   . Back pain   . Cancer ([facility])    Lung  . Diabetes mellitus without complication ([facility])   . Diabetes mellitus, type 2 ([facility])   . Hypertension   . Lumbar stenosis   . Osteoarthritis    Past Surgical History:  Procedure Laterality Date  . ABDOMINAL HYSTERECTOMY    . FINE NEEDLE ASPIRATION Left 09/06/2016   Procedure: FINE NEEDLE ASPIRATION; WITH IMAGING GUIDANCE; Surgeon: Allen Cole Burks, MD; Location: MAIN OR [facility]; Service: Pulmonary  . LAMINOTOMY  10/08/2015  . LUMBAR LAMINECTOMY WITH COFLEX 2 LEVEL Bilateral 09/23/2015   Procedure: Laminectomy and Foraminotomy L4-L5 bilateral with CoFlex L5-S1 - left;  Surgeon: Randy Kritzer, MD;  Location: [facility] NEURO ORS;  Service: Neurosurgery;  Laterality: Bilateral;  . WOUND EXPLORATION N/A 09/27/2015   Procedure: WOUND EXPLORATION;  Surgeon: Benjamin Jared Ditty, MD;  Location: [facility] NEURO ORS;  Service: Neurosurgery;  Laterality: N/A;  WOUND EXPLORATION    Allergies  Allergen Reactions  . Tramadol Nausea Only and Nausea And Vomiting    Allergies as of 07/02/2017      Reactions   Tramadol Nausea Only, Nausea And Vomiting      Medication List         Accurate as of 07/02/17 11:59 PM. Always use your most recent med list.          acetaminophen 325 MG tablet Commonly known as:  TYLENOL Take 650 mg by mouth every 6 (six) hours as needed. Do not exceed 3000 mg in 24 hour period   alum & mag hydroxide-simeth 400-400-40 MG/5ML suspension Commonly known as:  MAALOX PLUS Take 30 mLs by mouth every 4 (four) hours as needed for indigestion.   amLODipine 10 MG tablet Commonly known as:  NORVASC Take 10 mg by mouth daily.   ASPERCREME  LIDOCAINE 4 % Ptch Generic drug:  Lidocaine Apply 1 patch topically daily. Apply patch to area of pain on the back. Remove after 12 hours.   benzonatate 100 MG capsule Commonly known as:  TESSALON Take 100 mg by mouth 3 (three) times daily as needed for cough.   carvedilol 25 MG tablet Commonly known as:  COREG Take 1 tablet (25 mg total) by mouth 2 (two) times daily with a meal.   FLUoxetine 40 MG capsule Commonly known as:  PROZAC Take 40 mg by mouth daily.   gabapentin 400 MG capsule Commonly known as:  NEURONTIN Take 400 mg by mouth every 8 (eight) hours.   GLUCERNA Liqd Take 237 mLs by mouth 2 (two) times daily.   guaiFENesin 100 MG/5ML liquid Commonly known as:  ROBITUSSIN Take 200 mg by mouth every 4 (four) hours as needed for cough. Notify the provider if cough worsens/ continues longer than 3 days   lisinopril 10 MG tablet Commonly known as:  PRINIVIL,ZESTRIL Take 10 mg by mouth daily.   magnesium hydroxide 400 MG/5ML suspension Commonly known as:  MILK OF MAGNESIA Take 30 mLs by mouth every 4 (four) hours as needed for mild constipation. If Constipation/ no BM for 2 days   Menthol 2.7 MG Lozg Use as directed 1 lozenge in the mouth or throat every 2 (two) hours as needed.   mirtazapine 15 MG tablet Commonly known as:  REMERON Take 15 mg by mouth at bedtime.   oxyCODONE-acetaminophen 5-325 MG tablet Commonly known as:  ROXICET Take 1 tablet by mouth every 4 (four) hours as needed for severe pain.   polyethylene glycol powder powder Commonly known as:  GLYCOLAX/MIRALAX Take 17 g by mouth daily.   potassium chloride SA 20 MEQ tablet Commonly known as:  KLOR-CON M20 Take 1 tablet (20 mEq total) by mouth daily.   sennosides-docusate sodium 8.6-50 MG tablet Commonly known as:  SENOKOT-S Take 2 tablets by mouth 2 (two) times daily.   white petrolatum ointment Apply topically to dry skin/heels as needed. May keep at bedside       Review of Systems    Constitutional: Negative for activity change, appetite change, chills, diaphoresis and fever.  HENT: Negative for congestion, ear pain, hearing loss, rhinorrhea, sinus pain, sinus pressure, sneezing, sore throat, tinnitus, trouble swallowing and voice change.   Eyes: Negative for photophobia and visual disturbance.  Respiratory: Negative for apnea, cough, choking, chest tightness, shortness of breath and wheezing.   Cardiovascular: Negative for chest pain, palpitations and leg swelling.  Gastrointestinal: Negative for abdominal distention, abdominal pain, constipation, diarrhea and nausea.  Genitourinary: Negative for difficulty urinating, dysuria, frequency and urgency.  Musculoskeletal: Positive for arthralgias (typical arthritis). Negative for back pain, gait problem and myalgias.  Skin: Negative for color change, pallor, rash and wound.  Neurological: Positive for weakness and headaches. Negative for dizziness, tremors, seizures, syncope, facial asymmetry, speech difficulty, light-headedness   and numbness.  Psychiatric/Behavioral: Negative for agitation and behavioral problems.  All other systems reviewed and are negative.   Immunization History  Administered Date(s) Administered  . PPD Test 01/09/2017   Pertinent  Health Maintenance Due  Topic Date Due  . FOOT EXAM  04/29/1944  . OPHTHALMOLOGY EXAM  04/29/1944  . DEXA SCAN  04/29/2001  . PNA vac Low Risk Adult (1 of 2 - PCV13) 04/29/2001  . HEMOGLOBIN A1C  03/17/2016  . INFLUENZA VACCINE  07/03/2018 (Originally 04/18/2017)   No flowsheet data found. Functional Status Survey:    Vitals:   07/02/17 1210  BP: (!) 119/49  Pulse: 63  Resp: 18  Temp: 97.8 F (36.6 C)  SpO2: 98%  Weight: 126 lb (57.2 kg)  Height: 5' 2" (1.575 m)   Body mass index is 23.05 kg/m. Physical Exam  Constitutional: She is oriented to person, place, and time. Vital signs are normal. She appears well-developed and well-nourished. She is active and  cooperative. She does not appear ill. No distress.  HENT:  Head: Normocephalic and atraumatic.  Mouth/Throat: Uvula is midline, oropharynx is clear and moist and mucous membranes are normal. Mucous membranes are not pale, not dry and not cyanotic.  Eyes: Pupils are equal, round, and reactive to light. Conjunctivae, EOM and lids are normal.  Neck: Trachea normal, normal range of motion and full passive range of motion without pain. Neck supple. No JVD present. No tracheal deviation, no edema and no erythema present. No thyromegaly present.  Cardiovascular: Normal rate, regular rhythm, normal heart sounds, intact distal pulses and normal pulses.  Exam reveals no gallop, no distant heart sounds and no friction rub.   No murmur heard. Pulses:      Dorsalis pedis pulses are 2+ on the right side, and 2+ on the left side.  No edema  Pulmonary/Chest: Effort normal and breath sounds normal. No accessory muscle usage. No respiratory distress. She has no decreased breath sounds. She has no wheezes. She has no rhonchi. She has no rales. She exhibits no tenderness.  Abdominal: Normal appearance and bowel sounds are normal. She exhibits no distension and no ascites. There is no tenderness.  Musculoskeletal: Normal range of motion. She exhibits no edema or tenderness.  Expected osteoarthritis, stiffness  Neurological: She is alert and oriented to person, place, and time. She has normal strength. Coordination and gait abnormal.  Skin: Skin is warm, dry and intact. She is not diaphoretic. No cyanosis. No pallor. Nails show no clubbing.  Psychiatric: Her speech is normal. Judgment normal. Her mood appears anxious. Her affect is blunt and labile. She is slowed. Thought content is delusional. Cognition and memory are impaired. She exhibits a depressed mood. She exhibits abnormal recent memory.  Nursing note and vitals reviewed.   Labs reviewed:  Recent Labs  01/07/17 1421 01/08/17 0622 05/15/17 0525  NA 138  136 138  K 3.9 4.0 3.9  CL 98* 100* 102  CO2 31 30 28  GLUCOSE 232* 214* 221*  BUN 15 13 14  CREATININE 0.83 0.59 0.49  CALCIUM 10.2 9.6 9.7    Recent Labs  10/27/16 2328 10/30/16 1923 01/07/17 1421  AST 24 31 22  ALT 14 15 15  ALKPHOS 87 74 102  BILITOT 0.5 0.8 0.5  PROT 7.8 7.0 8.3*  ALBUMIN 3.8 3.4* 3.9    Recent Labs  10/30/16 1923 01/07/17 1421 01/08/17 0622  WBC 6.8 6.5 5.8  NEUTROABS 3.7 3.7  --   HGB 12.1 13.0 11.5*    HCT 34.9* 38.1 33.8*  MCV 83.7 82.0 82.6  PLT 318 393 356   No results found for: TSH Lab Results  Component Value Date   HGBA1C 8.9 (H) 09/17/2015   No results found for: CHOL, HDL, LDLCALC, LDLDIRECT, TRIG, CHOLHDL  Significant Diagnostic Results in last 30 days:  No results found.  Assessment/Plan 1.  Chronic intractable headache, unspecified headache type  Depakote DR 125 mg - 1 capsule p.o. nightly times 3 days, then  Depakote DR 125 mg-1 capsule p.o. twice daily for headache prophylaxis  2.  Cough  Continue to use as needed guaifenesin for congestion  Tessalon Perles 100 mg capsule p.o. 3 times daily as needed  Menthol cough drops 1 lozenge every 2 hours as needed  Family/ staff Communication:   Total Time:  Documentation:  Face to Face:  Family/Phone:   Labs/tests ordered: CBC, met C, TSH, B12, D, magnesium, Depakote level  Medication list reviewed and assessed for continued appropriateness.  Shannon H. Leach, NP-C Geriatrics [facility] [address] [place], [place] [address] Cell Phone (Mon-Fri 8am-5pm):  [phone] On Call:  [phone] & follow prompts after 5pm & weekends Office Phone:  [phone] Office Fax:  [phone]   

## 2017-07-12 ENCOUNTER — Non-Acute Institutional Stay (SKILLED_NURSING_FACILITY): Payer: Medicare Other | Admitting: Gerontology

## 2017-07-12 ENCOUNTER — Encounter: Payer: Self-pay | Admitting: Gerontology

## 2017-07-12 DIAGNOSIS — Z9181 History of falling: Secondary | ICD-10-CM | POA: Diagnosis not present

## 2017-07-12 DIAGNOSIS — E1140 Type 2 diabetes mellitus with diabetic neuropathy, unspecified: Secondary | ICD-10-CM | POA: Diagnosis not present

## 2017-07-12 DIAGNOSIS — K5900 Constipation, unspecified: Secondary | ICD-10-CM | POA: Diagnosis not present

## 2017-07-12 DIAGNOSIS — Z85118 Personal history of other malignant neoplasm of bronchus and lung: Secondary | ICD-10-CM | POA: Diagnosis not present

## 2017-07-12 DIAGNOSIS — F4323 Adjustment disorder with mixed anxiety and depressed mood: Secondary | ICD-10-CM

## 2017-07-12 DIAGNOSIS — R51 Headache: Secondary | ICD-10-CM | POA: Diagnosis not present

## 2017-07-12 DIAGNOSIS — G8929 Other chronic pain: Secondary | ICD-10-CM

## 2017-07-12 DIAGNOSIS — M1990 Unspecified osteoarthritis, unspecified site: Secondary | ICD-10-CM

## 2017-07-12 DIAGNOSIS — I10 Essential (primary) hypertension: Secondary | ICD-10-CM | POA: Diagnosis not present

## 2017-07-12 DIAGNOSIS — T148XXA Other injury of unspecified body region, initial encounter: Secondary | ICD-10-CM | POA: Diagnosis not present

## 2017-07-12 DIAGNOSIS — M48062 Spinal stenosis, lumbar region with neurogenic claudication: Secondary | ICD-10-CM

## 2017-07-12 DIAGNOSIS — M6281 Muscle weakness (generalized): Secondary | ICD-10-CM | POA: Diagnosis not present

## 2017-07-12 DIAGNOSIS — E876 Hypokalemia: Secondary | ICD-10-CM

## 2017-07-18 DIAGNOSIS — R51 Headache: Secondary | ICD-10-CM

## 2017-07-18 DIAGNOSIS — R519 Headache, unspecified: Secondary | ICD-10-CM | POA: Insufficient documentation

## 2017-07-19 ENCOUNTER — Encounter
Admission: RE | Admit: 2017-07-19 | Discharge: 2017-07-19 | Disposition: A | Discharge status: Home / Self Care | Payer: Medicare Other | Source: Ambulatory Visit | Attending: Internal Medicine | Admitting: Internal Medicine

## 2017-07-19 NOTE — Progress Notes (Signed)
Location:   [place] Room Number: [place] of Service:  SNF [phone]) Provider:  Toni Arthurs, NP-C  Juluis Pitch, MD  Patient Care Team: Juluis Pitch, MD as PCP - General [facility] Medicine)  Extended Emergency Contact Information Primary Emergency Contact: [facility] Address: [address]          [place]          [place], [place] [address] Johnnette Litter of [place] Phone: [phone] Relation: Friend Secondary Emergency Contact: Francena Hanly [place] of [place] Phone: [phone] Relation: Friend  Code Status:  DNR Goals of care: Advanced Directive information Advanced Directives 07/12/2017  Does Patient Have a Medical Advance Directive? Yes  Type of Advance Directive Out of facility DNR (pink MOST or yellow form)  Does patient want to make changes to medical advance directive? No - Patient declined  Copy of [place] in Chart? -  Would patient like information on creating a medical advance directive? -     Chief Complaint  Patient presents with  . Medical Management of Chronic Issues    Routine Visit    HPI:  Pt is a 83 y.o. female seen today for medical management of chronic diseases.  Patient has been stable over the past month.  Her appetite is diminished.  On mirtazapine.  Continue to encourage p.o. food and fluid intake.  Continue Glucerna twice daily.  Patient reports she is does not like the food.  No falls this month.  Patient does not socialize with others.  She typically stays in her room to herself.  Patient is being followed by palliative care.  Patient was started on Prozac for mood stabilization, depression and irritability as well as it will also help with the diabetic neuropathy.  This has improved symptoms.  This patient now complains of intractable headache.  Was started on Depakote 2 weeks ago for this.  Patient reports some improvement but would like more.  Patient agreeable to  increase in dose of Depakote.  Depakote was chosen in order to also help with mood stabilization.  Patient continues to refuse care at times and also continues to be irritable with staff.  Patient seems to be fixated on her cancer and her mortality more likely.  Otherwise, vital signs stable.  No other complaints.  Past Medical History:  Diagnosis Date  . Arthritis   . Back pain   . Cancer ([place])    Lung  . Diabetes mellitus without complication ([place])   . Diabetes mellitus, type 2 ([place])   . Hypertension   . Lumbar stenosis   . Osteoarthritis    Past Surgical History:  Procedure Laterality Date  . ABDOMINAL HYSTERECTOMY    . FINE NEEDLE ASPIRATION Left 09/06/2016   Procedure: FINE NEEDLE ASPIRATION; WITH IMAGING GUIDANCE; Surgeon: Marchia Bond, MD; Location: MAIN OR [facility]; Service: Pulmonary  . LAMINOTOMY  10/08/2015  . LUMBAR LAMINECTOMY WITH COFLEX 2 LEVEL Bilateral 09/23/2015   Procedure: Laminectomy and Foraminotomy L4-L5 bilateral with CoFlex L5-S1 - left;  Surgeon: Karie Chimera, MD;  Location: [place] NEURO ORS;  Service: Neurosurgery;  Laterality: Bilateral;  . WOUND EXPLORATION N/A 09/27/2015   Procedure: WOUND EXPLORATION;  Surgeon: Kevan Ny Ditty, MD;  Location: [place] NEURO ORS;  Service: Neurosurgery;  Laterality: N/A;  WOUND EXPLORATION    Allergies  Allergen Reactions  . Tramadol Nausea Only and Nausea And Vomiting    Allergies as of 07/12/2017      Reactions  Tramadol Nausea Only, Nausea And Vomiting      Medication List       Accurate as of 07/12/17 11:59 PM. Always use your most recent med list.          acetaminophen 325 MG tablet Commonly known as:  TYLENOL Take 650 mg by mouth every 6 (six) hours as needed. Do not exceed 3000 mg in 24 hour period   alum & mag hydroxide-simeth 400-400-40 MG/5ML suspension Commonly known as:  MAALOX PLUS Take 30 mLs by mouth every 4 (four) hours as needed for indigestion.   amLODipine 10 MG tablet Commonly known as:   NORVASC Take 10 mg by mouth daily.   ASPERCREME LIDOCAINE 4 % Ptch Generic drug:  Lidocaine Apply 1 patch topically daily. Apply patch to area of pain on the back. Remove after 12 hours.   benzonatate 100 MG capsule Commonly known as:  TESSALON Take 100 mg by mouth 3 (three) times daily as needed for cough.   carvedilol 25 MG tablet Commonly known as:  COREG Take 1 tablet (25 mg total) by mouth 2 (two) times daily with a meal.   divalproex 125 MG DR tablet Commonly known as:  DEPAKOTE Take 125 mg by mouth 2 (two) times daily.   FLUoxetine 40 MG capsule Commonly known as:  PROZAC Take 40 mg by mouth daily.   gabapentin 400 MG capsule Commonly known as:  NEURONTIN Take 400 mg by mouth every 8 (eight) hours.   GLUCERNA Liqd Take 237 mLs by mouth 2 (two) times daily.   guaiFENesin 100 MG/5ML liquid Commonly known as:  ROBITUSSIN Take 200 mg by mouth every 4 (four) hours as needed for cough. Notify the provider if cough worsens/ continues longer than 3 days   lisinopril 10 MG tablet Commonly known as:  PRINIVIL,ZESTRIL Take 10 mg by mouth daily.   magnesium hydroxide 400 MG/5ML suspension Commonly known as:  MILK OF MAGNESIA Take 30 mLs by mouth every 4 (four) hours as needed for mild constipation. If Constipation/ no BM for 2 days   Menthol 2.7 MG Lozg Use as directed 1 lozenge in the mouth or throat every 2 (two) hours as needed.   mirtazapine 15 MG tablet Commonly known as:  REMERON Take 15 mg by mouth at bedtime.   oxyCODONE-acetaminophen 5-325 MG tablet Commonly known as:  ROXICET Take 1 tablet by mouth every 4 (four) hours as needed for severe pain.   polyethylene glycol powder powder Commonly known as:  GLYCOLAX/MIRALAX Take 17 g by mouth daily.   potassium chloride SA 20 MEQ tablet Commonly known as:  KLOR-CON M20 Take 1 tablet (20 mEq total) by mouth daily.   sennosides-docusate sodium 8.6-50 MG tablet Commonly known as:  SENOKOT-S Take 2 tablets by  mouth 2 (two) times daily.   white petrolatum ointment Apply topically to dry skin/heels as needed. May keep at bedside       Review of Systems  Constitutional: Negative for activity change, appetite change, chills, diaphoresis and fever.  HENT: Negative for congestion, sneezing, sore throat, trouble swallowing and voice change.   Eyes: Negative for pain, redness and visual disturbance.  Respiratory: Negative for apnea, cough, choking, chest tightness, shortness of breath and wheezing.   Cardiovascular: Negative for chest pain, palpitations and leg swelling.  Gastrointestinal: Negative for abdominal distention, abdominal pain, constipation, diarrhea and nausea.  Genitourinary: Negative for difficulty urinating, dysuria, frequency and urgency.  Musculoskeletal: Positive for arthralgias (typical arthritis), gait problem and myalgias. Negative for  back pain.  Skin: Negative for color change, pallor, rash and wound.  Neurological: Negative for dizziness, tremors, syncope, speech difficulty, weakness, numbness and headaches.  Psychiatric/Behavioral: Positive for dysphoric mood. Negative for agitation and behavioral problems.  All other systems reviewed and are negative.   Immunization History  Administered Date(s) Administered  . PPD Test 01/09/2017   Pertinent  Health Maintenance Due  Topic Date Due  . FOOT EXAM  04/29/1944  . OPHTHALMOLOGY EXAM  04/29/1944  . DEXA SCAN  04/29/2001  . PNA vac Low Risk Adult (1 of 2 - PCV13) 04/29/2001  . HEMOGLOBIN A1C  03/17/2016  . INFLUENZA VACCINE  June 23, 2018 (Originally 04/18/2017)   No flowsheet data found. Functional Status Survey:    Vitals:   07/12/17 1445  BP: (!) 118/51  Pulse: 63  Resp: 18  Temp: 97.6 F (36.4 C)  SpO2: 96%  Weight: 126 lb (57.2 kg)  Height: 5' 2" (1.575 m)   Body mass index is 23.05 kg/m. Physical Exam  Constitutional: She is oriented to person, place, and time. Vital signs are normal. She appears  well-developed and well-nourished. She is active and cooperative. She does not appear ill. No distress.  HENT:  Head: Normocephalic and atraumatic.  Mouth/Throat: Uvula is midline, oropharynx is clear and moist and mucous membranes are normal. Mucous membranes are not pale, not dry and not cyanotic.  Eyes: Pupils are equal, round, and reactive to light. Conjunctivae, EOM and lids are normal.  Neck: Trachea normal, normal range of motion and full passive range of motion without pain. Neck supple. No JVD present. No tracheal deviation, no edema and no erythema present. No thyromegaly present.  Cardiovascular: Normal rate, normal heart sounds, intact distal pulses and normal pulses.  An irregular rhythm present. Exam reveals no gallop, no distant heart sounds and no friction rub.   No murmur heard. Pulses:      Dorsalis pedis pulses are 2+ on the right side, and 2+ on the left side.  No edema  Pulmonary/Chest: Effort normal and breath sounds normal. No accessory muscle usage. No respiratory distress. She has no decreased breath sounds. She has no wheezes. She has no rhonchi. She has no rales. She exhibits no tenderness.  Abdominal: Normal appearance and bowel sounds are normal. She exhibits no distension and no ascites. There is no tenderness.  Musculoskeletal: Normal range of motion. She exhibits no edema or tenderness.       Thoracic back: She exhibits pain (chronic).  Expected osteoarthritis, stiffness  Neurological: She is alert and oriented to person, place, and time. She has normal strength. Coordination and gait abnormal.  Skin: Skin is warm, dry and intact. No rash noted. She is not diaphoretic. No cyanosis or erythema. No pallor. Nails show no clubbing.  Psychiatric: Her speech is normal. Judgment normal. She is agitated (irritable a lot of times.) and withdrawn. Thought content is paranoid. Cognition and memory are impaired. She exhibits a depressed mood. She is inattentive.  Nursing note  and vitals reviewed.   Labs reviewed:  Recent Labs  01/07/17 1421 01/08/17 0622 05/15/17 0525  NA 138 136 138  K 3.9 4.0 3.9  CL 98* 100* 102  CO2 _0 GLUCOSE 232* 214* 221*  BUN _1 CREATININE 0.83 0.59 0.49  CALCIUM 10.2 9.6 9.7    Recent Labs  10/27/16 2328 10/30/16 1923 01/07/17 1421  AST _2 ALT _3 ALKPHOS 87 74 102  BILITOT 0.5  0.8 0.5  PROT 7.8 7.0 8.3*  ALBUMIN 3.8 3.4* 3.9    Recent Labs  10/30/16 1923 01/07/17 1421 01/08/17 0622  WBC 6.8 6.5 5.8  NEUTROABS 3.7 3.7  --   HGB 12.1 13.0 11.5*  HCT 34.9* 38.1 33.8*  MCV 83.7 82.0 82.6  PLT 318 393 356   No results found for: TSH Lab Results  Component Value Date   HGBA1C 8.9 (H) 09/17/2015   No results found for: CHOL, HDL, LDLCALC, LDLDIRECT, TRIG, CHOLHDL  Significant Diagnostic Results in last 30 days:  No results found.  Assessment/Plan 1. Lumbar stenosis with neurogenic claudication  Stable  See OA  Continue Aspercreme 4% Lidocaine patch- remove after 12 hours  2. Muscle weakness (generalized)  Stable  Mobilize with wheelchair  3. Hx of falling  Mobilize with wheelchair  Safety precautions  4. Adjustment disorder with mixed anxiety and depressed mood  Stable  Continue Mirtazapine 15 mg po Q HS  Increase Prozac 40 mg po Q Day (with emphasis to the pt that will help the leg pain)  5. Type 2 diabetes mellitus with diabetic neuropathy, without long-term current use of insulin (HCC)  Stable  Continue Gabapentin 400 mg po TID  6. Essential (primary) hypertension  Stable  Continue Amlodipine 10 mg po Q Day  Continue Lisinopril 10 mg po Q Day  7. Osteoarthritis, unspecified osteoarthritis type, unspecified site  Stable  Continue Percocet 5/325 mg po Q 4 hours prn  8. Hypokalemia  Stable  Continue Potassium Chloride 20 meq po Q day  9. Personal history of malignant neoplasm of bronchus and lung  Stable   10. Constipation,  unspecified constipation type  Stable  Continue Miralax 17 grams po Q Day  Continue Senna S 2 tablets po BID  11. Deep tissue injury  Resolved  Elevate heels off the bed at all times  Skin prep to heels BID  Encourage use of Prevelon boots  Continue Glucerna supplements BID  GeoMat to the bed  12. Chronic intractable headache, unspecified headache type  Increase Depakote DR 125 mg capsules-2 capsules p.o. twice daily  Continue all medications as listed above unless otherwise specified  Family/ staff Communication:   Total Time:  Documentation:  Face to Face:  Family/Phone:   Labs/tests ordered: CBC, met C, TSH, mag, B12, D, A1c  Medication list reviewed and assessed for continued appropriateness. Monthly medication orders reviewed and signed.  Vikki Ports, NP-C Geriatrics [facility] [phone], [place] [address] Cell Phone (Mon-Fri 8am-5pm):  [phone] On Call:  [phone] & follow prompts after 5pm & weekends Office Phone:  [phone] Office Fax:  [phone]

## 2017-07-24 ENCOUNTER — Other Ambulatory Visit
Admission: RE | Admit: 2017-07-24 | Discharge: 2017-07-24 | Disposition: A | Discharge status: Home / Self Care | Payer: Medicare Other | Source: Ambulatory Visit | Attending: Gerontology | Admitting: Gerontology

## 2017-07-24 DIAGNOSIS — E1140 Type 2 diabetes mellitus with diabetic neuropathy, unspecified: Secondary | ICD-10-CM | POA: Diagnosis present

## 2017-07-24 LAB — COMPREHENSIVE METABOLIC PANEL
ALBUMIN: 3.4 g/dL — AB (ref 3.5–5.0)
ALK PHOS: 96 U/L (ref 38–126)
ALT: 12 U/L — AB (ref 14–54)
AST: 17 U/L (ref 15–41)
Anion gap: 9 (ref 5–15)
BUN: 11 mg/dL (ref 6–20)
CALCIUM: 9.7 mg/dL (ref 8.9–10.3)
CHLORIDE: 99 mmol/L — AB (ref 101–111)
CO2: 30 mmol/L (ref 22–32)
CREATININE: 0.44 mg/dL (ref 0.44–1.00)
GFR calc non Af Amer: >60 mL/min (ref >60)
GLUCOSE: 168 mg/dL — AB (ref 65–99)
Potassium: 4 mmol/L (ref 3.5–5.1)
SODIUM: 138 mmol/L (ref 135–145)
Total Bilirubin: 0.7 mg/dL (ref 0.3–1.2)
Total Protein: 7.1 g/dL (ref 6.5–8.1)

## 2017-07-24 LAB — CBC WITH DIFFERENTIAL/PLATELET
BASOS ABS: 0 10*3/uL (ref 0–0.1)
BASOS PCT: 0 %
EOS ABS: 0.2 10*3/uL (ref 0–0.7)
Eosinophils Relative: 3 %
HEMATOCRIT: 39.7 % (ref 35.0–47.0)
HEMOGLOBIN: 13.5 g/dL (ref 12.0–16.0)
Lymphocytes Relative: 35 %
Lymphs Abs: 2.1 10*3/uL (ref 1.0–3.6)
MCH: 28.4 pg (ref 26.0–34.0)
MCHC: 34.1 g/dL (ref 32.0–36.0)
MCV: 83.3 fL (ref 80.0–100.0)
MONOS PCT: 14 %
Monocytes Absolute: 0.9 10*3/uL (ref 0.2–0.9)
NEUTROS ABS: 2.9 10*3/uL (ref 1.4–6.5)
NEUTROS PCT: 48 %
Platelets: 300 10*3/uL (ref 150–440)
RBC: 4.76 MIL/uL (ref 3.80–5.20)
RDW: 15.5 % — ABNORMAL HIGH (ref 11.5–14.5)
WBC: 6.1 10*3/uL (ref 3.6–11.0)

## 2017-07-24 LAB — VITAMIN B12: VITAMIN B 12: 701 pg/mL (ref 180–914)

## 2017-07-24 LAB — MAGNESIUM: MAGNESIUM: 1.9 mg/dL (ref 1.7–2.4)

## 2017-07-24 LAB — HEMOGLOBIN A1C
Hgb A1c MFr Bld: 8.9 % — ABNORMAL HIGH (ref 4.8–5.6)
Mean Plasma Glucose: 208.73 mg/dL

## 2017-07-24 LAB — TSH: TSH: 1.642 u[IU]/mL (ref 0.350–4.500)

## 2017-07-25 LAB — VALPROIC ACID LEVEL, FREE: VALPROIC ACID FREE: 6 ug/mL (ref 6.0–22.0)

## 2017-07-25 LAB — VITAMIN D 25 HYDROXY (VIT D DEFICIENCY, FRACTURES): VIT D 25 HYDROXY: 27.9 ng/mL — AB (ref 30.0–100.0)

## 2017-08-17 ENCOUNTER — Non-Acute Institutional Stay (SKILLED_NURSING_FACILITY): Payer: Medicare Other | Admitting: Gerontology

## 2017-08-17 ENCOUNTER — Encounter: Payer: Self-pay | Admitting: Gerontology

## 2017-08-17 DIAGNOSIS — E1140 Type 2 diabetes mellitus with diabetic neuropathy, unspecified: Secondary | ICD-10-CM | POA: Diagnosis not present

## 2017-08-17 DIAGNOSIS — I10 Essential (primary) hypertension: Secondary | ICD-10-CM | POA: Diagnosis not present

## 2017-08-17 DIAGNOSIS — C3492 Malignant neoplasm of unspecified part of left bronchus or lung: Secondary | ICD-10-CM

## 2017-08-18 ENCOUNTER — Encounter
Admission: RE | Admit: 2017-08-18 | Discharge: 2017-08-18 | Disposition: A | Discharge status: Home / Self Care | Payer: Medicare Other | Source: Ambulatory Visit | Attending: Internal Medicine | Admitting: Internal Medicine

## 2017-08-21 ENCOUNTER — Other Ambulatory Visit
Admission: RE | Admit: 2017-08-21 | Discharge: 2017-08-21 | Disposition: A | Discharge status: Home / Self Care | Payer: Medicare Other | Source: Ambulatory Visit | Attending: Gerontology | Admitting: Gerontology

## 2017-08-21 DIAGNOSIS — E1140 Type 2 diabetes mellitus with diabetic neuropathy, unspecified: Secondary | ICD-10-CM | POA: Diagnosis present

## 2017-08-21 LAB — COMPREHENSIVE METABOLIC PANEL
ALBUMIN: 3 g/dL — AB (ref 3.5–5.0)
ALK PHOS: 81 U/L (ref 38–126)
ALT: 9 U/L — ABNORMAL LOW (ref 14–54)
ANION GAP: 8 (ref 5–15)
AST: 16 U/L (ref 15–41)
BUN: 12 mg/dL (ref 6–20)
CO2: 27 mmol/L (ref 22–32)
Calcium: 9.2 mg/dL (ref 8.9–10.3)
Chloride: 104 mmol/L (ref 101–111)
Creatinine, Ser: 0.4 mg/dL — ABNORMAL LOW (ref 0.44–1.00)
GFR calc non Af Amer: >60 mL/min (ref >60)
GLUCOSE: 151 mg/dL — AB (ref 65–99)
POTASSIUM: 4.1 mmol/L (ref 3.5–5.1)
SODIUM: 139 mmol/L (ref 135–145)
Total Bilirubin: 0.3 mg/dL (ref 0.3–1.2)
Total Protein: 6.5 g/dL (ref 6.5–8.1)

## 2017-08-21 LAB — TSH: TSH: 1.07 u[IU]/mL (ref 0.350–4.500)

## 2017-08-21 LAB — CBC WITH DIFFERENTIAL/PLATELET
BASOS ABS: 0 10*3/uL (ref 0–0.1)
BASOS PCT: 1 %
EOS ABS: 0.2 10*3/uL (ref 0–0.7)
EOS PCT: 3 %
HCT: 37.8 % (ref 35.0–47.0)
Hemoglobin: 13.1 g/dL (ref 12.0–16.0)
Lymphocytes Relative: 37 %
Lymphs Abs: 2.1 10*3/uL (ref 1.0–3.6)
MCH: 29.5 pg (ref 26.0–34.0)
MCHC: 34.7 g/dL (ref 32.0–36.0)
MCV: 85.1 fL (ref 80.0–100.0)
MONO ABS: 0.9 10*3/uL (ref 0.2–0.9)
MONOS PCT: 16 %
NEUTROS ABS: 2.5 10*3/uL (ref 1.4–6.5)
Neutrophils Relative %: 43 %
PLATELETS: 239 10*3/uL (ref 150–440)
RBC: 4.44 MIL/uL (ref 3.80–5.20)
RDW: 16.1 % — ABNORMAL HIGH (ref 11.5–14.5)
WBC: 5.6 10*3/uL (ref 3.6–11.0)

## 2017-08-21 LAB — VITAMIN B12: VITAMIN B 12: 989 pg/mL — AB (ref 180–914)

## 2017-08-21 LAB — MAGNESIUM: MAGNESIUM: 1.7 mg/dL (ref 1.7–2.4)

## 2017-08-22 LAB — VITAMIN D 25 HYDROXY (VIT D DEFICIENCY, FRACTURES): Vit D, 25-Hydroxy: 33.2 ng/mL (ref 30.0–100.0)

## 2017-08-22 LAB — VALPROIC ACID LEVEL, FREE: Valproic Acid, Free: 7.8 ug/mL (ref 6.0–22.0)

## 2017-09-03 ENCOUNTER — Other Ambulatory Visit: Payer: Self-pay

## 2017-09-03 MED ORDER — OXYCODONE-ACETAMINOPHEN 5-325 MG PO TABS: 1.0000 | ORAL_TABLET | ORAL | 0 refills | Status: DC | PRN

## 2017-09-03 NOTE — Telephone Encounter (Signed)
Rx sent to [facility] phone : [phone] , fax : [phone]  

## 2017-09-14 ENCOUNTER — Encounter: Payer: Self-pay | Admitting: Gerontology

## 2017-09-14 ENCOUNTER — Non-Acute Institutional Stay (SKILLED_NURSING_FACILITY): Payer: Medicare Other | Admitting: Gerontology

## 2017-09-14 DIAGNOSIS — F321 Major depressive disorder, single episode, moderate: Secondary | ICD-10-CM | POA: Diagnosis not present

## 2017-09-14 DIAGNOSIS — M6281 Muscle weakness (generalized): Secondary | ICD-10-CM

## 2017-09-14 DIAGNOSIS — M1990 Unspecified osteoarthritis, unspecified site: Secondary | ICD-10-CM

## 2017-09-18 ENCOUNTER — Encounter
Admission: RE | Admit: 2017-09-18 | Discharge: 2017-09-18 | Disposition: A | Discharge status: Home / Self Care | Payer: Medicare Other | Source: Ambulatory Visit | Attending: Internal Medicine | Admitting: Internal Medicine

## 2017-09-26 ENCOUNTER — Non-Acute Institutional Stay (SKILLED_NURSING_FACILITY): Payer: Medicare Other | Admitting: Gerontology

## 2017-09-26 DIAGNOSIS — L723 Sebaceous cyst: Secondary | ICD-10-CM

## 2017-09-28 ENCOUNTER — Encounter: Payer: Self-pay | Admitting: Gerontology

## 2017-09-28 NOTE — Progress Notes (Signed)
A user error has taken place: encounter opened in error, closed for administrative reasons.

## 2017-09-28 NOTE — Progress Notes (Signed)
Patient ID: Ruth Rhodes, female   DOB: [birth year], 84 y.o.   MRN: [id number]  [place]   [place] of Service:    Provider:  Toni Arthurs, NP-C  Juluis Pitch, MD  Patient Care Team: Juluis Pitch, MD as PCP - General [facility] Medicine)  Extended Emergency Contact Information Primary Emergency Contact: [facility] Address: [address]          [place]          [place], [place] [address] Johnnette Litter of [place] Phone: [phone] Relation: Friend Secondary Emergency Contact: Francena Hanly States of [place] Phone: [phone] Relation: Friend  Code Status:  DNR Goals of care: Advanced Directive information Advanced Directives 09/28/2017  Does Patient Have a Medical Advance Directive? Yes  Type of Advance Directive Out of facility DNR (pink MOST or yellow form)  Does patient want to make changes to medical advance directive? No - Patient declined  Copy of [place] in Chart? -  Would patient like information on creating a medical advance directive? -  Pre-existing out of facility DNR order (yellow form or pink MOST form) Yellow form placed in chart (order not valid for inpatient use)     Chief Complaint  Patient presents with  . Acute Visit    recheck lump    HPI:  Pt is a 84 y.o. female seen today for an acute visit for lump in the axillary. Pt c/o ~ 2 cm diameter soft nodule in the Left axilla. Lump is tender to touch. Slightly red, some warmth. Pliable. Small amount of thick, cheesy drainage with some pink purulence. Afebrile. Surrounding tissue intact.     Past Medical History:  Diagnosis Date  . Arthritis   . Back pain   . Cancer ([place])    Lung  . Diabetes mellitus without complication ([place])   . Diabetes mellitus, type 2 ([place])   . Hypertension   . Lumbar stenosis   . Osteoarthritis    Past Surgical History:  Procedure Laterality Date  . ABDOMINAL HYSTERECTOMY    . FINE NEEDLE  ASPIRATION Left 09/06/2016   Procedure: FINE NEEDLE ASPIRATION; WITH IMAGING GUIDANCE; Surgeon: Marchia Bond, MD; Location: MAIN OR [facility]; Service: Pulmonary  . LAMINOTOMY  10/08/2015  . LUMBAR LAMINECTOMY WITH COFLEX 2 LEVEL Bilateral 09/23/2015   Procedure: Laminectomy and Foraminotomy L4-L5 bilateral with CoFlex L5-S1 - left;  Surgeon: Karie Chimera, MD;  Location: [place] NEURO ORS;  Service: Neurosurgery;  Laterality: Bilateral;  . WOUND EXPLORATION N/A 09/27/2015   Procedure: WOUND EXPLORATION;  Surgeon: Kevan Ny Ditty, MD;  Location: [place] NEURO ORS;  Service: Neurosurgery;  Laterality: N/A;  WOUND EXPLORATION    Allergies  Allergen Reactions  . Tramadol Nausea Only and Nausea And Vomiting    Allergies as of 09/26/2017      Reactions   Tramadol Nausea Only, Nausea And Vomiting      Medication List        Accurate as of 09/26/17 11:59 PM. Always use your most recent med list.          acetaminophen 325 MG tablet Commonly known as:  TYLENOL Take 650 mg by mouth every 6 (six) hours as needed.   alum & mag hydroxide-simeth 630-160-10 MG/5ML suspension Commonly known as:  MAALOX PLUS Take 30 mLs by mouth every 4 (four) hours as needed for indigestion.   amLODipine 10 MG tablet Commonly known as:  NORVASC Take 10 mg by mouth  daily.   ASPERCREME LIDOCAINE 4 % Ptch Generic drug:  Lidocaine Apply 1 patch topically daily. Apply patch to area of pain on the back. Remove after 12 hours.   benzonatate 100 MG capsule Commonly known as:  TESSALON Take 100 mg by mouth 3 (three) times daily as needed for cough.   carvedilol 25 MG tablet Commonly known as:  COREG Take 1 tablet (25 mg total) by mouth 2 (two) times daily with a meal.   cephALEXin 500 MG capsule Commonly known as:  KEFLEX Take 500 mg by mouth 2 (two) times daily.   Cholecalciferol 2000 units Caps Take 1 capsule by mouth daily.   divalproex 125 MG DR tablet Commonly known as:  DEPAKOTE Take 250 mg by mouth 2  (two) times daily.   FLUoxetine 40 MG capsule Commonly known as:  PROZAC Take 40 mg by mouth daily.   gabapentin 400 MG capsule Commonly known as:  NEURONTIN Take 400 mg by mouth every 8 (eight) hours.   GLUCERNA Liqd Take 237 mLs by mouth 2 (two) times daily.   guaiFENesin 100 MG/5ML liquid Commonly known as:  ROBITUSSIN Take 200 mg by mouth every 4 (four) hours as needed for cough. Notify the provider if cough worsens/ continues longer than 3 days   lisinopril 10 MG tablet Commonly known as:  PRINIVIL,ZESTRIL Take 10 mg by mouth daily.   magnesium hydroxide 400 MG/5ML suspension Commonly known as:  MILK OF MAGNESIA Take 30 mLs by mouth every 4 (four) hours as needed for mild constipation. If Constipation/ no BM for 2 days   Menthol 2.7 MG Lozg Use as directed 1 lozenge in the mouth or throat every 2 (two) hours as needed.   mirtazapine 15 MG tablet Commonly known as:  REMERON Take 15 mg by mouth at bedtime.   oxyCODONE-acetaminophen 5-325 MG tablet Commonly known as:  ROXICET Take 1 tablet by mouth every 4 (four) hours as needed for severe pain.   polyethylene glycol powder powder Commonly known as:  GLYCOLAX/MIRALAX Take 17 g by mouth daily.   potassium chloride SA 20 MEQ tablet Commonly known as:  KLOR-CON M20 Take 1 tablet (20 mEq total) by mouth daily.   pravastatin 10 MG tablet Commonly known as:  PRAVACHOL Take 10 mg by mouth once a week. On sunday   sennosides-docusate sodium 8.6-50 MG tablet Commonly known as:  SENOKOT-S Take 2 tablets by mouth 2 (two) times daily.   white petrolatum ointment Apply topically to dry skin/heels as needed. May keep at bedside       Review of Systems  Constitutional: Negative for activity change, appetite change, chills, diaphoresis and fever.  Musculoskeletal: Arthralgias: typical arthritis.  Skin: Positive for color change and wound. Negative for pallor and rash.  All other systems reviewed and are  negative.   Immunization History  Administered Date(s) Administered  . PPD Test 01/09/2017   Pertinent  Health Maintenance Due  Topic Date Due  . FOOT EXAM  04/29/1944  . OPHTHALMOLOGY EXAM  04/29/1944  . DEXA SCAN  04/29/2001  . PNA vac Low Risk Adult (1 of 2 - PCV13) 04/29/2001  . INFLUENZA VACCINE  07/14/18 (Originally 04/18/2017)  . HEMOGLOBIN A1C  01/21/2018   No flowsheet data found. Functional Status Survey:    Vitals:   09/28/17 1455  BP: (!) 141/63  Pulse: 73  Resp: (!) 24  Temp: 97.9 F (36.6 C)  TempSrc: Oral  SpO2: 94%  Weight: 122 lb (55.3 kg)  Height: 5'  2" (1.575 m)   Body mass index is 22.31 kg/m. Physical Exam  Constitutional: She appears well-developed and well-nourished.  Neurological: She is alert.  Skin: Lesion noted.  Impacted and infected sebaceous cyst of left axilla  Psychiatric: Her affect is blunt. She is withdrawn. Cognition and memory are impaired. She exhibits a depressed mood. She exhibits abnormal recent memory and abnormal remote memory.  Nursing note and vitals reviewed.   Labs reviewed: Recent Labs    05/15/17 0525 07/24/17 0620 08/21/17 0606  NA 138 138 139  K 3.9 4.0 4.1  CL 102 99* 104  CO2 28 30 27   GLUCOSE 221* 168* 151*  BUN 14 11 12   CREATININE 0.49 0.44 0.40*  CALCIUM 9.7 9.7 9.2  MG  --  1.9 1.7   Recent Labs    01/07/17 1421 07/24/17 0620 08/21/17 0606  AST 22 17 16   ALT 15 12* 9*  ALKPHOS 102 96 81  BILITOT 0.5 0.7 0.3  PROT 8.3* 7.1 6.5  ALBUMIN 3.9 3.4* 3.0*   Recent Labs    01/07/17 1421 01/08/17 0622 07/24/17 0620 08/21/17 0606  WBC 6.5 5.8 6.1 5.6  NEUTROABS 3.7  --  2.9 2.5  HGB 13.0 11.5* 13.5 13.1  HCT 38.1 33.8* 39.7 37.8  MCV 82.0 82.6 83.3 85.1  PLT 393 356 300 239   Lab Results  Component Value Date   TSH 1.070 08/21/2017   Lab Results  Component Value Date   HGBA1C 8.9 (H) 07/24/2017   No results found for: CHOL, HDL, LDLCALC, LDLDIRECT, TRIG, CHOLHDL  Significant  Diagnostic Results in last 30 days:  No results found.  Assessment/Plan Sebaceous cyst of left axilla  Keflex 500 mg p.o. twice daily times 7 days  Warm, moist compresses to left axilla 4 times daily until resolved  Family/ staff Communication:  Total Time:  Documentation:  Face to Face:  Family/Phone:   Labs/tests ordered:    Medication list reviewed and assessed for continued appropriateness.  Vikki Ports, NP-C Geriatrics [facility] [phone], [place] [address] Cell Phone (Mon-Fri 8am-5pm):  [phone] On Call:  [phone] & follow prompts after 5pm & weekends Office Phone:  [phone] Office Fax:  [phone]

## 2017-10-05 NOTE — Progress Notes (Signed)
Location:    Nursing Home Room Number: 303A Place of Service:  SNF (31) Provider:  Toni Arthurs, NP-C  Juluis Pitch, MD  Patient Care Team: Juluis Pitch, MD as PCP - [facility])  Extended Emergency Contact Information Primary Emergency Contact: [facility] Address: [address]          [place]          [place], [place] [address] Johnnette Litter of [place] Phone: [phone] Relation: Friend Secondary Emergency Contact: Francena Hanly [place] of [place] Phone: [phone] Relation: Friend  Code Status: DNR Goals of care: Advanced Directive information Advanced Directives 09/28/2017  Does Patient Have a Medical Advance Directive? Yes  Type of Advance Directive Out of facility DNR (pink MOST or yellow form)  Does patient want to make changes to medical advance directive? No - Patient declined  Copy of [place] in Chart? -  Would patient like information on creating a medical advance directive? -  Pre-existing out of facility DNR order (yellow form or pink MOST form) Yellow form placed in chart (order not valid for inpatient use)     Chief Complaint  Patient presents with  . Medical Management of Chronic Issues    Routine Visit    HPI:  Pt is a 84 y.o. female seen today for medical management of chronic diseases.    Osteoarthritis Stable.  Symptoms typically managed with Percocet 01/19/2024 1 tablet p.o. every 4 hours as needed, Aspercreme 4% lidocaine patch daily-the patient many times refuses.  Prozac 40 mg p.o. daily and Neurontin 400 mg p.o. every 8 hours for adjunct.  Patient is now bedbound/non-ambulatory.  Muscle weakness (generalized) No change.  Patient is bedbound and nonambulatory  Moderate major depression ([place]) Variable.  Symptoms have improved more recently.  Patient is on Prozac 40 mg p.o. daily.  Patient was explained that this medication would also help with her leg pain.  Otherwise, she would  have refused the medication.  Patient is also on Depakote 250 mg p.o. twice daily for mood stabilization as well as for headache prophylaxis.  Remeron 15 mg p.o. nightly for depression as well as appetite stimulant.  Overall, mood is more stable, but continues with intermittent episodes of depression/crying with anxiety.  Patient is very hesitant to allow medication changes or adjustments.    Past Medical History:  Diagnosis Date  . Arthritis   . Back pain   . Cancer ([place])    Lung  . Diabetes mellitus without complication ([place])   . Diabetes mellitus, type 2 ([place])   . Hypertension   . Lumbar stenosis   . Osteoarthritis    Past Surgical History:  Procedure Laterality Date  . ABDOMINAL HYSTERECTOMY    . FINE NEEDLE ASPIRATION Left 09/06/2016   Procedure: FINE NEEDLE ASPIRATION; WITH IMAGING GUIDANCE; Surgeon: Marchia Bond, MD; Location[facility]; Service: Pulmonary  . LAMINOTOMY  10/08/2015  . LUMBAR LAMINECTOMY WITH COFLEX 2 LEVEL Bilateral 09/23/2015   Procedure: Laminectomy and Foraminotomy L4-L5 bilateral with CoFlex L5-S1 - left;  Surgeon: Karie Chimera, MD;  Location: [place] NEURO ORS;  Service: Neurosurgery;  Laterality: Bilateral;  . WOUND EXPLORATION N/A 09/27/2015   Procedure: WOUND EXPLORATION;  Surgeon: Kevan Ny Ditty, MD;  Location: [place] NEURO ORS;  Service: Neurosurgery;  Laterality: N/A;  WOUND EXPLORATION    Allergies  Allergen Reactions  . Tramadol Nausea Only and Nausea And Vomiting    Allergies as of 09/14/2017  Reactions   Tramadol Nausea Only, Nausea And Vomiting      Medication List        Accurate as of 09/14/17 11:59 PM. Always use your most recent med list.          acetaminophen 325 MG tablet Commonly known as:  TYLENOL Take 650 mg by mouth every 6 (six) hours as needed.   alum & mag hydroxide-simeth 732-202-54 MG/5ML suspension Commonly known as:  MAALOX PLUS Take 30 mLs by mouth every 4 (four) hours as needed for indigestion.     amLODipine 10 MG tablet Commonly known as:  NORVASC Take 10 mg by mouth daily.   ASPERCREME LIDOCAINE 4 % Ptch Generic drug:  Lidocaine Apply 1 patch topically daily. Apply patch to area of pain on the back. Remove after 12 hours.   benzonatate 100 MG capsule Commonly known as:  TESSALON Take 100 mg by mouth 3 (three) times daily as needed for cough.   carvedilol 25 MG tablet Commonly known as:  COREG Take 1 tablet (25 mg total) by mouth 2 (two) times daily with a meal.   Cholecalciferol 2000 units Caps Take 1 capsule by mouth daily.   divalproex 125 MG DR tablet Commonly known as:  DEPAKOTE Take 250 mg by mouth 2 (two) times daily.   FLUoxetine 40 MG capsule Commonly known as:  PROZAC Take 40 mg by mouth daily.   gabapentin 400 MG capsule Commonly known as:  NEURONTIN Take 400 mg by mouth every 8 (eight) hours.   GLUCERNA Liqd Take 237 mLs by mouth 2 (two) times daily.   guaiFENesin 100 MG/5ML liquid Commonly known as:  ROBITUSSIN Take 200 mg by mouth every 4 (four) hours as needed for cough. Notify the provider if cough worsens/ continues longer than 3 days   lisinopril 10 MG tablet Commonly known as:  PRINIVIL,ZESTRIL Take 10 mg by mouth daily.   magnesium hydroxide 400 MG/5ML suspension Commonly known as:  MILK OF MAGNESIA Take 30 mLs by mouth every 4 (four) hours as needed for mild constipation. If Constipation/ no BM for 2 days   Menthol 2.7 MG Lozg Use as directed 1 lozenge in the mouth or throat every 2 (two) hours as needed.   mirtazapine 15 MG tablet Commonly known as:  REMERON Take 15 mg by mouth at bedtime.   oxyCODONE-acetaminophen 5-325 MG tablet Commonly known as:  ROXICET Take 1 tablet by mouth every 4 (four) hours as needed for severe pain.   polyethylene glycol powder powder Commonly known as:  GLYCOLAX/MIRALAX Take 17 g by mouth daily.   potassium chloride SA 20 MEQ tablet Commonly known as:  KLOR-CON M20 Take 1 tablet (20 mEq total)  by mouth daily.   pravastatin 10 MG tablet Commonly known as:  PRAVACHOL Take 10 mg by mouth once a week. On sunday   sennosides-docusate sodium 8.6-50 MG tablet Commonly known as:  SENOKOT-S Take 2 tablets by mouth 2 (two) times daily.   white petrolatum ointment Apply topically to dry skin/heels as needed. May keep at bedside       Review of Systems  Constitutional: Negative for activity change, appetite change, chills, diaphoresis and fever.  HENT: Negative for congestion, mouth sores, nosebleeds, postnasal drip, sneezing, sore throat, trouble swallowing and voice change.   Respiratory: Negative for apnea, cough, choking, chest tightness, shortness of breath and wheezing.   Cardiovascular: Negative for chest pain, palpitations and leg swelling.  Gastrointestinal: Negative for abdominal distention, abdominal pain, constipation, diarrhea  and nausea.  Genitourinary: Negative for difficulty urinating, dysuria, frequency and urgency.  Musculoskeletal: Positive for arthralgias (typical arthritis). Negative for back pain, gait problem and myalgias.  Skin: Negative for color change, pallor, rash and wound.  Neurological: Negative for dizziness, tremors, syncope, speech difficulty, weakness, numbness and headaches.  Psychiatric/Behavioral: Positive for confusion and dysphoric mood. Negative for agitation and behavioral problems.  All other systems reviewed and are negative.   Immunization History  Administered Date(s) Administered  . PPD Test 01/09/2017   Pertinent  Health Maintenance Due  Topic Date Due  . FOOT EXAM  04/29/1944  . OPHTHALMOLOGY EXAM  04/29/1944  . DEXA SCAN  04/29/2001  . PNA vac Low Risk Adult (1 of 2 - PCV13) 04/29/2001  . INFLUENZA VACCINE  07/09/2018 (Originally 04/18/2017)  . HEMOGLOBIN A1C  01/21/2018   No flowsheet data found. Functional Status Survey:    Vitals:   09/14/17 1026  BP: (!) 110/58  Pulse: 69  Resp: 20  Temp: 97.7 F (36.5 C)    TempSrc: Oral  SpO2: 94%  Weight: 126 lb 8 oz (57.4 kg)  Height: 5\' 2"  (1.575 m)   Body mass index is 23.14 kg/m. Physical Exam  Constitutional: She is oriented to person, place, and time. Vital signs are normal. She appears well-developed and well-nourished. She is active and cooperative. She does not appear ill. No distress.  HENT:  Head: Normocephalic and atraumatic.  Mouth/Throat: Uvula is midline, oropharynx is clear and moist and mucous membranes are normal. Mucous membranes are not pale, not dry and not cyanotic.  Eyes: Conjunctivae, EOM and lids are normal. Pupils are equal, round, and reactive to light.  Neck: Trachea normal, normal range of motion and full passive range of motion without pain. Neck supple. No JVD present. No tracheal deviation, no edema and no erythema present. No thyromegaly present.  Cardiovascular: Normal rate, normal heart sounds, intact distal pulses and normal pulses. An irregular rhythm present. Exam reveals no gallop, no distant heart sounds and no friction rub.  No murmur heard. Pulses:      Dorsalis pedis pulses are 2+ on the right side, and 2+ on the left side.  No edema  Pulmonary/Chest: Effort normal and breath sounds normal. No accessory muscle usage. No respiratory distress. She has no decreased breath sounds. She has no wheezes. She has no rhonchi. She has no rales. She exhibits no tenderness.  Abdominal: Soft. Normal appearance and bowel sounds are normal. She exhibits no distension and no ascites. There is no tenderness.  Musculoskeletal: Normal range of motion. She exhibits no edema or tenderness.  Expected osteoarthritis, stiffness; Bilateral Calves soft, supple. Negative Homan's Sign. B- pedal pulses equal; generalized weakness  Neurological: She is alert and oriented to person, place, and time. She has normal strength. She displays atrophy. She exhibits abnormal muscle tone. Coordination and gait abnormal.  Skin: Skin is warm, dry and intact.  She is not diaphoretic. No cyanosis. No pallor. Nails show no clubbing.  Psychiatric: Her speech is normal and behavior is normal. Thought content normal. Her affect is blunt. Cognition and memory are impaired. She expresses inappropriate judgment. She exhibits a depressed mood. She exhibits abnormal recent memory and abnormal remote memory.  Nursing note and vitals reviewed.   Labs reviewed: Recent Labs    05/15/17 0525 07/24/17 0620 08/21/17 0606  NA 138 138 139  K 3.9 4.0 4.1  CL 102 99* 104  CO2 28 30 27   GLUCOSE 221* 168* 151*  BUN 14  11 12  CREATININE 0.49 0.44 0.40*  CALCIUM 9.7 9.7 9.2  MG  --  1.9 1.7   Recent Labs    01/07/17 1421 07/24/17 0620 08/21/17 0606  AST 22 17 16   ALT 15 12* 9*  ALKPHOS 102 96 81  BILITOT 0.5 0.7 0.3  PROT 8.3* 7.1 6.5  ALBUMIN 3.9 3.4* 3.0*   Recent Labs    01/07/17 1421 01/08/17 0622 07/24/17 0620 08/21/17 0606  WBC 6.5 5.8 6.1 5.6  NEUTROABS 3.7  --  2.9 2.5  HGB 13.0 11.5* 13.5 13.1  HCT 38.1 33.8* 39.7 37.8  MCV 82.0 82.6 83.3 85.1  PLT 393 356 300 239   Lab Results  Component Value Date   TSH 1.070 08/21/2017   Lab Results  Component Value Date   HGBA1C 8.9 (H) 07/24/2017   No results found for: CHOL, HDL, LDLCALC, LDLDIRECT, TRIG, CHOLHDL  Significant Diagnostic Results in last 30 days:  No results found.  Assessment/Plan Djuna was seen today for medical management of chronic issues.  Diagnoses and all orders for this visit:  Osteoarthritis, unspecified osteoarthritis type, unspecified site  Muscle weakness (generalized)  Moderate major depression (HCC)   Condition stable unless otherwise indicated  Continue current medication regimen  Continue to encourage patient to get out of the bed to chair/participate in activities  Emotional and spiritual support as allowed  Continue range of motion exercises with ADLs  Family/ staff Communication:   Total Time:  Documentation:  Face to  Face:  Family/Phone:   Labs/tests ordered: Not due  Medication list reviewed and assessed for continued appropriateness. Monthly medication orders reviewed and signed.  Vikki Ports, NP-C Geriatrics [facility] [phone], [place] [address] Cell Phone (Mon-Fri 8am-5pm):  [phone] On Call:  [phone] & follow prompts after 5pm & weekends Office Phone:  [phone] Office Fax:  [phone]

## 2017-10-05 NOTE — Assessment & Plan Note (Signed)
Likely metastatic. Being followed by Palliative Care. No treatment at this time

## 2017-10-05 NOTE — Progress Notes (Signed)
Location:    Nursing Home Room Number: 303A Place of Service:  SNF (31) Provider:  Toni Arthurs, NP-C  Juluis Pitch, MD  Patient Care Team: Juluis Pitch, MD as PCP - [facility] Medicine)  Extended Emergency Contact Information Primary Emergency Contact: [facility] Address: [address]          [place]          [place], [place] [address] Johnnette Litter of [place] Phone: [phone] Relation: Friend Secondary Emergency Contact: Francena Hanly States of [place] Phone: [phone] Relation: Friend  Code Status:  DNR Goals of care: Advanced Directive information Advanced Directives 09/28/2017  Does Patient Have a Medical Advance Directive? Yes  Type of Advance Directive Out of facility DNR (pink MOST or yellow form)  Does patient want to make changes to medical advance directive? No - Patient declined  Copy of [place] in Chart? -  Would patient like information on creating a medical advance directive? -  Pre-existing out of facility DNR order (yellow form or pink MOST form) Yellow form placed in chart (order not valid for inpatient use)     Chief Complaint  Patient presents with  . Medical Management of Chronic Issues    Routine Visit    HPI:  Pt is a 84 y.o. female seen today for medical management of chronic diseases.    Metastatic primary lung cancer, left ([place]) Likely metastatic. Being followed by Palliative Care. No treatment at this time  Essential (primary) hypertension Stable. No episodes of hypotension. Pressures controlled on current regimen of Coreg, Lisinopril and Norvasc  Type 2 diabetes mellitus with diabetic neuropathy, unspecified (HCC) Stable. No episodes of hypoglycemia. No new complaints of neuropathy of the extremeties. Symptoms controlled with Neurontin and Prozac. No medication management needed of DM.   Patient is being followed by palliative Care.   Past Medical History:  Diagnosis Date    . Arthritis   . Back pain   . Cancer ([place])    Lung  . Diabetes mellitus without complication ([place])   . Diabetes mellitus, type 2 ([place])   . Hypertension   . Lumbar stenosis   . Osteoarthritis    Past Surgical History:  Procedure Laterality Date  . ABDOMINAL HYSTERECTOMY    . FINE NEEDLE ASPIRATION Left 09/06/2016   Procedure: FINE NEEDLE ASPIRATION; WITH IMAGING GUIDANCE; Surgeon: Marchia Bond, MD; Location: MA[facility] OR [facility]; Service: Pulmonary  . LAMINOTOMY  10/08/2015  . LUMBAR LAMINECTOMY WITH COFLEX 2 LEVEL Bilateral 09/23/2015   Procedure: Laminectomy and Foraminotomy L4-L5 bilateral with CoFlex L5-S1 - left;  Surgeon: Karie Chimera, MD;  Location: [facility] NEURO ORS;  Service: Neurosurgery;  Laterality: Bilateral;  . WOUND EXPLORATION N/A 09/27/2015   Procedure: WOUND EXPLORATION;  Surgeon: Kevan Ny Ditty, MD;  Location: [place] [facility] NEURO ORS;  Service: Neurosurgery;  Laterality: N/A;  WOUND EXPLORATION    Allergies  Allergen Reactions  . Tramadol Nausea Only and Nausea And Vomiting    Allergies as of 08/17/2017      Reactions   Tramadol Nausea Only, Nausea And Vomiting      Medication List        Accurate as of 08/17/17 11:59 PM. Always use your most recent med list.          acetaminophen 325 MG tablet Commonly known as:  TYLENOL Take 650 mg by mouth every 6 (six) hours as needed. Do not exceed 3000 mg in 24 hour period   alum & mag  hydroxide-simeth 161-096-04 MG/5ML suspension Commonly known as:  MAALOX PLUS Take 30 mLs by mouth every 4 (four) hours as needed for indigestion.   amLODipine 10 MG tablet Commonly known as:  NORVASC Take 10 mg by mouth daily.   ASPERCREME LIDOCAINE 4 % Ptch Generic drug:  Lidocaine Apply 1 patch topically daily. Apply patch to area of pain on the back. Remove after 12 hours.   benzonatate 100 MG capsule Commonly known as:  TESSALON Take 100 mg by mouth 3 (three) times daily as needed for cough.   carvedilol 25 MG tablet Commonly  known as:  COREG Take 1 tablet (25 mg total) by mouth 2 (two) times daily with a meal.   Cholecalciferol 2000 units Caps Take 1 capsule by mouth daily.   divalproex 125 MG DR tablet Commonly known as:  DEPAKOTE Take 250 mg by mouth 2 (two) times daily.   FLUoxetine 40 MG capsule Commonly known as:  PROZAC Take 40 mg by mouth daily.   gabapentin 400 MG capsule Commonly known as:  NEURONTIN Take 400 mg by mouth every 8 (eight) hours.   GLUCERNA Liqd Take 237 mLs by mouth 2 (two) times daily.   guaiFENesin 100 MG/5ML liquid Commonly known as:  ROBITUSSIN Take 200 mg by mouth every 4 (four) hours as needed for cough. Notify the provider if cough worsens/ continues longer than 3 days   lisinopril 10 MG tablet Commonly known as:  PRINIVIL,ZESTRIL Take 10 mg by mouth daily.   magnesium hydroxide 400 MG/5ML suspension Commonly known as:  MILK OF MAGNESIA Take 30 mLs by mouth every 4 (four) hours as needed for mild constipation. If Constipation/ no BM for 2 days   Menthol 2.7 MG Lozg Use as directed 1 lozenge in the mouth or throat every 2 (two) hours as needed.   mirtazapine 15 MG tablet Commonly known as:  REMERON Take 15 mg by mouth at bedtime.   oxyCODONE-acetaminophen 5-325 MG tablet Commonly known as:  ROXICET Take 1 tablet by mouth every 4 (four) hours as needed for severe pain.   polyethylene glycol powder powder Commonly known as:  GLYCOLAX/MIRALAX Take 17 g by mouth daily.   potassium chloride SA 20 MEQ tablet Commonly known as:  KLOR-CON M20 Take 1 tablet (20 mEq total) by mouth daily.   sennosides-docusate sodium 8.6-50 MG tablet Commonly known as:  SENOKOT-S Take 2 tablets by mouth 2 (two) times daily.   white petrolatum ointment Apply topically to dry skin/heels as needed. May keep at bedside       Review of Systems  Constitutional: Negative for activity change, appetite change, chills, diaphoresis and fever.  HENT: Negative for congestion, mouth  sores, nosebleeds, postnasal drip, sneezing, sore throat, trouble swallowing and voice change.   Respiratory: Negative for apnea, cough, choking, chest tightness, shortness of breath and wheezing.   Cardiovascular: Negative for chest pain, palpitations and leg swelling.  Gastrointestinal: Negative for abdominal distention, abdominal pain, constipation, diarrhea and nausea.  Genitourinary: Negative for difficulty urinating, dysuria, frequency and urgency.  Musculoskeletal: Positive for arthralgias (typical arthritis). Negative for back pain, gait problem and myalgias.  Skin: Negative for color change, pallor, rash and wound.  Neurological: Negative for dizziness, tremors, syncope, speech difficulty, weakness, numbness and headaches.  Psychiatric/Behavioral: Negative for agitation and behavioral problems.  All other systems reviewed and are negative.   Immunization History  Administered Date(s) Administered  . PPD Test 01/09/2017   Pertinent  Health Maintenance Due  Topic Date Due  .  FOOT EXAM  04/29/1944  . OPHTHALMOLOGY EXAM  04/29/1944  . DEXA SCAN  04/29/2001  . PNA vac Low Risk Adult (1 of 2 - PCV13) 04/29/2001  . INFLUENZA VACCINE  06/29/2018 (Originally 04/18/2017)  . HEMOGLOBIN A1C  01/21/2018   No flowsheet data found. Functional Status Survey:    Vitals:   08/17/17 1024  BP: (!) 114/52  Pulse: 68  Resp: 18  Temp: 98.2 F (36.8 C)  TempSrc: Oral  SpO2: 94%  Weight: 125 lb (56.7 kg)  Height: 5' 2"  (1.575 m)   Body mass index is 22.86 kg/m. Physical Exam  Constitutional: She is oriented to person, place, and time. Vital signs are normal. She appears well-developed and well-nourished. She is active and cooperative. She does not appear ill. No distress.  HENT:  Head: Normocephalic and atraumatic.  Mouth/Throat: Uvula is midline, oropharynx is clear and moist and mucous membranes are normal. Mucous membranes are not pale, not dry and not cyanotic.  Eyes: Conjunctivae,  EOM and lids are normal. Pupils are equal, round, and reactive to light.  Neck: Trachea normal, normal range of motion and full passive range of motion without pain. Neck supple. No JVD present. No tracheal deviation, no edema and no erythema present. No thyromegaly present.  Cardiovascular: Normal rate, normal heart sounds, intact distal pulses and normal pulses. An irregular rhythm present. Exam reveals no gallop, no distant heart sounds and no friction rub.  No murmur heard. Pulses:      Dorsalis pedis pulses are 2+ on the right side, and 2+ on the left side.  No edema  Pulmonary/Chest: Effort normal and breath sounds normal. No accessory muscle usage. No respiratory distress. She has no decreased breath sounds. She has no wheezes. She has no rhonchi. She has no rales. She exhibits no tenderness.  Abdominal: Soft. Normal appearance and bowel sounds are normal. She exhibits no distension and no ascites. There is no tenderness.  Musculoskeletal: Normal range of motion. She exhibits no edema or tenderness.  Expected osteoarthritis, stiffness; Bilateral Calves soft, supple. Negative Homan's Sign. B- pedal pulses equal  Neurological: She is alert and oriented to person, place, and time. She has normal strength.  Skin: Skin is warm, dry and intact. She is not diaphoretic. No cyanosis. No pallor. Nails show no clubbing.  Psychiatric: Her speech is normal and behavior is normal. Judgment and thought content normal. Her affect is labile. Cognition and memory are impaired. She exhibits a depressed mood. She exhibits abnormal recent memory and abnormal remote memory.  Nursing note and vitals reviewed.   Labs reviewed: Recent Labs    05/15/17 0525 07/24/17 0620 08/21/17 0606  NA 138 138 139  K 3.9 4.0 4.1  CL 102 99* 104  CO2 28 30 27   GLUCOSE 221* 168* 151*  BUN 14 11 12   CREATININE 0.49 0.44 0.40*  CALCIUM 9.7 9.7 9.2  MG  --  1.9 1.7   Recent Labs    01/07/17 1421 07/24/17 0620  08/21/17 0606  AST 22 17 16   ALT 15 12* 9*  ALKPHOS 102 96 81  BILITOT 0.5 0.7 0.3  PROT 8.3* 7.1 6.5  ALBUMIN 3.9 3.4* 3.0*   Recent Labs    01/07/17 1421 01/08/17 0622 07/24/17 0620 08/21/17 0606  WBC 6.5 5.8 6.1 5.6  NEUTROABS 3.7  --  2.9 2.5  HGB 13.0 11.5* 13.5 13.1  HCT 38.1 33.8* 39.7 37.8  MCV 82.0 82.6 83.3 85.1  PLT 393 356 300 239  Lab Results  Component Value Date   TSH 1.070 08/21/2017   Lab Results  Component Value Date   HGBA1C 8.9 (H) 07/24/2017   No results found for: CHOL, HDL, LDLCALC, LDLDIRECT, TRIG, CHOLHDL  Significant Diagnostic Results in last 30 days:  No results found.  Assessment/Plan Aubria was seen today for medical management of chronic issues.  Diagnoses and all orders for this visit:  Essential (primary) hypertension  Metastatic primary lung cancer, left ([place])  Type 2 diabetes mellitus with diabetic neuropathy, without long-term current use of insulin (HCC)   Conditions stable  Continue current medication regimen  Continue to have Palliative medicine follow pt, Hospice when appropriate  Monitor skin integrity frequently  Family/ staff Communication:   Total Time:  Documentation:  Face to Face:  Family/Phone:   Labs/tests ordered:  Cbc, met c, mag, Vitamin D, B12, TSH, Free Depakote level  Medication list reviewed and assessed for continued appropriateness. Monthly medication orders reviewed and signed.  Vikki Ports, NP-C Geriatrics [facility] [phone], [place] [address] Cell Phone (Mon-Fri 8am-5pm):  [phone] On Call:  [phone] & follow prompts after 5pm & weekends Office Phone:  [phone] Office Fax:  [phone]

## 2017-10-05 NOTE — Assessment & Plan Note (Signed)
No change.  Patient is bedbound and nonambulatory

## 2017-10-05 NOTE — Assessment & Plan Note (Signed)
Stable. No episodes of hypoglycemia. No new complaints of neuropathy of the extremeties. Symptoms controlled with Neurontin and Prozac. No medication management needed of DM.

## 2017-10-05 NOTE — Assessment & Plan Note (Signed)
Variable.  Symptoms have improved more recently.  Patient is on Prozac 40 mg p.o. daily.  Patient was explained that this medication would also help with her leg pain.  Otherwise, she would have refused the medication.  Patient is also on Depakote 250 mg p.o. twice daily for mood stabilization as well as for headache prophylaxis.  Remeron 15 mg p.o. nightly for depression as well as appetite stimulant.  Overall, mood is more stable, but continues with intermittent episodes of depression/crying with anxiety.  Patient is very hesitant to allow medication changes or adjustments.

## 2017-10-05 NOTE — Assessment & Plan Note (Signed)
Stable.  Symptoms typically managed with Percocet 01/19/2024 1 tablet p.o. every 4 hours as needed, Aspercreme 4% lidocaine patch daily-the patient many times refuses.  Prozac 40 mg p.o. daily and Neurontin 400 mg p.o. every 8 hours for adjunct.  Patient is now bedbound/non-ambulatory.

## 2017-10-05 NOTE — Assessment & Plan Note (Addendum)
Stable. No episodes of hypotension. Pressures controlled on current regimen of Coreg, Lisinopril and Norvasc

## 2017-10-09 IMAGING — CT CT ABD-PELV W/ CM
1 of 3 series · 14 of 32 positions shown, 19 images · IV contrast (iopamidol)
Comparison: MRI of the lumbar spine performed 05/26/2015

CLINICAL DATA: Acute onset of left lower quadrant abdominal pain
and nausea. Dizziness and chills. Initial encounter.

EXAM:
CT ABDOMEN AND PELVIS WITH CONTRAST
TECHNIQUE: Multidetector CT imaging of the abdomen and pelvis was performed
using the standard protocol following bolus administration of
intravenous contrast.
CONTRAST:  100mL 2G6EM3-TFF IOPAMIDOL (2G6EM3-TFF) INJECTION 61%

[Series 2: routine abd pel with · axial · 0.66mm/px · z∈[-424,-38]mm · 14 of 87 slices shown, 19 images]
[im 5/87  soft-tissue]
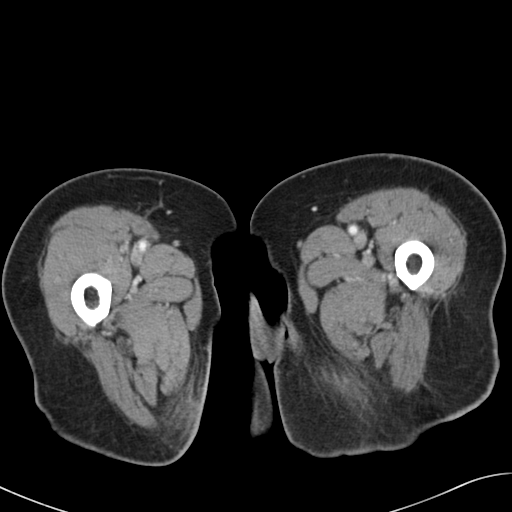
[im 5/87  bone]
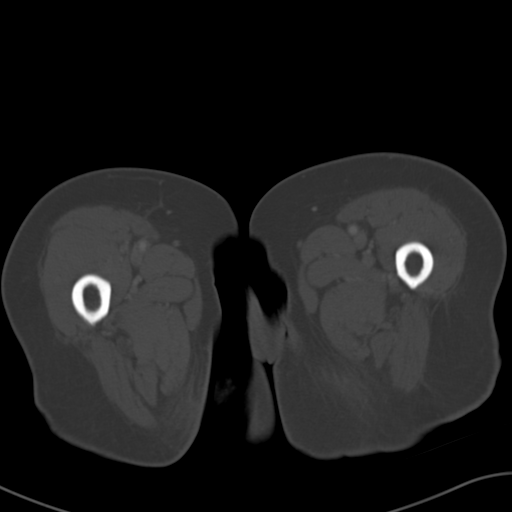
[im 13/87  soft-tissue]
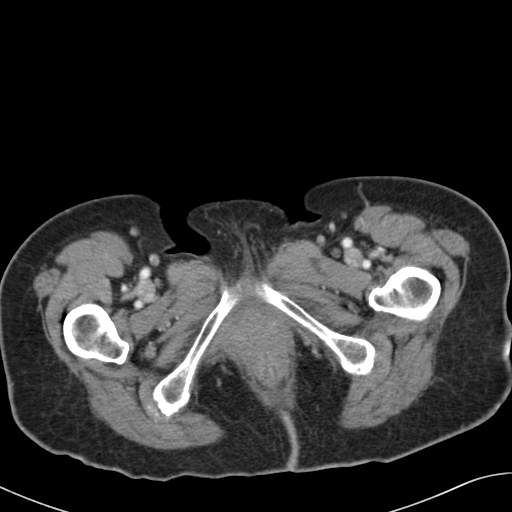
[im 18/87  soft-tissue]
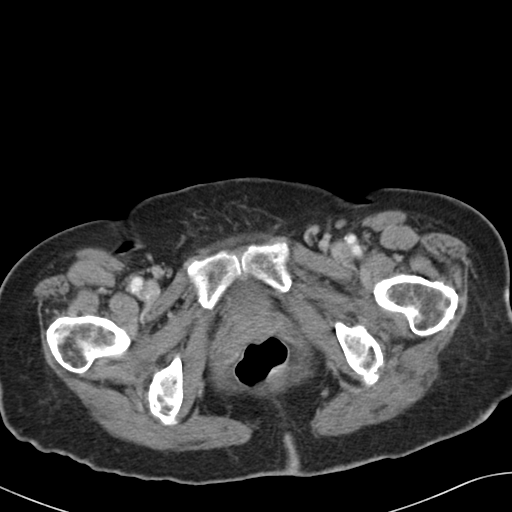
[im 26/87  soft-tissue]
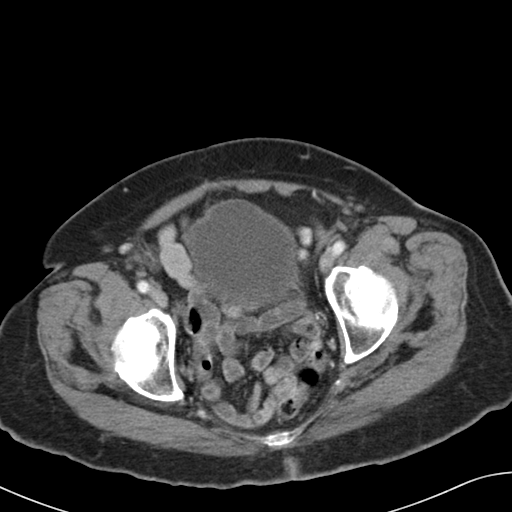
[im 31/87  soft-tissue]
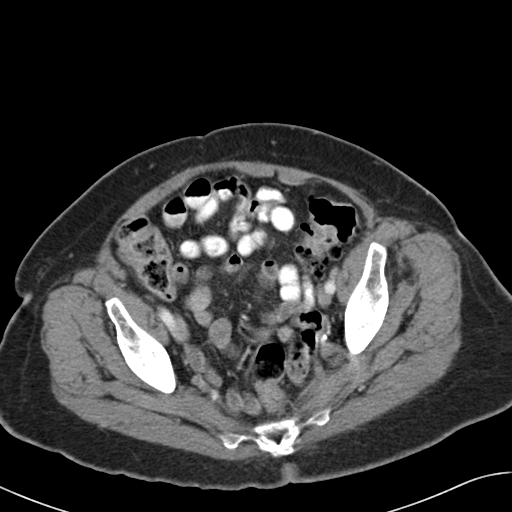
[im 39/87  soft-tissue]
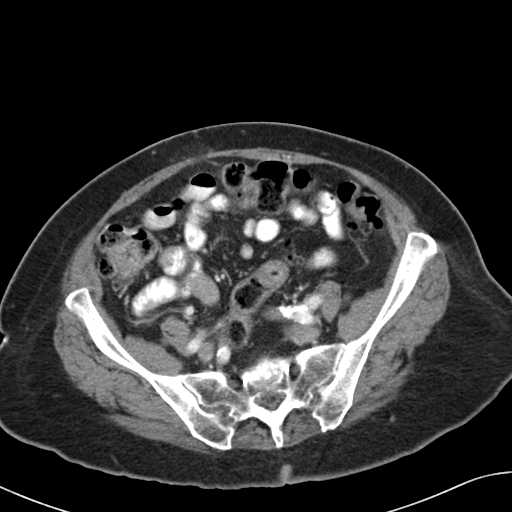
[im 44/87  soft-tissue]
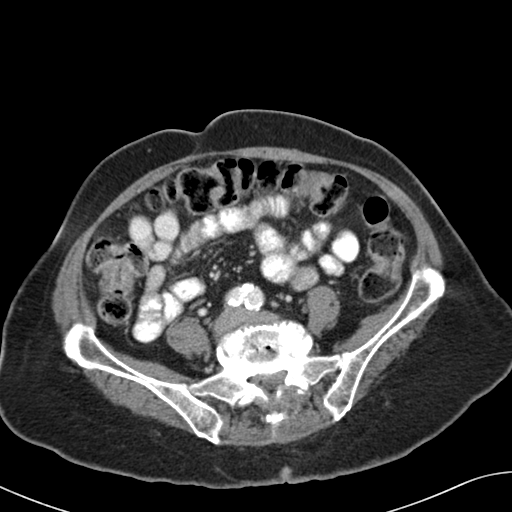
[im 48/87  soft-tissue]
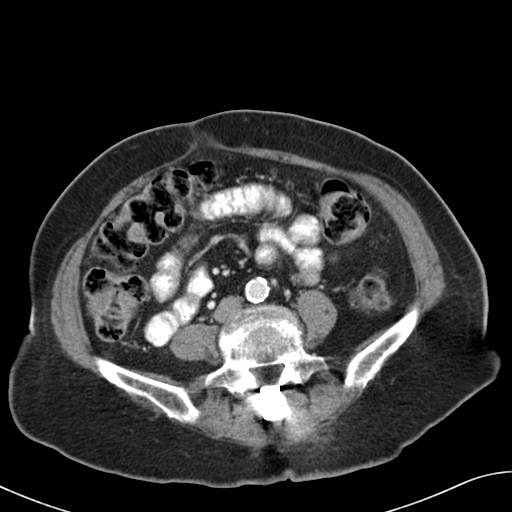
[im 56/87  soft-tissue]
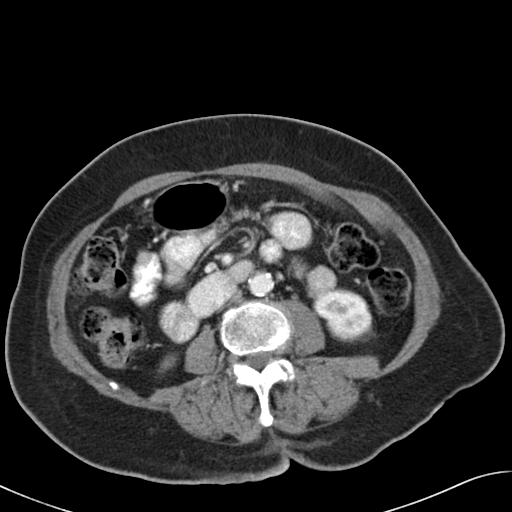
[im 56/87  bone]
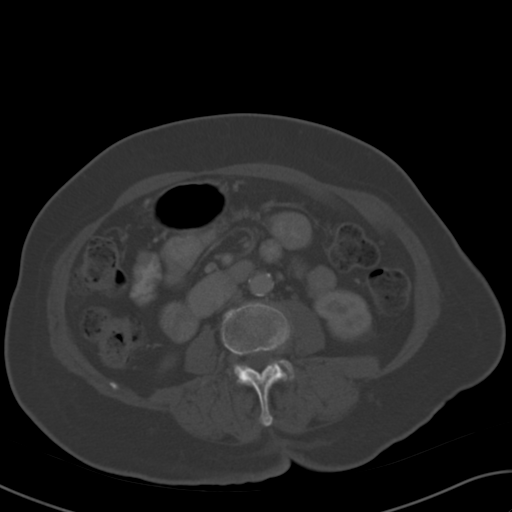
[im 61/87  soft-tissue]
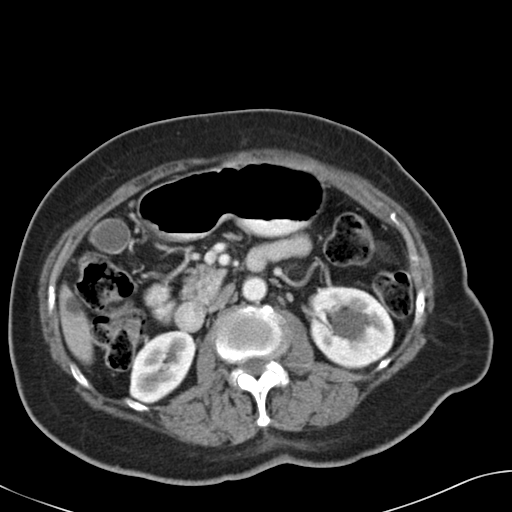
[im 69/87  soft-tissue]
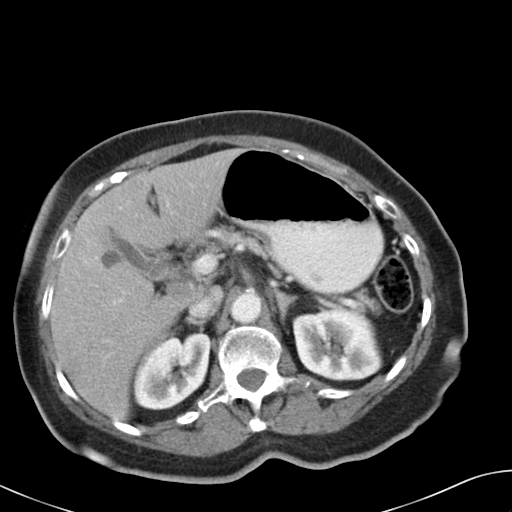
[im 69/87  lung]
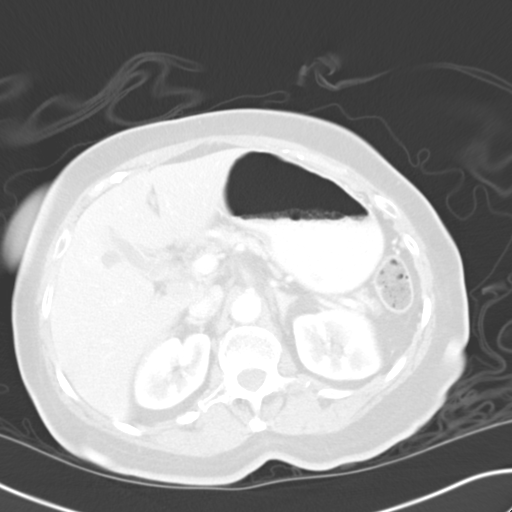
[im 74/87  soft-tissue]
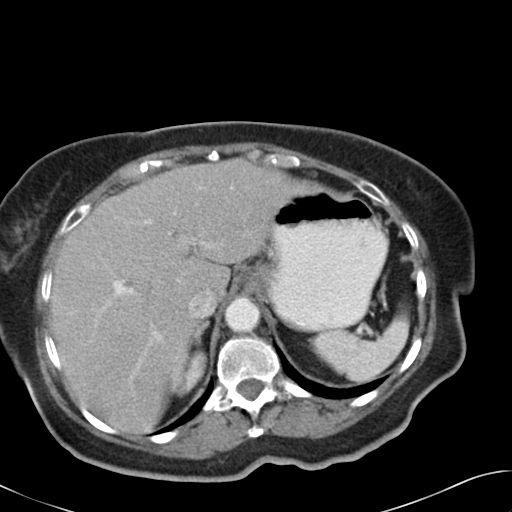
[im 74/87  lung]
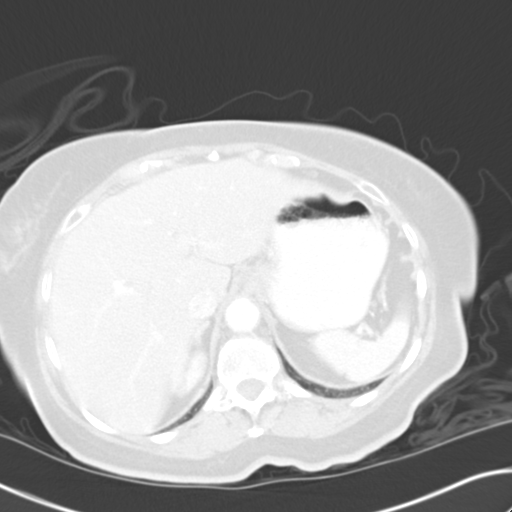
[im 78/87  lung]
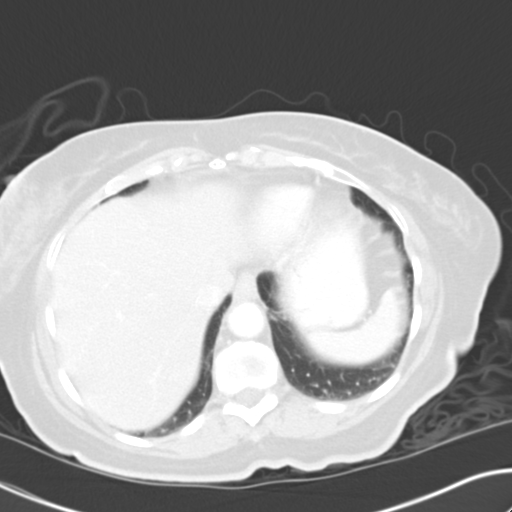
[im 82/87  soft-tissue]
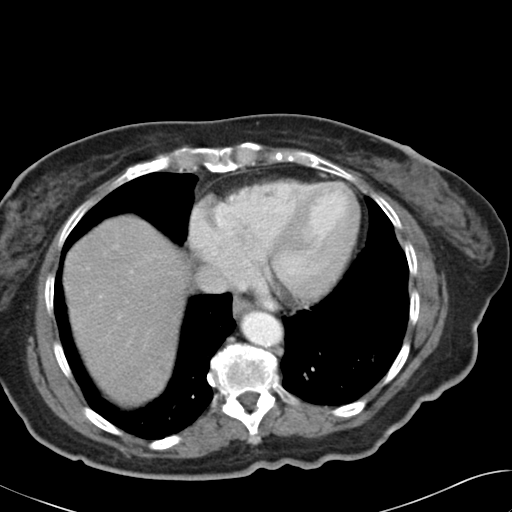
[im 82/87  lung]
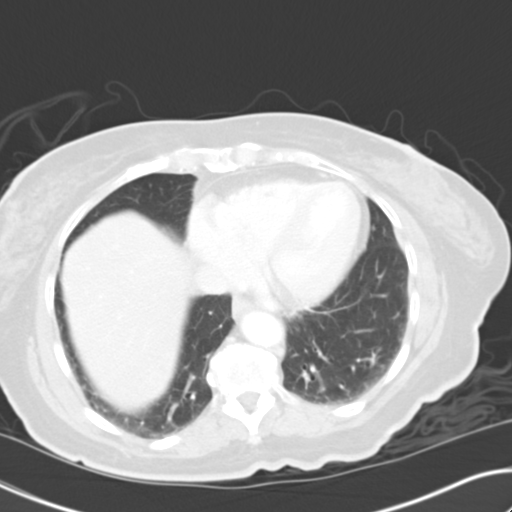

[14 of 32 positions shown; findings below may reference images not displayed]

FINDINGS: The visualized lung bases are clear.

Hepatic hypodensities measure up to 1.9 cm in size, at the inferior
tip of the liver and adjacent to the gallbladder fossa. The spleen
is unremarkable in appearance. The gallbladder is within normal
limits. The pancreas and adrenal glands are unremarkable.

The kidneys are unremarkable in appearance. There is no evidence of
hydronephrosis. No renal or ureteral stones are seen. No perinephric
stranding is appreciated.

No free fluid is identified. The small bowel is unremarkable in
appearance. The stomach is within normal limits. No acute vascular
abnormalities are seen. Scattered calcification is noted along the
abdominal aorta and its branches.

The appendix is not well characterized; there is no evidence for
appendicitis. Scattered diverticulosis is noted along the sigmoid
colon, without evidence of diverticulitis.

The bladder is moderately distended and grossly unremarkable. The
patient is status post hysterectomy. No suspicious adnexal masses
are seen. No inguinal lymphadenopathy is seen.

No acute osseous abnormalities are identified. There is fusion of
the posterior elements at L4-L5. There is mild grade 1
anterolisthesis of L4 on L5.
IMPRESSION: 1. No acute abnormality seen to explain the patient's symptoms.
2. Scattered diverticulosis along the sigmoid colon, without
evidence of diverticulitis.
3. Nonspecific hypodensities within the liver, measuring up to
cm.
4. Scattered calcification along the abdominal aorta and its
branches.

## 2017-10-17 ENCOUNTER — Non-Acute Institutional Stay (SKILLED_NURSING_FACILITY): Payer: Medicare Other | Admitting: Gerontology

## 2017-10-17 ENCOUNTER — Encounter: Payer: Self-pay | Admitting: Gerontology

## 2017-10-17 DIAGNOSIS — E876 Hypokalemia: Secondary | ICD-10-CM | POA: Diagnosis not present

## 2017-10-17 DIAGNOSIS — M48062 Spinal stenosis, lumbar region with neurogenic claudication: Secondary | ICD-10-CM

## 2017-10-17 DIAGNOSIS — Z9181 History of falling: Secondary | ICD-10-CM | POA: Diagnosis not present

## 2017-10-19 ENCOUNTER — Encounter
Admission: RE | Admit: 2017-10-19 | Discharge: 2017-10-19 | Disposition: A | Discharge status: Home / Self Care | Payer: Medicare Other | Source: Ambulatory Visit | Attending: Internal Medicine | Admitting: Internal Medicine

## 2017-10-23 NOTE — Assessment & Plan Note (Signed)
Stable. Pt remains on Potassium Chloride 20 meq daily. Recent labs show K+ WNL.

## 2017-10-23 NOTE — Assessment & Plan Note (Signed)
No falls recently. Pt is bedbound/ unable to ambulate. Verbalizes she wants to walk, but refuses to get OOB to chair.

## 2017-10-23 NOTE — Assessment & Plan Note (Signed)
Stable. No recent complaints of worsening pain or symptoms. Symptoms controlled with prn tylenol, Neurontin, Lidocaine patch and prn Percocet. Pt is bedbound. Unable to ambulate.

## 2017-10-23 NOTE — Progress Notes (Signed)
Location:    Nursing Home Room Number: 303A Place of Service:  SNF (31) Provider:  Toni Arthurs, NP-C  Juluis Pitch, MD  Patient Care Team: Juluis Pitch, MD as PCP - General [facility])  Extended Emergency Contact Information Primary Emergency Contact: [facility] Address: [address]          [place]          [place], [place] [address] Johnnette Litter of [place] Phone: [phone] Relation: Friend Secondary Emergency Contact: Francena Hanly [place] Phone: [phone] Relation: Friend  Code Status:  DNR Goals of care: Advanced Directive information Advanced Directives 10/17/2017  Does Patient Have a Medical Advance Directive? Yes  Type of Advance Directive Out of facility DNR (pink MOST or yellow form)  Does patient want to make changes to medical advance directive? No - Patient declined  Copy of [place] in Chart? -  Would patient like information on creating a medical advance directive? -  Pre-existing out of facility DNR order (yellow form or pink MOST form) Yellow form placed in chart (order not valid for inpatient use)     Chief Complaint  Patient presents with  . Medical Management of Chronic Issues    Routine Visit    HPI:  Pt is a 84 y.o. female seen today for medical management of chronic diseases.    Lumbar stenosis with neurogenic claudication Stable. No recent complaints of worsening pain or symptoms. Symptoms controlled with prn tylenol, Neurontin, Lidocaine patch and prn Percocet. Pt is bedbound. Unable to ambulate.   Hypokalemia Stable. Pt remains on Potassium Chloride 20 meq daily. Recent labs show K+ WNL.   Hx of falling No falls recently. Pt is bedbound/ unable to ambulate. Verbalizes she wants to walk, but refuses to get OOB to chair.   Please note pt with limited Cognition/ Dementia. Unable to obtain complete ROS. Some ROS info obtained from staff and documentation.   Past Medical  History:  Diagnosis Date  . Arthritis   . Back pain   . Cancer ([place])    Lung  . Diabetes mellitus without complication ([place])   . Diabetes mellitus, type 2 ([place])   . Hypertension   . Lumbar stenosis   . Osteoarthritis    Past Surgical History:  Procedure Laterality Date  . ABDOMINAL HYSTERECTOMY    . FINE NEEDLE ASPIRATION Left 09/06/2016   Procedure: FINE NEEDLE ASPIRATION; WITH IMAGING GUIDANCE; Surgeon: Marchia Bond, MD; Location: MAIN OR [facility]; Service: Pulmonary  . LAMINOTOMY  10/08/2015  . LUMBAR LAMINECTOMY WITH COFLEX 2 LEVEL Bilateral 09/23/2015   Procedure: Laminectomy and Foraminotomy L4-L5 bilateral with CoFlex L5-S1 - left;  Surgeon: Karie Chimera, MD;  Location: [facility]URO ORS;  Service: Neurosurgery;  Laterality: Bilateral;  . WOUND EXPLORATION N/A 09/27/2015   Procedure: WOUND EXPLORATION;  Surgeon: Kevan Ny Ditty, MD;  Location: [facility] NEURO ORS;  Service: Neurosurgery;  Laterality: N/A;  WOUND EXPLORATION    Allergies  Allergen Reactions  . Tramadol Nausea Only and Nausea And Vomiting    Allergies as of 10/17/2017      Reactions   Tramadol Nausea Only, Nausea And Vomiting      Medication List        Accurate as of 10/17/17 11:59 PM. Always use your most recent med list.          acetaminophen 325 MG tablet Commonly known as:  TYLENOL Take 650 mg by mouth every 6 (six) hours as needed.  alum & mag hydroxide-simeth 469-629-52 MG/5ML suspension Commonly known as:  MAALOX PLUS Take 30 mLs by mouth every 4 (four) hours as needed for indigestion.   amLODipine 10 MG tablet Commonly known as:  NORVASC Take 10 mg by mouth daily.   ASPERCREME LIDOCAINE 4 % Ptch Generic drug:  Lidocaine Apply 1 patch topically daily. Apply patch to area of pain on the back. Remove after 12 hours.   benzonatate 100 MG capsule Commonly known as:  TESSALON Take 100 mg by mouth 3 (three) times daily as needed for cough.   carvedilol 25 MG tablet Commonly known as:   COREG Take 1 tablet (25 mg total) by mouth 2 (two) times daily with a meal.   Cholecalciferol 2000 units Caps Take 1 capsule by mouth daily.   divalproex 125 MG DR tablet Commonly known as:  DEPAKOTE Take 250 mg by mouth 2 (two) times daily.   FLUoxetine 40 MG capsule Commonly known as:  PROZAC Take 40 mg by mouth daily.   gabapentin 400 MG capsule Commonly known as:  NEURONTIN Take 400 mg by mouth every 8 (eight) hours.   GLUCERNA Liqd Take 237 mLs by mouth 2 (two) times daily.   guaiFENesin 100 MG/5ML liquid Commonly known as:  ROBITUSSIN Take 200 mg by mouth every 4 (four) hours as needed for cough. Notify the provider if cough worsens/ continues longer than 3 days   lisinopril 10 MG tablet Commonly known as:  PRINIVIL,ZESTRIL Take 10 mg by mouth daily.   LOTRISONE cream Generic drug:  clotrimazole-betamethasone Apply 1 application topically 2 (two) times daily. Apply to left anterior forearm   magnesium hydroxide 400 MG/5ML suspension Commonly known as:  MILK OF MAGNESIA Take 30 mLs by mouth every 4 (four) hours as needed for mild constipation. If Constipation/ no BM for 2 days   Menthol 2.7 MG Lozg Use as directed 1 lozenge in the mouth or throat every 2 (two) hours as needed.   mirtazapine 15 MG tablet Commonly known as:  REMERON Take 15 mg by mouth at bedtime.   ondansetron 4 MG tablet Commonly known as:  ZOFRAN Take 4 mg by mouth every 6 (six) hours as needed for nausea or vomiting.   oxyCODONE-acetaminophen 5-325 MG tablet Commonly known as:  ROXICET Take 1 tablet by mouth every 4 (four) hours as needed for severe pain.   polyethylene glycol powder powder Commonly known as:  GLYCOLAX/MIRALAX Take 17 g by mouth daily.   potassium chloride SA 20 MEQ tablet Commonly known as:  KLOR-CON M20 Take 1 tablet (20 mEq total) by mouth daily.   pravastatin 10 MG tablet Commonly known as:  PRAVACHOL Take 10 mg by mouth once a week. On sunday    sennosides-docusate sodium 8.6-50 MG tablet Commonly known as:  SENOKOT-S Take 2 tablets by mouth as needed.   white petrolatum ointment Apply topically to dry skin/heels as needed. May keep at bedside       Review of Systems  Unable to perform ROS: Dementia  Constitutional: Negative for activity change, appetite change, chills, diaphoresis and fever.  HENT: Negative for congestion, mouth sores, nosebleeds, postnasal drip, sneezing, sore throat, trouble swallowing and voice change.   Respiratory: Negative for apnea, cough, choking, chest tightness, shortness of breath and wheezing.   Cardiovascular: Negative for chest pain, palpitations and leg swelling.  Gastrointestinal: Negative for abdominal distention, abdominal pain, constipation, diarrhea and nausea.  Genitourinary: Negative for difficulty urinating, dysuria, frequency and urgency.  Musculoskeletal: Positive for arthralgias (  typical arthritis), back pain and gait problem. Negative for myalgias.  Skin: Negative for color change, pallor, rash and wound.  Neurological: Positive for weakness. Negative for dizziness, tremors, syncope, speech difficulty, numbness and headaches.  Psychiatric/Behavioral: Negative for agitation and behavioral problems.  All other systems reviewed and are negative.   Immunization History  Administered Date(s) Administered  . PPD Test 01/09/2017   Pertinent  Health Maintenance Due  Topic Date Due  . FOOT EXAM  04/29/1944  . OPHTHALMOLOGY EXAM  04/29/1944  . DEXA SCAN  04/29/2001  . PNA vac Low Risk Adult (1 of 2 - PCV13) 04/29/2001  . INFLUENZA VACCINE  07/07/18 (Originally 04/18/2017)  . HEMOGLOBIN A1C  01/21/2018   No flowsheet data found. Functional Status Survey:    Vitals:   10/17/17 1203  BP: 110/70  Pulse: 63  Resp: 20  Temp: 97.6 F (36.4 C)  TempSrc: Oral  SpO2: 90%  Weight: 122 lb 14.4 oz (55.7 kg)  Height: 5\' 2"  (1.575 m)   Body mass index is 22.48 kg/m. Physical Exam   Constitutional: Vital signs are normal. She appears well-developed and well-nourished. She is active and cooperative. She does not appear ill. No distress.  HENT:  Head: Normocephalic and atraumatic.  Mouth/Throat: Uvula is midline, oropharynx is clear and moist and mucous membranes are normal. Mucous membranes are not pale, not dry and not cyanotic.  Eyes: Conjunctivae, EOM and lids are normal. Pupils are equal, round, and reactive to light.  Neck: Trachea normal, normal range of motion and full passive range of motion without pain. Neck supple. No JVD present. No tracheal deviation, no edema and no erythema present. No thyromegaly present.  Cardiovascular: Normal rate, regular rhythm, normal heart sounds, intact distal pulses and normal pulses. Exam reveals no gallop, no distant heart sounds and no friction rub.  No murmur heard. Pulses:      Dorsalis pedis pulses are 2+ on the right side, and 2+ on the left side.  No edema  Pulmonary/Chest: Effort normal and breath sounds normal. No accessory muscle usage. No respiratory distress. She has no decreased breath sounds. She has no wheezes. She has no rhonchi. She has no rales. She exhibits no tenderness.  Abdominal: Soft. Normal appearance and bowel sounds are normal. She exhibits no distension and no ascites. There is no tenderness.  Musculoskeletal: Normal range of motion. She exhibits no edema or tenderness.  Expected osteoarthritis, stiffness; Bilateral Calves soft, supple. Negative Homan's Sign. B- pedal pulses equal; generalized weakness, bedbound  Neurological: She is alert. She has normal strength. She displays atrophy. A sensory deficit is present. She exhibits abnormal muscle tone. Coordination and gait abnormal.  B- foot drop  Skin: Skin is warm, dry and intact. She is not diaphoretic. No cyanosis. No pallor. Nails show no clubbing.  Psychiatric: Her speech is normal. Judgment normal. Her mood appears anxious. Her affect is labile. She  is agitated (at times) and withdrawn. Thought content is paranoid (at times) and delusional. Cognition and memory are impaired. She exhibits abnormal recent memory and abnormal remote memory.  Nursing note and vitals reviewed.   Labs reviewed: Recent Labs    05/15/17 0525 07/24/17 0620 08/21/17 0606  NA 138 138 139  K 3.9 4.0 4.1  CL 102 99* 104  CO2 28 30 27   GLUCOSE 221* 168* 151*  BUN 14 11 12   CREATININE 0.49 0.44 0.40*  CALCIUM 9.7 9.7 9.2  MG  --  1.9 1.7   Recent Labs  01/07/17 1421 07/24/17 0620 08/21/17 0606  AST 22 17 16   ALT 15 12* 9*  ALKPHOS 102 96 81  BILITOT 0.5 0.7 0.3  PROT 8.3* 7.1 6.5  ALBUMIN 3.9 3.4* 3.0*   Recent Labs    01/07/17 1421 01/08/17 0622 07/24/17 0620 08/21/17 0606  WBC 6.5 5.8 6.1 5.6  NEUTROABS 3.7  --  2.9 2.5  HGB 13.0 11.5* 13.5 13.1  HCT 38.1 33.8* 39.7 37.8  MCV 82.0 82.6 83.3 85.1  PLT 393 356 300 239   Lab Results  Component Value Date   TSH 1.070 08/21/2017   Lab Results  Component Value Date   HGBA1C 8.9 (H) 07/24/2017   No results found for: CHOL, HDL, LDLCALC, LDLDIRECT, TRIG, CHOLHDL  Significant Diagnostic Results in last 30 days:  No results found.  Assessment/Plan Jazzy was seen today for medical management of chronic issues.  Diagnoses and all orders for this visit:  Lumbar stenosis with neurogenic claudication  Hypokalemia  Hx of falling   above listed conditions stable  Continue current medication regimen  Continue to encourage pt to get OOB to chair daily  Continue to encourage interaction with other residents and activities  Monitor labs for K+ stability  Restorative/ROM exercises as tolerates  Family/ staff Communication:   Total Time:  Documentation:  Face to Face:  Family/Phone:   Labs/tests ordered: not due  Medication list reviewed and assessed for continued appropriateness. Monthly medication orders reviewed and signed.  Vikki Ports,  NP-C [facility] [phone], [place] [address] Cell Phone (Mon-Fri 8am-5pm):  [phone] On Call:  [phone] & follow prompts after 5pm & weekends Office Phone:  [phone] Office Fax:  [phone]

## 2017-11-14 ENCOUNTER — Non-Acute Institutional Stay (SKILLED_NURSING_FACILITY): Payer: Medicare Other | Admitting: Gerontology

## 2017-11-14 ENCOUNTER — Encounter: Payer: Self-pay | Admitting: Gerontology

## 2017-11-14 DIAGNOSIS — T148XXA Other injury of unspecified body region, initial encounter: Secondary | ICD-10-CM

## 2017-11-14 DIAGNOSIS — I10 Essential (primary) hypertension: Secondary | ICD-10-CM | POA: Diagnosis not present

## 2017-11-14 DIAGNOSIS — C3492 Malignant neoplasm of unspecified part of left bronchus or lung: Secondary | ICD-10-CM

## 2017-11-16 ENCOUNTER — Encounter
Admission: RE | Admit: 2017-11-16 | Discharge: 2017-11-16 | Disposition: A | Discharge status: Home / Self Care | Payer: Medicare Other | Source: Ambulatory Visit | Attending: Internal Medicine | Admitting: Internal Medicine

## 2017-12-14 ENCOUNTER — Non-Acute Institutional Stay (SKILLED_NURSING_FACILITY): Payer: Medicare Other | Admitting: Gerontology

## 2017-12-14 DIAGNOSIS — R51 Headache: Secondary | ICD-10-CM | POA: Diagnosis not present

## 2017-12-14 DIAGNOSIS — F4323 Adjustment disorder with mixed anxiety and depressed mood: Secondary | ICD-10-CM

## 2017-12-14 DIAGNOSIS — K5900 Constipation, unspecified: Secondary | ICD-10-CM | POA: Diagnosis not present

## 2017-12-14 DIAGNOSIS — R519 Headache, unspecified: Secondary | ICD-10-CM

## 2017-12-14 NOTE — Assessment & Plan Note (Addendum)
Stable. Pt does complain of intermittent constipation. She reports she has not had a BM in 5 days, but documentation shows it's been 2 days. She is feeling uncomfortable in the RLQ. Abdomen palpated, felt as if there was a large amount of stool in the intestines. Will increase bowel regimen. Normoactive bowel sounds. Abdomen was "uncomfortable", but not acutely painful. Currently not on anything scheduled for constipation.

## 2017-12-14 NOTE — Progress Notes (Signed)
Location:      Place of Service:  SNF (31) Provider:  Toni Arthurs, NP-C  Juluis Pitch, MD  Patient Care Team: Juluis Pitch, MD as PCP - General [facility] Medicine)  Extended Emergency Contact Information Primary Emergency Contact: [facility] Address: [address]          [place]          [place], [place] [address] Johnnette Litter of [place] Phone: [phone] Relation: Friend Secondary Emergency Contact: Francena Hanly [place] of [place] Phone: [phone] Relation: Friend  Code Status:  DNR Goals of care: Advanced Directive information Advanced Directives 11/14/2017  Does Patient Have a Medical Advance Directive? Yes  Type of Advance Directive Out of facility DNR (pink MOST or yellow form)  Does patient want to make changes to medical advance directive? No - Patient declined  Copy of [place] in Chart? -  Would patient like information on creating a medical advance directive? -  Pre-existing out of facility DNR order (yellow form or pink MOST form) Yellow form placed in chart (order not valid for inpatient use)     Chief Complaint  Patient presents with  . Medical Management of Chronic Issues    HPI:  Pt is a 84 y.o. female seen today for medical management of chronic diseases.    Constipation Stable. Pt does complain of intermittent constipation. She reports she has not had a BM in 5 days, but documentation shows it's been 2 days. She is feeling uncomfortable in the RLQ. Abdomen palpated, felt as if there was a large amount of stool in the intestines. Will increase bowel regimen. Normoactive bowel sounds. Abdomen was "uncomfortable", but not acutely painful. Currently not on anything scheduled for constipation.   Adjustment disorder with mixed anxiety and depressed mood Variable. Pt reports the depression has been worsening as of late. More tearful, more withdrawn. Appetite decreased. Adjustment made to prozac recently. No  change in severity of symptoms yet. Will make further adjustments. Currently on Przac 50 mg po Q Day and Depakote 250 mg po BID.   Chronic headaches Controlled. No complaint of worsening symptoms of headaches as of late. No changes in vision, no dizziness, no nausea, etc.     Past Medical History:  Diagnosis Date  . Arthritis   . Back pain   . Cancer ([place])    Lung  . Diabetes mellitus without complication ([place])   . Diabetes mellitus, type 2 ([place])   . Hypertension   . Lumbar stenosis   . Osteoarthritis    Past Surgical History:  Procedure Laterality Date  . ABDOMINAL HYSTERECTOMY    . FINE NEEDLE ASPIRATION Left 09/06/2016   Procedure: FINE NEEDLE ASPIRATION; WITH IMAGING GUIDANCE; Surgeon: Marchia Bond, MD; Location: MAIN OR [facility]; Service: Pulmonary  . LAMINOTOMY  10/08/2015  . LUMBAR LAMINECTOMY WITH COFLEX 2 LEVEL Bilateral 09/23/2015   Procedure: Laminectomy and Foraminotomy L4-L5 bilateral with CoFlex L5-S1 - left;  Surgeon: Karie Chimera, MD;  Location: [place] [facility]S;  Service: Neurosurgery;  Laterality: Bilateral;  . WOUND EXPLORATION N/A 09/27/2015   Procedure: WOUND EXPLORATION;  Surgeon: Kevan Ny Ditty, MD;  Location: [place] ORS;  Service: Neurosurgery;  Laterality: N/A;  WOUND EXPLORATION    Allergies  Allergen Reactions  . Tramadol Nausea Only and Nausea And Vomiting    Allergies as of 12/14/2017      Reactions   Tramadol Nausea Only, Nausea And Vomiting      Medication List  Accurate as of 12/14/17  1:03 PM. Always use your most recent med list.          acetaminophen 325 MG tablet Commonly known as:  TYLENOL Take 650 mg by mouth every 6 (six) hours as needed. pain/fever - do not exceed 3000mg  in 24 hr period   alum & mag hydroxide-simeth 400-400-40 MG/5ML suspension Commonly known as:  MAALOX PLUS Take 30 mLs by mouth every 4 (four) hours as needed for indigestion.   amLODipine 10 MG tablet Commonly known as:  NORVASC Take 10 mg by  mouth daily.   ASPERCREME LIDOCAINE 4 % Ptch Generic drug:  Lidocaine Apply 1 patch topically daily. Apply patch to area of pain on the back. Remove after 12 hours.   benzonatate 100 MG capsule Commonly known as:  TESSALON Take 100 mg by mouth 3 (three) times daily as needed for cough.   carvedilol 25 MG tablet Commonly known as:  COREG Take 1 tablet (25 mg total) by mouth 2 (two) times daily with a meal.   Cholecalciferol 2000 units Caps Take 1 capsule by mouth daily.   divalproex 125 MG DR tablet Commonly known as:  DEPAKOTE Take 250 mg by mouth 2 (two) times daily.   FLUoxetine 40 MG capsule Commonly known as:  PROZAC Take 40 mg by mouth daily.   gabapentin 400 MG capsule Commonly known as:  NEURONTIN Take 400 mg by mouth every 8 (eight) hours.   GLUCERNA Liqd Take 237 mLs by mouth 2 (two) times daily.   guaiFENesin 100 MG/5ML liquid Commonly known as:  ROBITUSSIN Take 200 mg by mouth every 4 (four) hours as needed for cough. Notify the provider if cough worsens/ continues longer than 3 days   lisinopril 10 MG tablet Commonly known as:  PRINIVIL,ZESTRIL Take 10 mg by mouth daily.   magnesium hydroxide 400 MG/5ML suspension Commonly known as:  MILK OF MAGNESIA Take 30 mLs by mouth every 4 (four) hours as needed for mild constipation. If Constipation/ no BM for 2 days   Menthol 2.7 MG Lozg Use as directed 1 lozenge in the mouth or throat every 2 (two) hours as needed.   mirtazapine 15 MG tablet Commonly known as:  REMERON Take 15 mg by mouth at bedtime.   ondansetron 4 MG tablet Commonly known as:  ZOFRAN Take 4 mg by mouth every 6 (six) hours as needed for nausea or vomiting.   oxyCODONE-acetaminophen 5-325 MG tablet Commonly known as:  ROXICET Take 1 tablet by mouth every 4 (four) hours as needed for severe pain.   polyethylene glycol powder powder Commonly known as:  GLYCOLAX/MIRALAX Take 17 g by mouth daily.   potassium chloride SA 20 MEQ  tablet Commonly known as:  KLOR-CON M20 Take 1 tablet (20 mEq total) by mouth daily.   pravastatin 10 MG tablet Commonly known as:  PRAVACHOL Take 10 mg by mouth once a week. On sunday   sennosides-docusate sodium 8.6-50 MG tablet Commonly known as:  SENOKOT-S Take 2 tablets by mouth as needed.   white petrolatum ointment Apply topically to dry skin/heels as needed. May keep at bedside       Review of Systems  Constitutional: Positive for fatigue. Negative for activity change, appetite change, chills, diaphoresis and fever.  HENT: Negative for congestion, mouth sores, nosebleeds, postnasal drip, sneezing, sore throat, trouble swallowing and voice change.   Respiratory: Negative for apnea, cough, choking, chest tightness, shortness of breath and wheezing.   Cardiovascular: Negative for chest pain,  palpitations and leg swelling.  Gastrointestinal: Positive for abdominal pain and constipation. Negative for abdominal distention, diarrhea and nausea.  Genitourinary: Negative for difficulty urinating, dysuria, frequency and urgency.  Musculoskeletal: Positive for arthralgias (typical arthritis), back pain and gait problem. Negative for myalgias.  Skin: Negative for color change, pallor, rash and wound.  Neurological: Positive for weakness. Negative for dizziness, tremors, syncope, speech difficulty, numbness and headaches.  Psychiatric/Behavioral: Positive for decreased concentration and dysphoric mood. Negative for agitation and behavioral problems.  All other systems reviewed and are negative.   Immunization History  Administered Date(s) Administered  . PPD Test 01/09/2017   Pertinent  Health Maintenance Due  Topic Date Due  . FOOT EXAM  04/29/1944  . OPHTHALMOLOGY EXAM  04/29/1944  . DEXA SCAN  04/29/2001  . PNA vac Low Risk Adult (1 of 2 - PCV13) 04/29/2001  . INFLUENZA VACCINE  2018/06/21 (Originally 04/18/2017)  . HEMOGLOBIN A1C  01/21/2018   No flowsheet data  found. Functional Status Survey:    Vitals:   12/12/17 1400  BP: (!) 97/47  Pulse: 69  Resp: 14  Temp: (!) 97.4 F (36.3 C)  SpO2: 96%   There is no height or weight on file to calculate BMI. Physical Exam  Constitutional: She is oriented to person, place, and time. Vital signs are normal. She appears well-developed and well-nourished. She is active and cooperative. She does not appear ill. No distress.  HENT:  Head: Normocephalic and atraumatic.  Mouth/Throat: Uvula is midline, oropharynx is clear and moist and mucous membranes are normal. Mucous membranes are not pale, not dry and not cyanotic.  Eyes: Pupils are equal, round, and reactive to light. Conjunctivae, EOM and lids are normal.  Neck: Trachea normal, normal range of motion and full passive range of motion without pain. Neck supple. No JVD present. No tracheal deviation, no edema and no erythema present. No thyromegaly present.  Cardiovascular: Normal rate, regular rhythm, normal heart sounds, intact distal pulses and normal pulses. Exam reveals no gallop, no distant heart sounds and no friction rub.  No murmur heard. Pulses:      Dorsalis pedis pulses are 2+ on the right side, and 2+ on the left side.  No edema  Pulmonary/Chest: Effort normal and breath sounds normal. No accessory muscle usage. No respiratory distress. She has no decreased breath sounds. She has no wheezes. She has no rhonchi. She has no rales. She exhibits no tenderness.  Abdominal: Soft. Normal appearance and bowel sounds are normal. She exhibits no distension and no ascites. There is no hepatosplenomegaly. There is tenderness in the right lower quadrant. There is no rebound and no CVA tenderness.  Musculoskeletal: Normal range of motion. She exhibits no edema or tenderness.  Expected osteoarthritis, stiffness; Bilateral Calves soft, supple. Negative Homan's Sign. B- pedal pulses equal; generalized weakness, immobile, bedbound  Neurological: She is alert  and oriented to person, place, and time. She has normal strength. She displays atrophy. A cranial nerve deficit and sensory deficit is present. She exhibits abnormal muscle tone. Coordination and gait abnormal.  Skin: Skin is warm, dry and intact. She is not diaphoretic. No cyanosis. No pallor. Nails show no clubbing.  Psychiatric: Her speech is normal. Judgment and thought content normal. Her affect is labile. She is withdrawn. Cognition and memory are impaired. She exhibits a depressed mood. She exhibits abnormal recent memory.  Nursing note and vitals reviewed.   Labs reviewed: Recent Labs    05/15/17 0525 07/24/17 0620 08/21/17 0606  NA  138 138 139  K 3.9 4.0 4.1  CL 102 99* 104  CO2 28 30 27   GLUCOSE 221* 168* 151*  BUN 14 11 12   CREATININE 0.49 0.44 0.40*  CALCIUM 9.7 9.7 9.2  MG  --  1.9 1.7   Recent Labs    01/07/17 1421 07/24/17 0620 08/21/17 0606  AST 22 17 16   ALT 15 12* 9*  ALKPHOS 102 96 81  BILITOT 0.5 0.7 0.3  PROT 8.3* 7.1 6.5  ALBUMIN 3.9 3.4* 3.0*   Recent Labs    01/07/17 1421 01/08/17 0622 07/24/17 0620 08/21/17 0606  WBC 6.5 5.8 6.1 5.6  NEUTROABS 3.7  --  2.9 2.5  HGB 13.0 11.5* 13.5 13.1  HCT 38.1 33.8* 39.7 37.8  MCV 82.0 82.6 83.3 85.1  PLT 393 356 300 239   Lab Results  Component Value Date   TSH 1.070 08/21/2017   Lab Results  Component Value Date   HGBA1C 8.9 (H) 07/24/2017   No results found for: CHOL, HDL, LDLCALC, LDLDIRECT, TRIG, CHOLHDL  Significant Diagnostic Results in last 30 days:  No results found.  Assessment/Plan Annamay was seen today for medical management of chronic issues.  Diagnoses and all orders for this visit:  Chronic intractable headache, unspecified headache type  Constipation, unspecified constipation type  Adjustment disorder with mixed anxiety and depressed mood   Headaches stable, Constipation and Adjustment d/o variable  Continue current medication regimen with the following  changes:  Increase Depakote to 250 mg po TID  Add Miralax 17 grams po Q Day  Give Senna S- 2 tablets po BID until BM  Continue Prozac 50 mg po Q Day  Continue to encourage pt to participate in activities and interact with other residents  OOB to chair as tolerated  Continue to be seen by Palliative Care   Family/ staff Communication:   Total Time:  Documentation:  Face to Face:  Family/Phone:   Labs/tests ordered:  Not due  Medication list reviewed and assessed for continued appropriateness. Monthly medication orders reviewed and signed.  Vikki Ports, NP-C Geriatrics [facility] [phone], [place] [address] Cell Phone (Mon-Fri 8am-5pm):  [phone] On Call:  [phone] & follow prompts after 5pm & weekends Office Phone:  [phone] Office Fax:  [phone]

## 2017-12-14 NOTE — Assessment & Plan Note (Addendum)
Variable. Pt reports the depression has been worsening as of late. More tearful, more withdrawn. Appetite decreased. Adjustment made to prozac recently. No change in severity of symptoms yet. Will make further adjustments. Currently on Przac 50 mg po Q Day and Depakote 250 mg po BID.

## 2017-12-14 NOTE — Assessment & Plan Note (Signed)
Controlled. No complaint of worsening symptoms of headaches as of late. No changes in vision, no dizziness, no nausea, etc.

## 2017-12-17 ENCOUNTER — Encounter
Admission: RE | Admit: 2017-12-17 | Discharge: 2017-12-17 | Disposition: A | Discharge status: Home / Self Care | Payer: Medicare Other | Source: Ambulatory Visit | Attending: Internal Medicine | Admitting: Internal Medicine

## 2017-12-28 NOTE — Assessment & Plan Note (Signed)
Resolved. Staff continues to use Skin prep on the heels BID and elevate heels on a pillow. No further skin breakdown noted.

## 2017-12-28 NOTE — Assessment & Plan Note (Signed)
Stable. No recent episodes of hyper or hypotension. Pt denies chest pains or shortness of breath. SX managed with Lisinopril 10 mg po Q Day, Norvasc 10 mg po Q Day, Coreg 25 mg po BID. No edema.

## 2017-12-28 NOTE — Progress Notes (Signed)
Location:    Nursing Home Room Number: 303A Place of Service:  SNF (31) Provider:  Toni Arthurs, NP-C  Juluis Pitch, MD  Patient Care Team: Juluis Pitch, MD as PCP - General [facility] Medicine)  Extended Emergency Contact Information Primary Emergency Contact: [facility] Address: [address]          [place]          [place], [place] [address] Johnnette Litter of [place] Phone: [phone] Relation: Friend Secondary Emergency Contact: Francena Hanly [place] of [place] Phone: [phone] Relation: Friend  Code Status:  DNR Goals of care: Advanced Directive information Advanced Directives 11/14/2017  Does Patient Have a Medical Advance Directive? Yes  Type of Advance Directive Out of facility DNR (pink MOST or yellow form)  Does patient want to make changes to medical advance directive? No - Patient declined  Copy of [place] in Chart? -  Would patient like information on creating a medical advance directive? -  Pre-existing out of facility DNR order (yellow form or pink MOST form) Yellow form placed in chart (order not valid for inpatient use)     Chief Complaint  Patient presents with  . Medical Management of Chronic Issues    Routine Visit    HPI:  Pt is a 84 y.o. female seen today for medical management of chronic diseases.    Deep tissue injury Resolved. Staff continues to use Skin prep on the heels BID and elevate heels on a pillow. No further skin breakdown noted.  Essential (primary) hypertension Stable. No recent episodes of hyper or hypotension. Pt denies chest pains or shortness of breath. SX managed with Lisinopril 10 mg po Q Day, Norvasc 10 mg po Q Day, Coreg 25 mg po BID. No edema.   Metastatic primary lung cancer, left (HCC) Stable, but presumed slowly progressive. No further work up, no treatment. Being followed by Palliative Care.     Past Medical History:  Diagnosis Date  . Arthritis   . Back pain     . Cancer ([place])    Lung  . Diabetes mellitus without complication ([place])   . Diabetes mellitus, type 2 ([place])   . Hypertension   . Lumbar stenosis   . Osteoarthritis    Past Surgical History:  Procedure Laterality Date  . ABDOMINAL HYSTERECTOMY    . FINE NEEDLE ASPIRATION Left 09/06/2016   Procedure: FINE NEEDLE ASPIRATION; WITH IMAGING GUIDANCE; Surgeon: Marchia Bond, MD; Location: [facility]IN [facility]; Service: Pulmonary  . LAMINOTOMY  10/08/2015  . LUMBAR LAMINECTOMY WITH COFLEX 2 LEVEL Bilateral 09/23/2015   Procedure: Laminectomy and Foraminotomy L4-L5 bilateral with CoFlex L5-S1 - left;  Surgeon: Karie Chimera, MD;  Location: [place] NEURO ORS;  Service: Neurosurgery;  Laterality: Bilateral;  . WOUND EXPLORATION N/A 09/27/2015   Procedure: WOUND EXPLORATION;  Surgeon: Kevan Ny Ditty, MD;  Location: [place] NEURO ORS;  Service: Neurosurgery;  Laterality: N/A;  WOUND EXPLORATION    Allergies  Allergen Reactions  . Tramadol Nausea Only and Nausea And Vomiting    Allergies as of 11/14/2017      Reactions   Tramadol Nausea Only, Nausea And Vomiting      Medication List        Accurate as of 11/14/17 11:59 PM. Always use your most recent med list.          acetaminophen 325 MG tablet Commonly known as:  TYLENOL Take 650 mg by mouth every 6 (six) hours as needed. pain/fever -  do not exceed 3000mg  in 24 hr period   alum & mag hydroxide-simeth 400-400-40 MG/5ML suspension Commonly known as:  MAALOX PLUS Take 30 mLs by mouth every 4 (four) hours as needed for indigestion.   amLODipine 10 MG tablet Commonly known as:  NORVASC Take 10 mg by mouth daily.   ASPERCREME LIDOCAINE 4 % Ptch Generic drug:  Lidocaine Apply 1 patch topically daily. Apply patch to area of pain on the back. Remove after 12 hours.   benzonatate 100 MG capsule Commonly known as:  TESSALON Take 100 mg by mouth 3 (three) times daily as needed for cough.   carvedilol 25 MG tablet Commonly known as:  COREG Take  1 tablet (25 mg total) by mouth 2 (two) times daily with a meal.   Cholecalciferol 2000 units Caps Take 1 capsule by mouth daily.   divalproex 125 MG DR tablet Commonly known as:  DEPAKOTE Take 250 mg by mouth 2 (two) times daily.   FLUoxetine 40 MG capsule Commonly known as:  PROZAC Take 40 mg by mouth daily.   gabapentin 400 MG capsule Commonly known as:  NEURONTIN Take 400 mg by mouth every 8 (eight) hours.   GLUCERNA Liqd Take 237 mLs by mouth 2 (two) times daily.   guaiFENesin 100 MG/5ML liquid Commonly known as:  ROBITUSSIN Take 200 mg by mouth every 4 (four) hours as needed for cough. Notify the provider if cough worsens/ continues longer than 3 days   lisinopril 10 MG tablet Commonly known as:  PRINIVIL,ZESTRIL Take 10 mg by mouth daily.   magnesium hydroxide 400 MG/5ML suspension Commonly known as:  MILK OF MAGNESIA Take 30 mLs by mouth every 4 (four) hours as needed for mild constipation. If Constipation/ no BM for 2 days   Menthol 2.7 MG Lozg Use as directed 1 lozenge in the mouth or throat every 2 (two) hours as needed.   mirtazapine 15 MG tablet Commonly known as:  REMERON Take 15 mg by mouth at bedtime.   ondansetron 4 MG tablet Commonly known as:  ZOFRAN Take 4 mg by mouth every 6 (six) hours as needed for nausea or vomiting.   oxyCODONE-acetaminophen 5-325 MG tablet Commonly known as:  ROXICET Take 1 tablet by mouth every 4 (four) hours as needed for severe pain.   polyethylene glycol powder powder Commonly known as:  GLYCOLAX/MIRALAX Take 17 g by mouth daily.   potassium chloride SA 20 MEQ tablet Commonly known as:  KLOR-CON M20 Take 1 tablet (20 mEq total) by mouth daily.   pravastatin 10 MG tablet Commonly known as:  PRAVACHOL Take 10 mg by mouth once a week. On sunday   sennosides-docusate sodium 8.6-50 MG tablet Commonly known as:  SENOKOT-S Take 2 tablets by mouth as needed.   white petrolatum ointment Apply topically to dry  skin/heels as needed. May keep at bedside       Review of Systems  Constitutional: Negative for activity change, appetite change, chills, diaphoresis and fever.  HENT: Negative for congestion, mouth sores, nosebleeds, postnasal drip, sneezing, sore throat, trouble swallowing and voice change.   Respiratory: Negative for apnea, cough, choking, chest tightness, shortness of breath and wheezing.   Cardiovascular: Negative for chest pain, palpitations and leg swelling.  Gastrointestinal: Negative for abdominal distention, abdominal pain, constipation, diarrhea and nausea.  Genitourinary: Negative for difficulty urinating, dysuria, frequency and urgency.  Musculoskeletal: Positive for arthralgias (typical arthritis), back pain, gait problem and myalgias.  Skin: Negative for color change, pallor, rash and  wound.  Neurological: Positive for weakness. Negative for dizziness, tremors, syncope, speech difficulty, numbness and headaches.  Psychiatric/Behavioral: Positive for confusion (at times) and dysphoric mood. Negative for agitation and behavioral problems.  All other systems reviewed and are negative.   Immunization History  Administered Date(s) Administered  . PPD Test 01/09/2017   Pertinent  Health Maintenance Due  Topic Date Due  . FOOT EXAM  04/29/1944  . OPHTHALMOLOGY EXAM  04/29/1944  . DEXA SCAN  04/29/2001  . PNA vac Low Risk Adult (1 of 2 - PCV13) 04/29/2001  . INFLUENZA VACCINE  06-25-2018 (Originally 04/18/2018)  . HEMOGLOBIN A1C  01/21/2018   No flowsheet data found. Functional Status Survey:    Vitals:   11/14/17 1134  BP: (!) 97/49  Pulse: 60  Resp: 20  Temp: 98.5 F (36.9 C)  TempSrc: Oral  SpO2: 94%  Weight: 122 lb (55.3 kg)  Height: 5\' 2"  (1.575 m)   Body mass index is 22.31 kg/m. Physical Exam  Constitutional: She is oriented to person, place, and time. Vital signs are normal. She appears well-developed and well-nourished. She is active and cooperative.  She does not appear ill. No distress.  HENT:  Head: Normocephalic and atraumatic.  Mouth/Throat: Uvula is midline, oropharynx is clear and moist and mucous membranes are normal. Mucous membranes are not pale, not dry and not cyanotic.  Eyes: Pupils are equal, round, and reactive to light. Conjunctivae, EOM and lids are normal.  Neck: Trachea normal, normal range of motion and full passive range of motion without pain. Neck supple. No JVD present. No tracheal deviation, no edema and no erythema present. No thyromegaly present.  Cardiovascular: Normal rate, regular rhythm, normal heart sounds, intact distal pulses and normal pulses. Exam reveals no gallop, no distant heart sounds and no friction rub.  No murmur heard. Pulses:      Dorsalis pedis pulses are 2+ on the right side, and 2+ on the left side.  No edema  Pulmonary/Chest: Effort normal and breath sounds normal. No accessory muscle usage. No respiratory distress. She has no decreased breath sounds. She has no wheezes. She has no rhonchi. She has no rales. She exhibits no tenderness.  Abdominal: Soft. Normal appearance and bowel sounds are normal. She exhibits no distension and no ascites. There is no tenderness.  Musculoskeletal: Normal range of motion. She exhibits no edema or tenderness.  Expected osteoarthritis, stiffness; Bilateral Calves soft, supple. Negative Homan's Sign. B- pedal pulses equal; bedbound; refuses to get OOB to the chair most of the time  Neurological: She is alert and oriented to person, place, and time. She has normal strength. She displays atrophy. She exhibits abnormal muscle tone. Coordination and gait abnormal.  Skin: Skin is warm, dry and intact. She is not diaphoretic. No cyanosis. No pallor. Nails show no clubbing.  Psychiatric: Judgment normal. Her affect is angry and blunt. Her speech is delayed. She is agitated (at times), slowed and withdrawn. Thought content is paranoid and delusional. Cognition and memory  are impaired. She exhibits a depressed mood. She exhibits abnormal recent memory.  Nursing note and vitals reviewed.   Labs reviewed: Recent Labs    05/15/17 0525 07/24/17 0620 08/21/17 0606  NA 138 138 139  K 3.9 4.0 4.1  CL 102 99* 104  CO2 28 30 27   GLUCOSE 221* 168* 151*  BUN 14 11 12   CREATININE 0.49 0.44 0.40*  CALCIUM 9.7 9.7 9.2  MG  --  1.9 1.7   Recent Labs  01/07/17 1421 07/24/17 0620 08/21/17 0606  AST 22 17 16   ALT 15 12* 9*  ALKPHOS 102 96 81  BILITOT 0.5 0.7 0.3  PROT 8.3* 7.1 6.5  ALBUMIN 3.9 3.4* 3.0*   Recent Labs    01/07/17 1421 01/08/17 0622 07/24/17 0620 08/21/17 0606  WBC 6.5 5.8 6.1 5.6  NEUTROABS 3.7  --  2.9 2.5  HGB 13.0 11.5* 13.5 13.1  HCT 38.1 33.8* 39.7 37.8  MCV 82.0 82.6 83.3 85.1  PLT 393 356 300 239   Lab Results  Component Value Date   TSH 1.070 08/21/2017   Lab Results  Component Value Date   HGBA1C 8.9 (H) 07/24/2017   No results found for: CHOL, HDL, LDLCALC, LDLDIRECT, TRIG, CHOLHDL  Significant Diagnostic Results in last 30 days:  No results found.  Assessment/Plan Anureet was seen today for medical management of chronic issues.  Diagnoses and all orders for this visit:  Deep tissue injury  Essential (primary) hypertension  Metastatic primary lung cancer, left ([place])   Above listed conditions stable  Continue current medication regimen  Continue to monitor skin integrity carefully  Continue to use Skin prep BID and elevate heels off the bed  Continue use of Prevelon boots as pt allows  Continue to monitor blood pressure and other vitals per protocol  Monitor for worsening pain  Continue to be followed by Palliative Care  Family/ staff Communication:   Total Time:  Documentation:  Face to Face:  Family/Phone:   Labs/tests ordered:  Not due  Medication list reviewed and assessed for continued appropriateness. Monthly medication orders reviewed and signed.  Vikki Ports,  NP-C Geriatrics [facility] [phone], [place] [address] Cell Phone (Mon-Fri 8am-5pm):  [phone] On Call:  [phone] & follow prompts after 5pm & weekends Office Phone:  [phone] Office Fax:  [phone]

## 2017-12-28 NOTE — Assessment & Plan Note (Signed)
Stable, but presumed slowly progressive. No further work up, no treatment. Being followed by Palliative Care.

## 2018-01-15 ENCOUNTER — Non-Acute Institutional Stay (SKILLED_NURSING_FACILITY): Payer: Medicare Other | Admitting: Gerontology

## 2018-01-15 DIAGNOSIS — M6281 Muscle weakness (generalized): Secondary | ICD-10-CM

## 2018-01-15 DIAGNOSIS — Z85118 Personal history of other malignant neoplasm of bronchus and lung: Secondary | ICD-10-CM

## 2018-01-15 DIAGNOSIS — G8929 Other chronic pain: Secondary | ICD-10-CM

## 2018-01-15 DIAGNOSIS — R51 Headache: Secondary | ICD-10-CM

## 2018-01-15 NOTE — Assessment & Plan Note (Signed)
Progressive. Pt refuses to get OOB to chair, though she insists she can still walk if she wanted to.

## 2018-01-15 NOTE — Assessment & Plan Note (Signed)
Progressive/ recurrent. Pt c/o frontal headache. Thinks it's related to her cancer. Most likely r/t sinus/ seasonal allergies- no other s/s of brain mets. Pt refused medications other than tylenol for the headache. Will increase the Prozac for H/A/ anxiety.

## 2018-01-15 NOTE — Assessment & Plan Note (Signed)
Progressive. Inoperable. No treatment. Likely metastatic. Pt appears to be getting weaker. Hospice referral initiated by Dr Ouida Sills.

## 2018-01-15 NOTE — Progress Notes (Signed)
Location:      Place of Service:  SNF (31) Provider:  Toni Arthurs, NP-C  Juluis Pitch, MD  Patient Care Team: Juluis Pitch, MD as PCP - General [facility] Medicine)  Extended Emergency Contact Information Primary Emergency Contact: [facility] Address: [address]          [place]          [place], [place] [address] Johnnette Litter of [place] Phone: [phone] Relation: Friend Secondary Emergency Contact: Francena Hanly [place] of [place] Phone: [phone] Relation: Friend  Code Status:  DNR Goals of care: Advanced Directive information Advanced Directives 11/14/2017  Does Patient Have a Medical Advance Directive? Yes  Type of Advance Directive Out of facility DNR (pink MOST or yellow form)  Does patient want to make changes to medical advance directive? No - Patient declined  Copy of [place] in Chart? -  Would patient like information on creating a medical advance directive? -  Pre-existing out of facility DNR order (yellow form or pink MOST form) Yellow form placed in chart (order not valid for inpatient use)     Chief Complaint  Patient presents with  . Medical Management of Chronic Issues    HPI:  Pt is a 84 y.o. female seen today for medical management of chronic diseases.    Personal history of malignant neoplasm of bronchus and lung Progressive. Inoperable. No treatment. Likely metastatic. Pt appears to be getting weaker. Hospice referral initiated by Dr Ouida Sills.  Muscle weakness (generalized) Progressive. Pt refuses to get OOB to chair, though she insists she can still walk if she wanted to.   Chronic headaches Progressive/ recurrent. Pt c/o frontal headache. Thinks it's related to her cancer. Most likely r/t sinus/ seasonal allergies- no other s/s of brain mets. Pt refused medications other than tylenol for the headache. Will increase the Prozac for H/A/ anxiety.    Past Medical History:  Diagnosis Date  .  Arthritis   . Back pain   . Cancer ([place])    Lung  . Diabetes mellitus without complication ([place])   . Diabetes mellitus, type 2 ([place])   . Hypertension   . Lumbar stenosis   . Osteoarthritis    Past Surgical History:  Procedure Laterality Date  . ABDOMINAL HYSTERECTOMY    . FINE NEEDLE ASPIRATION Left 09/06/2016   Procedure: FINE NEEDLE ASPIRATION; WITH IMAGING GUIDANCE; Surgeon: Marchia Bond, MD; Location: MAIN OR [facility]; Service: Pulmonary  . LAMINOTOMY  10/08/2015  . LUMBAR LAMINECTOMY WITH COFLEX 2 LEVEL Bilateral 09/23/2015   Procedure: Laminectomy and Foraminotomy L4-L5 bilateral with CoFlex L5-S1 - left;  Surgeon: Karie Chimera, MD;  Location: [place] NEURO ORS;  Service: Neurosurgery;  Laterality: Bilateral;  . WOUND EXPLORATION N/A 09/27/2015   Procedure: WOUND EXPLORATION;  Surgeon: Kevan Ny Ditty, MD;  Location: [place] NEURO ORS;  Service: Neurosurgery;  Laterality: N/A;  WOUND EXPLORATION    Allergies  Allergen Reactions  . Tramadol Nausea Only and Nausea And Vomiting    Allergies as of 01/15/2018      Reactions   Tramadol Nausea Only, Nausea And Vomiting      Medication List        Accurate as of 01/15/18 11:16 PM. Always use your most recent med list.          acetaminophen 325 MG tablet Commonly known as:  TYLENOL Take 650 mg by mouth every 6 (six) hours as needed. pain/fever - do not exceed 3053m in 24 hr  period   alum & mag hydroxide-simeth 400-400-40 MG/5ML suspension Commonly known as:  MAALOX PLUS Take 30 mLs by mouth every 4 (four) hours as needed for indigestion.   amLODipine 10 MG tablet Commonly known as:  NORVASC Take 10 mg by mouth daily.   ASPERCREME LIDOCAINE 4 % Ptch Generic drug:  Lidocaine Apply 1 patch topically daily. Apply patch to area of pain on the back. Remove after 12 hours.   carvedilol 25 MG tablet Commonly known as:  COREG Take 1 tablet (25 mg total) by mouth 2 (two) times daily with a meal.   Cholecalciferol 2000 units  Caps Take 1 capsule by mouth daily.   Dextromethorphan-guaiFENesin 10-100 MG/5ML Soln Take 10 mLs by mouth 2 (two) times daily.   divalproex 125 MG DR tablet Commonly known as:  DEPAKOTE Take 250 mg by mouth 2 (two) times daily.   FLUoxetine 40 MG capsule Commonly known as:  PROZAC Take 40 mg by mouth daily. Diabetic Neuropathy/ leg pain (also for the depression and agitation/ irritability- but emphasize it is for the LEG PAIN   FLUoxetine 10 MG capsule Commonly known as:  PROZAC Take 10 mg by mouth daily.   gabapentin 400 MG capsule Commonly known as:  NEURONTIN Take 400 mg by mouth every 8 (eight) hours.   GLUCERNA Liqd Take 237 mLs by mouth 2 (two) times daily.   lisinopril 10 MG tablet Commonly known as:  PRINIVIL,ZESTRIL Take 10 mg by mouth daily.   magnesium hydroxide 400 MG/5ML suspension Commonly known as:  MILK OF MAGNESIA Take 30 mLs by mouth every 4 (four) hours as needed for mild constipation. If Constipation/ no BM for 2 days   Menthol 2.7 MG Lozg Use as directed 1 lozenge in the mouth or throat every 2 (two) hours as needed.   mirtazapine 15 MG tablet Commonly known as:  REMERON Take 15 mg by mouth at bedtime.   ondansetron 4 MG tablet Commonly known as:  ZOFRAN Take 4 mg by mouth every 6 (six) hours as needed for nausea or vomiting.   oxyCODONE-acetaminophen 5-325 MG tablet Commonly known as:  PERCOCET/ROXICET Take 1 tablet by mouth every 4 (four) hours as needed for severe pain.   polyethylene glycol powder powder Commonly known as:  GLYCOLAX/MIRALAX Take 17 g by mouth daily.   potassium chloride SA 20 MEQ tablet Commonly known as:  KLOR-CON M20 Take 1 tablet (20 mEq total) by mouth daily.   pravastatin 10 MG tablet Commonly known as:  PRAVACHOL Take 10 mg by mouth once a week. On sunday   sennosides-docusate sodium 8.6-50 MG tablet Commonly known as:  SENOKOT-S Take 2 tablets by mouth 2 (two) times daily as needed.   white petrolatum  ointment Apply topically to dry skin/heels as needed. May keep at bedside       Review of Systems  Constitutional: Negative for activity change, appetite change, chills, diaphoresis and fever.  HENT: Negative for congestion, mouth sores, nosebleeds, postnasal drip, sneezing, sore throat, trouble swallowing and voice change.   Respiratory: Negative for apnea, cough, choking, chest tightness, shortness of breath and wheezing.   Cardiovascular: Negative for chest pain, palpitations and leg swelling.  Gastrointestinal: Negative for abdominal distention, abdominal pain, constipation, diarrhea and nausea.  Genitourinary: Negative for difficulty urinating, dysuria, frequency and urgency.  Musculoskeletal: Positive for arthralgias (typical arthritis) and gait problem. Negative for back pain and myalgias.  Skin: Negative for color change, pallor, rash and wound.  Neurological: Positive for weakness and headaches.  Negative for dizziness, tremors, syncope, speech difficulty and numbness.  Psychiatric/Behavioral: Positive for dysphoric mood. Negative for agitation and behavioral problems.  All other systems reviewed and are negative.   Immunization History  Administered Date(s) Administered  . PPD Test 01/09/2017   Pertinent  Health Maintenance Due  Topic Date Due  . FOOT EXAM  04/29/1944  . OPHTHALMOLOGY EXAM  04/29/1944  . DEXA SCAN  04/29/2001  . PNA vac Low Risk Adult (1 of 2 - PCV13) 04/29/2001  . INFLUENZA VACCINE  July 18, 2018 (Originally 04/18/2018)  . HEMOGLOBIN A1C  01/21/2018   No flowsheet data found. Functional Status Survey:    Vitals:   01/14/18 1100  BP: (!) 115/56  Pulse: 64  Resp: 17  Temp: 98.4 F (36.9 C)  SpO2: 94%  Weight: 123 lb (55.8 kg)   Body mass index is 22.5 kg/m. Physical Exam  Constitutional: Vital signs are normal. She appears well-developed and well-nourished. She is active and cooperative. She does not appear ill. No distress.  HENT:  Head:  Normocephalic and atraumatic.  Mouth/Throat: Uvula is midline, oropharynx is clear and moist and mucous membranes are normal. Mucous membranes are not pale, not dry and not cyanotic.  Eyes: Pupils are equal, round, and reactive to light. Conjunctivae, EOM and lids are normal.  Neck: Trachea normal, normal range of motion and full passive range of motion without pain. Neck supple. No JVD present. No tracheal deviation, no edema and no erythema present. No thyromegaly present.  Cardiovascular: Normal rate, regular rhythm, normal heart sounds, intact distal pulses and normal pulses. Exam reveals no gallop, no distant heart sounds and no friction rub.  No murmur heard. Pulses:      Dorsalis pedis pulses are 2+ on the right side, and 2+ on the left side.  No edema  Pulmonary/Chest: Effort normal and breath sounds normal. No accessory muscle usage. No respiratory distress. She has no decreased breath sounds. She has no wheezes. She has no rhonchi. She has no rales. She exhibits no tenderness.  Abdominal: Soft. Normal appearance and bowel sounds are normal. She exhibits no distension and no ascites. There is no tenderness.  Musculoskeletal: Normal range of motion. She exhibits no edema or tenderness.  Expected osteoarthritis, stiffness; Bilateral Calves soft, supple. Negative Homan's Sign. B- pedal pulses equal; generalized weakness, mobile with mechanical lift  Neurological: She is alert. She has normal strength. She displays atrophy. She exhibits abnormal muscle tone. Coordination and gait abnormal.  Skin: Skin is warm, dry and intact. She is not diaphoretic. No cyanosis. No pallor. Nails show no clubbing.  Psychiatric: Judgment normal. Her affect is blunt. Her speech is delayed. She is withdrawn. Thought content is paranoid and delusional (at times). Cognition and memory are impaired. She exhibits a depressed mood. She exhibits abnormal recent memory.  Nursing note and vitals reviewed.   Labs  reviewed: Recent Labs    05/15/17 0525 07/24/17 0620 08/21/17 0606  NA 138 138 139  K 3.9 4.0 4.1  CL 102 99* 104  CO2 _0 GLUCOSE 221* 168* 151*  BUN _1 CREATININE 0.49 0.44 0.40*  CALCIUM 9.7 9.7 9.2  MG  --  1.9 1.7   Recent Labs    07/24/17 0620 08/21/17 0606  AST 17 16  ALT 12* 9*  ALKPHOS 96 81  BILITOT 0.7 0.3  PROT 7.1 6.5  ALBUMIN 3.4* 3.0*   Recent Labs    07/24/17 0620 08/21/17 0606  WBC 6.1 5.6  NEUTROABS 2.9 2.5  HGB 13.5 13.1  HCT 39.7 37.8  MCV 83.3 85.1  PLT 300 239   Lab Results  Component Value Date   TSH 1.070 08/21/2017   Lab Results  Component Value Date   HGBA1C 8.9 (H) 07/24/2017   No results found for: CHOL, HDL, LDLCALC, LDLDIRECT, TRIG, CHOLHDL  Significant Diagnostic Results in last 30 days:  No results found.  Assessment/Plan Ruth Rhodes was seen today for medical management of chronic issues.  Diagnoses and all orders for this visit:  Personal history of malignant neoplasm of bronchus and lung  Muscle weakness (generalized)  Chronic intractable headache, unspecified headache type   Above listed conditions progressive/ persistent  Continue current medication regimen, except  Increase Prozac to 60 mg po Q Day  Check labs for electrolytes, hgb and albumin  Continue to encourage pt interaction with other residents and participation in activities  Encourage pt to get OOB to chair daily  Safety precautions  Fall precautions  Hospice referral  Family/ staff Communication:   Total Time:  Documentation:  Face to Face:  Family/Phone:   Labs/tests ordered:  Cbc, met c   Medication list reviewed and assessed for continued appropriateness. Monthly medication orders reviewed and signed.  Ruth Ports, NP-C Geriatrics [facility] [phone], [place] [address] Cell Phone (Mon-Fri 8am-5pm):  [phone] On Call:  [phone] & follow prompts after  5pm & weekends Office Phone:  [phone] Office Fax:  [phone]

## 2018-01-16 ENCOUNTER — Encounter
Admission: RE | Admit: 2018-01-16 | Discharge: 2018-01-16 | Disposition: A | Discharge status: Home / Self Care | Payer: Medicare Other | Source: Ambulatory Visit | Attending: Internal Medicine | Admitting: Internal Medicine

## 2018-02-08 ENCOUNTER — Non-Acute Institutional Stay (SKILLED_NURSING_FACILITY): Payer: Medicare Other | Admitting: Gerontology

## 2018-02-08 ENCOUNTER — Encounter: Payer: Self-pay | Admitting: Gerontology

## 2018-02-08 DIAGNOSIS — F4323 Adjustment disorder with mixed anxiety and depressed mood: Secondary | ICD-10-CM | POA: Diagnosis not present

## 2018-02-08 DIAGNOSIS — E1140 Type 2 diabetes mellitus with diabetic neuropathy, unspecified: Secondary | ICD-10-CM

## 2018-02-08 DIAGNOSIS — M1990 Unspecified osteoarthritis, unspecified site: Secondary | ICD-10-CM

## 2018-02-16 ENCOUNTER — Encounter
Admission: RE | Admit: 2018-02-16 | Discharge: 2018-02-16 | Disposition: A | Discharge status: Home / Self Care | Payer: Medicare Other | Source: Ambulatory Visit | Attending: Internal Medicine | Admitting: Internal Medicine

## 2018-03-04 NOTE — Assessment & Plan Note (Signed)
Stable. No further c/o worsening pain. On PRN Percocet and prn tylenol with scheduled neurontin

## 2018-03-04 NOTE — Assessment & Plan Note (Addendum)
Stable. Symptoms seem improved with last increase in Prozac. Symptoms controlled on 60 mg po Q Day and Depakote 250 mg TID.

## 2018-03-04 NOTE — Assessment & Plan Note (Signed)
Stable. No ongoing monitoring of BGs. Neuropathy managed with Prozac and Neurontin. No further c/o pain

## 2018-03-04 NOTE — Progress Notes (Signed)
Location:    Nursing Home Room Number: 303A Place of Service:  SNF (31) Provider:  Toni Arthurs, NP-C  Juluis Pitch, MD  Patient Care Team: Juluis Pitch, MD as PCP - General [facility] Medicine)  Extended Emergency Contact Information Primary Emergency Contact: [facility] Address: [address]          [place]          [place], [place] [address] Johnnette Litter of [place] Phone: [phone] Relation: Friend Secondary Emergency Contact: Francena Hanly States of [place] Phone: [phone] Relation: Friend  Code Status:  DNR Goals of care: Advanced Directive information Advanced Directives 02/08/2018  Does Patient Have a Medical Advance Directive? Yes  Type of Advance Directive Out of facility DNR (pink MOST or yellow form)  Does patient want to make changes to medical advance directive? No - Patient declined  Copy of [place] in Chart? -  Would patient like information on creating a medical advance directive? -  Pre-existing out of facility DNR order (yellow form or pink MOST form) Yellow form placed in chart (order not valid for inpatient use)     Chief Complaint  Patient presents with  . Medical Management of Chronic Issues    Routine Visit    HPI:  Pt is a 84 y.o. female seen today for medical management of chronic diseases.    Type 2 diabetes mellitus with diabetic neuropathy, unspecified (HCC) Stable. No ongoing monitoring of BGs. Neuropathy managed with Prozac and Neurontin. No further c/o pain  Osteoarthritis Stable. No further c/o worsening pain. On PRN Percocet and prn tylenol with scheduled neurontin  Adjustment disorder with mixed anxiety and depressed mood Stable. Symptoms seem improved with last increase in Prozac. Symptoms controlled on 60 mg po Q Day and Depakote 250 mg TID.    Past Medical History:  Diagnosis Date  . Arthritis   . Back pain   . Cancer ([place])    Lung  . Diabetes mellitus without  complication ([place])   . Diabetes mellitus, type 2 ([place])   . Hypertension   . Lumbar stenosis   . Osteoarthritis    Past Surgical History:  Procedure Laterality Date  . ABDOMINAL HYSTERECTOMY    . FINE NEEDLE ASPIRATION Left 09/06/2016   Procedure: FINE NEEDLE ASPIRATION; WITH IMAGING GUIDANCE; Surgeon: Marchia Bond, MD; Location: MAIN OR [facility]; Service: Pulmonary  . LAMINOTOMY  10/08/2015  . LUMBAR LAMINECTOMY WITH COFLEX 2 LEVEL Bilateral 09/23/2015   Procedure: Laminectomy and Foraminotomy L4-L5 bilateral with CoFlex L5-S1 - left;  Surgeon: Karie Chimera, MD;  Location: [place] NEURO ORS;  Service: Neurosurgery;  Laterality: Bilateral;  . WOUND EXPLORATION N/A 09/27/2015   Procedure: WOUND EXPLORATION;  Surgeon: Kevan Ny Ditty, MD;  Location: [place] NEURO ORS;  Service: Neurosurgery;  Laterality: N/A;  WOUND EXPLORATION    Allergies  Allergen Reactions  . Tramadol Nausea Only and Nausea And Vomiting    Allergies as of 02/08/2018      Reactions   Tramadol Nausea Only, Nausea And Vomiting      Medication List        Accurate as of 02/08/18 11:59 PM. Always use your most recent med list.          acetaminophen 325 MG tablet Commonly known as:  TYLENOL Take 650 mg by mouth every 6 (six) hours as needed. pain/fever - do not exceed 3000mg  in 24 hr period   alum & mag hydroxide-simeth 400-400-40 MG/5ML suspension Commonly known as:  MAALOX PLUS Take 30 mLs by mouth every 4 (four) hours as needed for indigestion.   amLODipine 10 MG tablet Commonly known as:  NORVASC Take 10 mg by mouth daily.   ASPERCREME LIDOCAINE 4 % Ptch Generic drug:  Lidocaine Apply 1 patch topically daily. Apply patch to area of pain on the back. Remove after 12 hours.   carvedilol 25 MG tablet Commonly known as:  COREG Take 1 tablet (25 mg total) by mouth 2 (two) times daily with a meal.   Cholecalciferol 2000 units Caps Take 1 capsule by mouth daily.   Dextromethorphan-guaiFENesin 10-100 MG/5ML  Soln Take 10 mLs by mouth 2 (two) times daily.   divalproex 125 MG DR tablet Commonly known as:  DEPAKOTE Take 250 mg by mouth 3 (three) times daily.   FLUoxetine 40 MG capsule Commonly known as:  PROZAC Take 40 mg by mouth daily. Diabetic Neuropathy/ leg pain (also for the depression and agitation/ irritability- but emphasize it is for the LEG PAIN   FLUoxetine 20 MG tablet Commonly known as:  PROZAC Take 20 mg by mouth daily. Give in addition to 40 mg capsule for a total of 60 mg   gabapentin 400 MG capsule Commonly known as:  NEURONTIN Take 400 mg by mouth every 8 (eight) hours.   GLUCERNA Liqd Take 237 mLs by mouth 2 (two) times daily.   lisinopril 10 MG tablet Commonly known as:  PRINIVIL,ZESTRIL Take 10 mg by mouth daily.   magnesium hydroxide 400 MG/5ML suspension Commonly known as:  MILK OF MAGNESIA Take 30 mLs by mouth every 4 (four) hours as needed for mild constipation. If Constipation/ no BM for 2 days   Menthol 2.7 MG Lozg Use as directed 1 lozenge in the mouth or throat every 2 (two) hours as needed.   mirtazapine 15 MG tablet Commonly known as:  REMERON Take 15 mg by mouth at bedtime.   ondansetron 4 MG tablet Commonly known as:  ZOFRAN Take 4 mg by mouth every 6 (six) hours as needed for nausea or vomiting.   oxyCODONE-acetaminophen 5-325 MG tablet Commonly known as:  PERCOCET/ROXICET Take 1 tablet by mouth every 4 (four) hours as needed for severe pain.   polyethylene glycol powder powder Commonly known as:  GLYCOLAX/MIRALAX Take 17 g by mouth daily.   potassium chloride SA 20 MEQ tablet Commonly known as:  KLOR-CON M20 Take 1 tablet (20 mEq total) by mouth daily.   pravastatin 10 MG tablet Commonly known as:  PRAVACHOL Take 10 mg by mouth once a week. On sunday   sennosides-docusate sodium 8.6-50 MG tablet Commonly known as:  SENOKOT-S Take 2 tablets by mouth 2 (two) times daily as needed.   white petrolatum ointment Apply topically to  dry skin/heels as needed. May keep at bedside       Review of Systems  Constitutional: Negative for activity change, appetite change, chills, diaphoresis and fever.  HENT: Negative for congestion, mouth sores, nosebleeds, postnasal drip, sneezing, sore throat, trouble swallowing and voice change.   Respiratory: Negative for apnea, cough, choking, chest tightness, shortness of breath and wheezing.   Cardiovascular: Negative for chest pain, palpitations and leg swelling.  Gastrointestinal: Negative for abdominal distention, abdominal pain, constipation, diarrhea and nausea.  Genitourinary: Negative for difficulty urinating, dysuria, frequency and urgency.  Musculoskeletal: Positive for arthralgias (typical arthritis) and gait problem. Negative for back pain and myalgias.  Skin: Negative for color change, pallor, rash and wound.  Neurological: Positive for weakness. Negative for dizziness,  tremors, syncope, speech difficulty, numbness and headaches.  Psychiatric/Behavioral: Negative for agitation and behavioral problems.  All other systems reviewed and are negative.   Immunization History  Administered Date(s) Administered  . PPD Test 01/09/2017   Pertinent  Health Maintenance Due  Topic Date Due  . FOOT EXAM  04/29/1944  . OPHTHALMOLOGY EXAM  04/29/1944  . DEXA SCAN  04/29/2001  . PNA vac Low Risk Adult (1 of 2 - PCV13) 04/29/2001  . HEMOGLOBIN A1C  01/21/2018  . INFLUENZA VACCINE  2018/07/17 (Originally 04/18/2018)   No flowsheet data found. Functional Status Survey:    Vitals:   02/08/18 1353  BP: (!) 102/59  Pulse: 77  Resp: 18  Temp: 98.5 F (36.9 C)  TempSrc: Oral  SpO2: 98%  Weight: 124 lb (56.2 kg)  Height: 5\' 2"  (1.575 m)   Body mass index is 22.68 kg/m. Physical Exam  Constitutional: She is oriented to person, place, and time. Vital signs are normal. She appears well-developed and well-nourished. She is active and cooperative. She does not appear ill. No  distress.  HENT:  Head: Normocephalic and atraumatic.  Mouth/Throat: Uvula is midline, oropharynx is clear and moist and mucous membranes are normal. Mucous membranes are not pale, not dry and not cyanotic.  Eyes: Pupils are equal, round, and reactive to light. Conjunctivae, EOM and lids are normal.  Neck: Trachea normal, normal range of motion and full passive range of motion without pain. Neck supple. No JVD present. No tracheal deviation, no edema and no erythema present. No thyromegaly present.  Cardiovascular: Normal rate, regular rhythm, normal heart sounds, intact distal pulses and normal pulses. Exam reveals no gallop, no distant heart sounds and no friction rub.  No murmur heard. Pulses:      Dorsalis pedis pulses are 2+ on the right side, and 2+ on the left side.  No edema  Pulmonary/Chest: Effort normal and breath sounds normal. No accessory muscle usage. No respiratory distress. She has no decreased breath sounds. She has no wheezes. She has no rhonchi. She has no rales. She exhibits no tenderness.  Abdominal: Soft. Normal appearance and bowel sounds are normal. She exhibits no distension and no ascites. There is no tenderness.  Musculoskeletal: Normal range of motion. She exhibits no edema or tenderness.  Expected osteoarthritis, stiffness; Bilateral Calves soft, supple. Negative Homan's Sign. B- pedal pulses equal; immobile. B- foot drop. Pt refuses to get OOB  Neurological: She is alert and oriented to person, place, and time. She displays atrophy. She exhibits abnormal muscle tone. Coordination and gait abnormal.  B- foot drop  Skin: Skin is warm, dry and intact. She is not diaphoretic. No cyanosis. No pallor. Nails show no clubbing.  Psychiatric: Her mood appears anxious. Her affect is blunt. Her speech is delayed. She is slowed and withdrawn. Thought content is paranoid (at times) and delusional. Cognition and memory are impaired. She expresses impulsivity and inappropriate  judgment (at times). She exhibits a depressed mood.  Nursing note and vitals reviewed.   Labs reviewed: Recent Labs    05/15/17 0525 07/24/17 0620 08/21/17 0606  NA 138 138 139  K 3.9 4.0 4.1  CL 102 99* 104  CO2 28 30 27   GLUCOSE 221* 168* 151*  BUN 14 11 12   CREATININE 0.49 0.44 0.40*  CALCIUM 9.7 9.7 9.2  MG  --  1.9 1.7   Recent Labs    07/24/17 0620 08/21/17 0606  AST 17 16  ALT 12* 9*  ALKPHOS 96 81  BILITOT 0.7 0.3  PROT 7.1 6.5  ALBUMIN 3.4* 3.0*   Recent Labs    07/24/17 0620 08/21/17 0606  WBC 6.1 5.6  NEUTROABS 2.9 2.5  HGB 13.5 13.1  HCT 39.7 37.8  MCV 83.3 85.1  PLT 300 239   Lab Results  Component Value Date   TSH 1.070 08/21/2017   Lab Results  Component Value Date   HGBA1C 8.9 (H) 07/24/2017   No results found for: CHOL, HDL, LDLCALC, LDLDIRECT, TRIG, CHOLHDL  Significant Diagnostic Results in last 30 days:  No results found.  Assessment/Plan Kosha was seen today for medical management of chronic issues.  Diagnoses and all orders for this visit:  Type 2 diabetes mellitus with diabetic neuropathy, without long-term current use of insulin (HCC)  Osteoarthritis, unspecified osteoarthritis type, unspecified site  Adjustment disorder with mixed anxiety and depressed mood   Above listed conditions stable  Continue current medication regimen  Monitor for s/s of hyper or hypoglycemia  FSBS checks prn  Monitor for worsening pain  Monitor for worsening mood/depressin/anxiety  Continue to encourage pt to be OOB to chair daily  Continue to encourage pt participation in activities and interaction with other residents  Continue to be followed by Hospice services  Family/ staff Communication:   Total Time:  Documentation:  Face to Face:  Family/Phone:   Labs/tests ordered:  Not due. Now on Hospice services  Medication list reviewed and assessed for continued appropriateness. Monthly medication orders reviewed and  signed.  Vikki Ports, NP-C Geriatrics [facility] [phone], [place] [address] Cell Phone (Mon-Fri 8am-5pm):  [phone] On Call:  [phone] & follow prompts after 5pm & weekends Office Phone:  [phone] Office Fax:  [phone]

## 2018-03-15 ENCOUNTER — Encounter: Payer: Self-pay | Admitting: Gerontology

## 2018-03-18 ENCOUNTER — Encounter
Admission: RE | Admit: 2018-03-18 | Discharge: 2018-03-18 | Disposition: A | Discharge status: Home / Self Care | Payer: Medicare Other | Source: Ambulatory Visit | Attending: Internal Medicine | Admitting: Internal Medicine

## 2018-03-19 NOTE — Progress Notes (Signed)
This encounter was created in error - please disregard.

## 2018-04-18 ENCOUNTER — Encounter
Admission: RE | Admit: 2018-04-18 | Discharge: 2018-04-18 | Disposition: A | Discharge status: Home / Self Care | Payer: Medicare Other | Source: Ambulatory Visit | Attending: Internal Medicine | Admitting: Internal Medicine

## 2018-05-17 ENCOUNTER — Other Ambulatory Visit: Payer: Self-pay

## 2018-05-17 MED ORDER — MORPHINE SULFATE (CONCENTRATE) 20 MG/ML PO SOLN: 5.0000 mg | Freq: Four times a day (QID) | ORAL | 0 refills | Status: AC | PRN

## 2018-05-17 NOTE — Telephone Encounter (Signed)
Rx sent to [facility] phone : [phone] , fax : [phone]  

## 2018-05-19 ENCOUNTER — Encounter
Admission: RE | Admit: 2018-05-19 | Discharge: 2018-05-19 | Disposition: A | Discharge status: Home / Self Care | Payer: Medicare Other | Source: Ambulatory Visit | Attending: Internal Medicine | Admitting: Internal Medicine

## 2018-06-18 ENCOUNTER — Encounter: Admission: RE | Admit: 2018-06-18 | Payer: Medicare Other | Source: Ambulatory Visit | Admitting: Internal Medicine

## 2018-07-19 DEATH — deceased
